# Patient Record
Sex: Female | Born: 1975 | Race: Black or African American | Hispanic: No | Marital: Married | Smoking: Never smoker
Health system: Southern US, Community
[De-identification: ages and names within clinical notes are randomized; demographics above are authoritative.]

## PROBLEM LIST (undated history)

## (undated) DIAGNOSIS — F329 Major depressive disorder, single episode, unspecified: Secondary | ICD-10-CM

## (undated) DIAGNOSIS — D219 Benign neoplasm of connective and other soft tissue, unspecified: Secondary | ICD-10-CM

## (undated) DIAGNOSIS — K5792 Diverticulitis of intestine, part unspecified, without perforation or abscess without bleeding: Secondary | ICD-10-CM

## (undated) DIAGNOSIS — Z9289 Personal history of other medical treatment: Secondary | ICD-10-CM

## (undated) DIAGNOSIS — F419 Anxiety disorder, unspecified: Secondary | ICD-10-CM

## (undated) DIAGNOSIS — E119 Type 2 diabetes mellitus without complications: Secondary | ICD-10-CM

## (undated) DIAGNOSIS — N979 Female infertility, unspecified: Secondary | ICD-10-CM

## (undated) DIAGNOSIS — F32A Depression, unspecified: Secondary | ICD-10-CM

## (undated) DIAGNOSIS — I10 Essential (primary) hypertension: Secondary | ICD-10-CM

## (undated) DIAGNOSIS — D649 Anemia, unspecified: Secondary | ICD-10-CM

## (undated) HISTORY — DX: Morbid (severe) obesity due to excess calories: E66.01

## (undated) HISTORY — DX: Diverticulitis of intestine, part unspecified, without perforation or abscess without bleeding: K57.92

## (undated) HISTORY — DX: Benign neoplasm of connective and other soft tissue, unspecified: D21.9

---

## 1999-06-04 HISTORY — PX: BREAST SURGERY: SHX581

## 2007-06-04 HISTORY — PX: MYOMECTOMY: SHX85

## 2013-08-01 DIAGNOSIS — K5792 Diverticulitis of intestine, part unspecified, without perforation or abscess without bleeding: Secondary | ICD-10-CM

## 2013-08-01 HISTORY — DX: Diverticulitis of intestine, part unspecified, without perforation or abscess without bleeding: K57.92

## 2013-08-08 ENCOUNTER — Emergency Department (HOSPITAL_COMMUNITY): Payer: 59

## 2013-08-08 ENCOUNTER — Encounter (HOSPITAL_COMMUNITY): Payer: Self-pay | Admitting: Emergency Medicine

## 2013-08-08 ENCOUNTER — Emergency Department (HOSPITAL_COMMUNITY)
Admission: EM | Admit: 2013-08-08 | Discharge: 2013-08-08 | Disposition: A | Discharge status: Home / Self Care | Payer: 59 | Attending: Emergency Medicine | Admitting: Emergency Medicine

## 2013-08-08 DIAGNOSIS — I10 Essential (primary) hypertension: Secondary | ICD-10-CM | POA: Insufficient documentation

## 2013-08-08 DIAGNOSIS — Z79899 Other long term (current) drug therapy: Secondary | ICD-10-CM | POA: Insufficient documentation

## 2013-08-08 DIAGNOSIS — E119 Type 2 diabetes mellitus without complications: Secondary | ICD-10-CM | POA: Insufficient documentation

## 2013-08-08 DIAGNOSIS — Z792 Long term (current) use of antibiotics: Secondary | ICD-10-CM | POA: Insufficient documentation

## 2013-08-08 DIAGNOSIS — K5792 Diverticulitis of intestine, part unspecified, without perforation or abscess without bleeding: Secondary | ICD-10-CM

## 2013-08-08 DIAGNOSIS — Z3202 Encounter for pregnancy test, result negative: Secondary | ICD-10-CM | POA: Insufficient documentation

## 2013-08-08 DIAGNOSIS — K5732 Diverticulitis of large intestine without perforation or abscess without bleeding: Secondary | ICD-10-CM | POA: Insufficient documentation

## 2013-08-08 HISTORY — DX: Type 2 diabetes mellitus without complications: E11.9

## 2013-08-08 HISTORY — DX: Essential (primary) hypertension: I10

## 2013-08-08 LAB — URINALYSIS, ROUTINE W REFLEX MICROSCOPIC
Bilirubin Urine: NEGATIVE
GLUCOSE, UA: 250 mg/dL — AB
Hgb urine dipstick: NEGATIVE
KETONES UR: NEGATIVE mg/dL
LEUKOCYTES UA: NEGATIVE
Nitrite: NEGATIVE
Protein, ur: NEGATIVE mg/dL
Specific Gravity, Urine: 1.021 (ref 1.005–1.030)
Urobilinogen, UA: 0.2 mg/dL (ref 0.0–1.0)
pH: 5.5 (ref 5.0–8.0)

## 2013-08-08 LAB — CBC WITH DIFFERENTIAL/PLATELET
BASOS ABS: 0 10*3/uL (ref 0.0–0.1)
Basophils Relative: 0 % (ref 0–1)
Eosinophils Absolute: 0.1 10*3/uL (ref 0.0–0.7)
Eosinophils Relative: 0 % (ref 0–5)
HEMATOCRIT: 35.2 % — AB (ref 36.0–46.0)
HEMOGLOBIN: 11 g/dL — AB (ref 12.0–15.0)
LYMPHS ABS: 2.8 10*3/uL (ref 0.7–4.0)
LYMPHS PCT: 22 % (ref 12–46)
MCH: 24.7 pg — ABNORMAL LOW (ref 26.0–34.0)
MCHC: 31.3 g/dL (ref 30.0–36.0)
MCV: 79.1 fL (ref 78.0–100.0)
MONO ABS: 0.7 10*3/uL (ref 0.1–1.0)
Monocytes Relative: 5 % (ref 3–12)
NEUTROS ABS: 9 10*3/uL — AB (ref 1.7–7.7)
Neutrophils Relative %: 72 % (ref 43–77)
Platelets: 375 10*3/uL (ref 150–400)
RBC: 4.45 MIL/uL (ref 3.87–5.11)
RDW: 16.2 % — AB (ref 11.5–15.5)
WBC: 12.5 10*3/uL — AB (ref 4.0–10.5)

## 2013-08-08 LAB — BASIC METABOLIC PANEL
BUN: 7 mg/dL (ref 6–23)
CO2: 28 meq/L (ref 19–32)
CREATININE: 0.71 mg/dL (ref 0.50–1.10)
Calcium: 9.5 mg/dL (ref 8.4–10.5)
Chloride: 97 mEq/L (ref 96–112)
GFR calc Af Amer: >90 mL/min (ref >90)
GFR calc non Af Amer: >90 mL/min (ref >90)
Glucose, Bld: 152 mg/dL — ABNORMAL HIGH (ref 70–99)
Potassium: 4.4 mEq/L (ref 3.7–5.3)
Sodium: 136 mEq/L — ABNORMAL LOW (ref 137–147)

## 2013-08-08 LAB — PREGNANCY, URINE: PREG TEST UR: NEGATIVE

## 2013-08-08 MED ORDER — CIPROFLOXACIN HCL 500 MG PO TABS: 500.0000 mg | ORAL_TABLET | Freq: Two times a day (BID) | ORAL | Status: DC

## 2013-08-08 MED ORDER — OXYCODONE-ACETAMINOPHEN 5-325 MG PO TABS
2.0000 | ORAL_TABLET | Freq: Once | ORAL | Status: AC
  Administered 2013-08-08: 2 via ORAL
  Filled 2013-08-08: qty 2

## 2013-08-08 MED ORDER — METRONIDAZOLE 500 MG PO TABS
500.0000 mg | ORAL_TABLET | Freq: Once | ORAL | Status: AC
  Administered 2013-08-08: 500 mg via ORAL
  Filled 2013-08-08: qty 1

## 2013-08-08 MED ORDER — IOHEXOL 300 MG/ML  SOLN
100.0000 mL | Freq: Once | INTRAMUSCULAR | Status: AC | PRN
  Administered 2013-08-08: 100 mL via INTRAVENOUS

## 2013-08-08 MED ORDER — MORPHINE SULFATE 4 MG/ML IJ SOLN
4.0000 mg | Freq: Once | INTRAMUSCULAR | Status: AC
  Administered 2013-08-08: 4 mg via INTRAVENOUS
  Filled 2013-08-08: qty 1

## 2013-08-08 MED ORDER — IOHEXOL 300 MG/ML  SOLN
50.0000 mL | Freq: Once | INTRAMUSCULAR | Status: AC | PRN
  Administered 2013-08-08: 50 mL via ORAL

## 2013-08-08 MED ORDER — CIPROFLOXACIN HCL 500 MG PO TABS
500.0000 mg | ORAL_TABLET | Freq: Once | ORAL | Status: AC
  Administered 2013-08-08: 500 mg via ORAL
  Filled 2013-08-08: qty 1

## 2013-08-08 MED ORDER — ONDANSETRON HCL 4 MG/2ML IJ SOLN
4.0000 mg | Freq: Once | INTRAMUSCULAR | Status: AC
  Administered 2013-08-08: 4 mg via INTRAVENOUS
  Filled 2013-08-08: qty 2

## 2013-08-08 MED ORDER — SODIUM CHLORIDE 0.9 % IV BOLUS (SEPSIS)
1000.0000 mL | Freq: Once | INTRAVENOUS | Status: AC
  Administered 2013-08-08: 1000 mL via INTRAVENOUS

## 2013-08-08 MED ORDER — ONDANSETRON HCL 4 MG PO TABS: 4.0000 mg | ORAL_TABLET | Freq: Four times a day (QID) | ORAL | Status: DC

## 2013-08-08 MED ORDER — HYDROCODONE-ACETAMINOPHEN 5-325 MG PO TABS: 1.0000 | ORAL_TABLET | ORAL | Status: DC | PRN

## 2013-08-08 MED ORDER — METRONIDAZOLE 500 MG PO TABS: 500.0000 mg | ORAL_TABLET | Freq: Three times a day (TID) | ORAL | Status: DC

## 2013-08-08 NOTE — Discharge Instructions (Signed)
Diverticulitis °A diverticulum is a small pouch or sac on the colon. Diverticulosis is the presence of these diverticula on the colon. Diverticulitis is the irritation (inflammation) or infection of diverticula. °CAUSES  °The colon and its diverticula contain bacteria. If food particles block the tiny opening to a diverticulum, the bacteria inside can grow and cause an increase in pressure. This leads to infection and inflammation and is called diverticulitis. °SYMPTOMS  °· Abdominal pain and tenderness. Usually, the pain is located on the left side of your abdomen. However, it could be located elsewhere. °· Fever. °· Bloating. °· Feeling sick to your stomach (nausea). °· Throwing up (vomiting). °· Abnormal stools. °DIAGNOSIS  °Your caregiver will take a history and perform a physical exam. Since many things can cause abdominal pain, other tests may be necessary. Tests may include: °· Blood tests. °· Urine tests. °· X-ray of the abdomen. °· CT scan of the abdomen. °Sometimes, surgery is needed to determine if diverticulitis or other conditions are causing your symptoms. °TREATMENT  °Most of the time, you can be treated without surgery. Treatment includes: °· Resting the bowels by only having liquids for a few days. As you improve, you will need to eat a low-fiber diet. °· Intravenous (IV) fluids if you are losing body fluids (dehydrated). °· Antibiotic medicines that treat infections may be given. °· Pain and nausea medicine, if needed. °· Surgery if the inflamed diverticulum has burst. °HOME CARE INSTRUCTIONS  °· Try a clear liquid diet (broth, tea, or water for as long as directed by your caregiver). You may then gradually begin a low-fiber diet as tolerated.  °A low-fiber diet is a diet with less than 10 grams of fiber. Choose the foods below to reduce fiber in the diet: °· Sperry breads, cereals, rice, and pasta. °· Cooked fruits and vegetables or soft fresh fruits and vegetables without the skin. °· Ground or  well-cooked tender beef, ham, veal, lamb, pork, or poultry. °· Eggs and seafood. °· After your diverticulitis symptoms have improved, your caregiver may put you on a high-fiber diet. A high-fiber diet includes 14 grams of fiber for every 1000 calories consumed. For a standard 2000 calorie diet, you would need 28 grams of fiber. Follow these diet guidelines to help you increase the fiber in your diet. It is important to slowly increase the amount fiber in your diet to avoid gas, constipation, and bloating. °· Choose whole-grain breads, cereals, pasta, and brown rice. °· Choose fresh fruits and vegetables with the skin on. Do not overcook vegetables because the more vegetables are cooked, the more fiber is lost. °· Choose more nuts, seeds, legumes, dried peas, beans, and lentils. °· Look for food products that have greater than 3 grams of fiber per serving on the Nutrition Facts label. °· Take all medicine as directed by your caregiver. °· If your caregiver has given you a follow-up appointment, it is very important that you go. Not going could result in lasting (chronic) or permanent injury, pain, and disability. If there is any problem keeping the appointment, call to reschedule. °SEEK MEDICAL CARE IF:  °· Your pain does not improve. °· You have a hard time advancing your diet beyond clear liquids. °· Your bowel movements do not return to normal. °SEEK IMMEDIATE MEDICAL CARE IF:  °· Your pain becomes worse. °· You have an oral temperature above 102° F (38.9° C), not controlled by medicine. °· You have repeated vomiting. °· You have bloody or black, tarry stools. °·   Symptoms that brought you to your caregiver become worse or are not getting better. °MAKE SURE YOU:  °· Understand these instructions. °· Will watch your condition. °· Will get help right away if you are not doing well or get worse. °Document Released: 02/27/2005 Document Revised: 08/12/2011 Document Reviewed: 06/25/2010 °[employer]® Patient Information  ©2014 [employer]. ° °

## 2013-08-08 NOTE — ED Provider Notes (Signed)
Medical screening examination/treatment/procedure(s) were performed by non-physician practitioner and as supervising physician I was immediately available for consultation/collaboration.   EKG Interpretation None        Blanchard Kelch, MD 08/08/13 2129

## 2013-08-08 NOTE — ED Provider Notes (Signed)
CSN: [id number]     Arrival date & time 08/08/13  2725 History   First MD Initiated Contact with Patient 08/08/13 [phone]     Chief Complaint  Patient presents with  . Abdominal Pain     (Consider location/radiation/quality/duration/timing/severity/associated sxs/prior Treatment) HPI  Patient to the ER with complaints of abdominal pains that started yesterday evening. THe pains have been worsening and this morning they woke her out of her sleep. She has recently changed her diet to vegetables, probiotics, fiber, and other healthy foods. She has been experiencing gas, bloating and looser stool. This morning she had a bowel movement but it was very hard and smaller than normal. She feels as though she is constipated and backed up. She has been nauseous but has not vomited. She denies having any fevers and the pain is across her bilateral lower abdomen. Denies dysuria, hematuria, irregular vaginal discharge or bleeding. Pain is currently 7/10.  Past Medical History  Diagnosis Date  . Diabetes mellitus without complication   . Hypertension    Past Surgical History  Procedure Laterality Date  . Breast surgery      reconstruction   History reviewed. No pertinent family history. History  Substance Use Topics  . Smoking status: Never Smoker   . Smokeless tobacco: Not on file  . Alcohol Use: No   OB History   Grav Para Term Preterm Abortions TAB SAB Ect Mult Living                 Review of Systems  The patient denies anorexia, fever, weight loss,, vision loss, decreased hearing, hoarseness, chest pain, syncope, dyspnea on exertion, peripheral edema, balance deficits, hemoptysis, melena, hematochezia, severe indigestion/heartburn, hematuria, incontinence, genital sores, muscle weakness, suspicious skin lesions, transient blindness, difficulty walking, depression, unusual weight change, abnormal bleeding, enlarged lymph nodes, angioedema, and breast masses.   Allergies  Codeine  Home  Medications   Current Outpatient Rx  Name  Route  Sig  Dispense  Refill  . losartan-hydrochlorothiazide (HYZAAR) 100-25 MG per tablet   Oral   Take 1 tablet by mouth daily.         . metFORMIN (GLUCOPHAGE) 1000 MG tablet   Oral   Take 1,000 mg by mouth 2 (two) times daily with a meal.         . OVER THE COUNTER MEDICATION   Oral   Take 1 tablet by mouth daily. Schiff Probiotic Gummy         . ciprofloxacin (CIPRO) 500 MG tablet   Oral   Take 1 tablet (500 mg total) by mouth 2 (two) times daily.   28 tablet   0   . HYDROcodone-acetaminophen (NORCO/VICODIN) 5-325 MG per tablet   Oral   Take 1-2 tablets by mouth every 4 (four) hours as needed.   25 tablet   0   . metroNIDAZOLE (FLAGYL) 500 MG tablet   Oral   Take 1 tablet (500 mg total) by mouth 3 (three) times daily.   42 tablet   0   . ondansetron (ZOFRAN) 4 MG tablet   Oral   Take 1 tablet (4 mg total) by mouth every 6 (six) hours.   12 tablet   0    BP 139/78  Pulse 70  Temp(Src) 98.2 F (36.8 C) (Oral)  Resp 18  Ht 5\' 6"  (1.676 m)  Wt 346 lb (156.945 kg)  BMI 55.87 kg/m2  SpO2 95%  LMP 08/02/2013 Physical Exam  Nursing note and vitals reviewed.  Constitutional: She appears well-developed and well-nourished. No distress.  HENT:  Head: Normocephalic and atraumatic.  Eyes: Pupils are equal, round, and reactive to light.  Neck: Normal range of motion. Neck supple.  Cardiovascular: Normal rate and regular rhythm.   Pulmonary/Chest: Effort normal.  Abdominal: Soft. Bowel sounds are normal. She exhibits no distension and no ascites. There is tenderness in the right lower quadrant, suprapubic area and left lower quadrant. There is no rebound and no guarding.  Exam limited by body habitus (AFTER PAIN MEDICATION PAIN LOCALIZED TO LLQ)  Neurological: She is alert.  Skin: Skin is warm and dry.    ED Course  Procedures (including critical care time) Labs Review Labs Reviewed  CBC WITH DIFFERENTIAL -  Abnormal; Notable for the following:    WBC 12.5 (*)    Hemoglobin 11.0 (*)    HCT 35.2 (*)    MCH 24.7 (*)    RDW 16.2 (*)    Neutro Abs 9.0 (*)    All other components within normal limits  BASIC METABOLIC PANEL - Abnormal; Notable for the following:    Sodium 136 (*)    Glucose, Bld 152 (*)    All other components within normal limits  URINALYSIS, ROUTINE W REFLEX MICROSCOPIC - Abnormal; Notable for the following:    APPearance CLOUDY (*)    Glucose, UA 250 (*)    All other components within normal limits  PREGNANCY, URINE   Imaging Review Ct Abdomen Pelvis W Contrast  08/08/2013   CLINICAL DATA:  Abdominal pain.  EXAM: CT ABDOMEN AND PELVIS WITH CONTRAST  TECHNIQUE: Multidetector CT imaging of the abdomen and pelvis was performed using the standard protocol following bolus administration of intravenous contrast.  CONTRAST:  175mL OMNIPAQUE IOHEXOL 300 MG/ML  SOLN  COMPARISON:  DG ABD 2 VIEWS dated 08/08/2013  FINDINGS: Few patchy densities at the lung bases are suggestive for atelectasis. Negative for free air.  Normal appearance of the liver, portal venous system and gallbladder. Normal appearance of the pancreas, spleen, adrenal glands, stomach and kidneys.  There is a slightly low-density lesion along the anterior uterus. Lesion roughly measures 7.1 x 5.6 x 6.8 cm. Findings are suggestive for a large uterine fibroid. Uterus extends into the lower abdomen. There are low-density structures along the left side of the uterus which appear to be related to the adnexa. Largest low-density structure measures up to 4.2 cm but this also has a tubular configuration and cannot exclude hydrosalpinx. Along the right side of the uterine fundus, there is a heterogeneous lesion that contains fat. The lesion also has a calcification. This lesion measures 3.7 x 2.9 cm and consistent with an ovarian dermoid.  The appendix is adjacent to the dermoid without inflammatory changes. There is pericolonic edema  involving the distal descending colon in the left lower quadrant. There is an indistinct diverticulum in this area and findings are suggestive for acute diverticulitis. There are multiple small diverticula in the left colon. No evidence for an abscess collection. No significant free fluid in the pelvis. Normal appearance of the urinary bladder. No acute bone abnormality.  IMPRESSION: Acute inflammation in the distal descending colon. Findings are most compatible with acute diverticulitis. No evidence for abscess or free fluid.  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  Low-density structures involving the left adnexa could represent ovarian cysts but hydrosalpinx cannot be excluded. The ovarian dermoid and left adnexa could be better characterized with ultrasound.  Probable large fibroid along the anterior aspect  of the uterus.   Electronically Signed   By: Markus Daft M.D.   On: 08/08/2013 10:42   Dg Abd 2 Views  08/08/2013   CLINICAL DATA:  Abdominal pain and cramping.  EXAM: ABDOMEN - 2 VIEW  COMPARISON:  None.  FINDINGS: No evidence for free air. Gas and stool within the colon. No significant small bowel gas. Calcifications in the pelvis are suggestive for phleboliths. Mild degenerative changes in the right hip joint. No large calcifications overlying the renal shadows.  IMPRESSION: Nonspecific bowel gas pattern.   Electronically Signed   By: Markus Daft M.D.   On: 08/08/2013 08:44     EKG Interpretation None      MDM   Final diagnoses:  Diverticulitis    Patients pain easily controlled with pain medication. She looks well, resting calmly in her exam room. She is afebrile, she is not tachycardic, hypoxic, nor does she have any vital sign deficits.   Patient does not need admission at this time. Will put on Flagyl and Cipro for 2 weeks. Give as well as pain and nausea medication.  38 y.o.Olena Vanburen's evaluation in the Emergency Department is complete. It has been determined that no  acute conditions requiring further emergency intervention are present at this time. The patient/guardian have been advised of the diagnosis and plan. We have discussed signs and symptoms that warrant return to the ED, such as changes or worsening in symptoms.  Vital signs are stable at discharge. Filed Vitals:   08/08/13 1040  BP: 139/78  Pulse: 70  Temp:   Resp: 18    Patient/guardian has voiced understanding and agreed to follow-up with the PCP or specialist.       Linus Mako, PA-C 08/08/13 1100

## 2013-08-08 NOTE — ED Notes (Signed)
Pt states that she has been having abdominal pain since yesterday. Pt tried probiotics and still feels like she is bloated after havign a bowel movment and passing gas. Pt states 2 bowel movements today regular but with mucus. Pt also tried ginger ale with no relief.

## 2013-10-11 ENCOUNTER — Emergency Department (HOSPITAL_COMMUNITY)
Admission: EM | Admit: 2013-10-11 | Discharge: 2013-10-11 | Disposition: A | Discharge status: Home / Self Care | Payer: 59 | Source: Home / Self Care | Attending: Family Medicine | Admitting: Family Medicine

## 2013-10-11 ENCOUNTER — Encounter (HOSPITAL_COMMUNITY): Payer: Self-pay | Admitting: Emergency Medicine

## 2013-10-11 DIAGNOSIS — K5900 Constipation, unspecified: Secondary | ICD-10-CM

## 2013-10-11 DIAGNOSIS — K5732 Diverticulitis of large intestine without perforation or abscess without bleeding: Secondary | ICD-10-CM

## 2013-10-11 DIAGNOSIS — K5792 Diverticulitis of intestine, part unspecified, without perforation or abscess without bleeding: Secondary | ICD-10-CM

## 2013-10-11 DIAGNOSIS — R109 Unspecified abdominal pain: Secondary | ICD-10-CM

## 2013-10-11 MED ORDER — METRONIDAZOLE 500 MG PO TABS: 500.0000 mg | ORAL_TABLET | Freq: Three times a day (TID) | ORAL | Status: DC

## 2013-10-11 MED ORDER — POLYETHYLENE GLYCOL 3350 17 GM/SCOOP PO POWD: 17.0000 g | Freq: Every day | ORAL | Status: DC

## 2013-10-11 MED ORDER — CIPROFLOXACIN HCL 500 MG PO TABS: 500.0000 mg | ORAL_TABLET | Freq: Two times a day (BID) | ORAL | Status: DC

## 2013-10-11 NOTE — ED Provider Notes (Signed)
Amy Banks is a 38 y.o. female who presents to Urgent Care today for abdominal pain. Patient developed lower abdominal pain starting yesterday evening. She notes coexisting constipation as well. The pain is mild and consistent with prior episodes of diverticulitis. She denies any fevers or chills nausea vomiting or diarrhea. She tried milk of magnesia to relieve constipation which did not help much. She feels well otherwise. She is currently menstruating.   Past Medical History  Diagnosis Date  . Diabetes mellitus without complication   . Hypertension    History  Substance Use Topics  . Smoking status: Never Smoker   . Smokeless tobacco: Not on file  . Alcohol Use: No   ROS as above Medications: No current facility-administered medications for this encounter.   Current Outpatient Prescriptions  Medication Sig Dispense Refill  . Letrozole (FEMARA PO) Take by mouth.      . losartan-hydrochlorothiazide (HYZAAR) 100-25 MG per tablet Take 1 tablet by mouth daily.      . metFORMIN (GLUCOPHAGE) 1000 MG tablet Take 1,000 mg by mouth 2 (two) times daily with a meal.      . ciprofloxacin (CIPRO) 500 MG tablet Take 1 tablet (500 mg total) by mouth 2 (two) times daily.  28 tablet  0  . HYDROcodone-acetaminophen (NORCO/VICODIN) 5-325 MG per tablet Take 1-2 tablets by mouth every 4 (four) hours as needed.  25 tablet  0  . metroNIDAZOLE (FLAGYL) 500 MG tablet Take 1 tablet (500 mg total) by mouth 3 (three) times daily.  42 tablet  0  . ondansetron (ZOFRAN) 4 MG tablet Take 1 tablet (4 mg total) by mouth every 6 (six) hours.  12 tablet  0  . OVER THE COUNTER MEDICATION Take 1 tablet by mouth daily. Schiff Probiotic Gummy      . polyethylene glycol powder (GLYCOLAX/MIRALAX) powder Take 17 g by mouth daily.  850 g  1    Exam:  BP 121/85  Pulse 66  Temp(Src) 98.4 F (36.9 C) (Oral)  Resp 16  SpO2 98%  LMP 10/08/2013 Gen: Well NAD morbidly obese HEENT: EOMI,  MMM Lungs: Normal work of  breathing. CTABL Heart: RRR no MRG Abd: NABS, Soft. Mildly tender with no rebound or guarding left lower quadrant, ND no CV angle tenderness to percussion Exts: Brisk capillary refill, warm and well perfused.   No results found for this or any previous visit (from the past 24 hour(s)). No results found.  Assessment and Plan: 38 y.o. female with abdominal pain. Concern for diverticulitis versus constipation. Refill antibiotics for diverticulitis. Additionally recommend MiraLAX. Refer to gastroenterology  Discussed warning signs or symptoms. Please see discharge instructions. Patient expresses understanding.    Gregor Hams, MD 10/11/13 [phone]

## 2013-10-11 NOTE — ED Notes (Signed)
Patient c/o diverticulitis, onset last night

## 2013-10-11 NOTE — Discharge Instructions (Signed)
Thank you for coming in today. Restart a clear diet.  Restart antibiotics.  Use condoms when having sex while taking these antibiotics.  Follow up with gastroenterology.   Diverticulitis A diverticulum is a small pouch or sac on the colon. Diverticulosis is the presence of these diverticula on the colon. Diverticulitis is the irritation (inflammation) or infection of diverticula. CAUSES  The colon and its diverticula contain bacteria. If food particles block the tiny opening to a diverticulum, the bacteria inside can grow and cause an increase in pressure. This leads to infection and inflammation and is called diverticulitis. SYMPTOMS   Abdominal pain and tenderness. Usually, the pain is located on the left side of your abdomen. However, it could be located elsewhere.  Fever.  Bloating.  Feeling sick to your stomach (nausea).  Throwing up (vomiting).  Abnormal stools. DIAGNOSIS  Your caregiver will take a history and perform a physical exam. Since many things can cause abdominal pain, other tests may be necessary. Tests may include:  Blood tests.  Urine tests.  X-ray of the abdomen.  CT scan of the abdomen. Sometimes, surgery is needed to determine if diverticulitis or other conditions are causing your symptoms. TREATMENT  Most of the time, you can be treated without surgery. Treatment includes:  Resting the bowels by only having liquids for a few days. As you improve, you will need to eat a low-fiber diet.  Intravenous (IV) fluids if you are losing body fluids (dehydrated).  Antibiotic medicines that treat infections may be given.  Pain and nausea medicine, if needed.  Surgery if the inflamed diverticulum has burst. HOME CARE INSTRUCTIONS   Try a clear liquid diet (broth, tea, or water for as long as directed by your caregiver). You may then gradually begin a low-fiber diet as tolerated.  A low-fiber diet is a diet with less than 10 grams of fiber. Choose the foods  below to reduce fiber in the diet:  Friedmann breads, cereals, rice, and pasta.  Cooked fruits and vegetables or soft fresh fruits and vegetables without the skin.  Ground or well-cooked tender beef, ham, veal, lamb, pork, or poultry.  Eggs and seafood.  After your diverticulitis symptoms have improved, your caregiver may put you on a high-fiber diet. A high-fiber diet includes 14 grams of fiber for every 1000 calories consumed. For a standard 2000 calorie diet, you would need 28 grams of fiber. Follow these diet guidelines to help you increase the fiber in your diet. It is important to slowly increase the amount fiber in your diet to avoid gas, constipation, and bloating.  Choose whole-grain breads, cereals, pasta, and brown rice.  Choose fresh fruits and vegetables with the skin on. Do not overcook vegetables because the more vegetables are cooked, the more fiber is lost.  Choose more nuts, seeds, legumes, dried peas, beans, and lentils.  Look for food products that have greater than 3 grams of fiber per serving on the Nutrition Facts label.  Take all medicine as directed by your caregiver.  If your caregiver has given you a follow-up appointment, it is very important that you go. Not going could result in lasting (chronic) or permanent injury, pain, and disability. If there is any problem keeping the appointment, call to reschedule. SEEK MEDICAL CARE IF:   Your pain does not improve.  You have a hard time advancing your diet beyond clear liquids.  Your bowel movements do not return to normal. SEEK IMMEDIATE MEDICAL CARE IF:   Your pain becomes  worse.  You have an oral temperature above 102 F (38.9 C), not controlled by medicine.  You have repeated vomiting.  You have bloody or black, tarry stools.  Symptoms that brought you to your caregiver become worse or are not getting better. MAKE SURE YOU:   Understand these instructions.  Will watch your condition.  Will get  help right away if you are not doing well or get worse. Document Released: 02/27/2005 Document Revised: 08/12/2011 Document Reviewed: 06/25/2010 [facility] Patient Information 2014 [place].  PRIMARY CARE Paramedic at [place], [place] Ph [phone]  Fax [phone]  Therapist, music at [facility] [address] [place], [place] Ph [phone]  Fax [phone]  Therapist, music at [place] / [place] [phone] [address] [place], [place] Ph [phone]  Fax [phone]  [facility] at [facility] [address], [place]  [place], [place] Ph [phone]  Fax [phone]  [place] [address] [place] [address] [place], [place] Ph [phone]  Fax [phone]  [facility] at [facility] [place], [place] Ph [phone]  Fax [phone]   [place] @ [place] [address] Phone: [phone]   [place] @ [facility] [place]. [place] [address] Phone: [place] @ [place] [address] [place] [address] Phone: [place] @ [place]. [place] [address] Phone: [place] @ [place]. [employer], [place] [address] Phone: [phone]   [place] @ [place] [address] [place] [address] Phone: [phone]

## 2013-10-13 ENCOUNTER — Ambulatory Visit (INDEPENDENT_AMBULATORY_CARE_PROVIDER_SITE_OTHER): Payer: 59 | Admitting: Internal Medicine

## 2013-10-13 ENCOUNTER — Encounter: Payer: Self-pay | Admitting: Internal Medicine

## 2013-10-13 VITALS — BP 118/74 | HR 68 | Ht 66.5 in | Wt 341.0 lb

## 2013-10-13 DIAGNOSIS — D649 Anemia, unspecified: Secondary | ICD-10-CM

## 2013-10-13 DIAGNOSIS — K5732 Diverticulitis of large intestine without perforation or abscess without bleeding: Secondary | ICD-10-CM

## 2013-10-13 MED ORDER — AMOXICILLIN-POT CLAVULANATE 875-125 MG PO TABS: 1.0000 | ORAL_TABLET | Freq: Two times a day (BID) | ORAL | Status: DC

## 2013-10-13 NOTE — Progress Notes (Signed)
Subjective:    Patient ID: Crissie Figures, female    DOB: 09-22-1975, 38 y.o.   MRN: [id number]  HPI The patient is a very nice middle-aged [place] American woman, with left lower and suprapubic abdominal pain. She was diagnosed with sigmoid diverticulitis by CT scan in March. She was treated with Cipro and metronidazole and improved. She began having more problems with abdominal pressure and pain in that area and some constipation and salt urgent medical care 2 days ago. Cipro and metronidazole were prescribed again but she decided not to get that orifice into the wrong pharmacy. She did try some MiraLax for constipation. That hasn't helped too much it. 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 Allergies  Allergen Reactions  . Codeine Swelling   Outpatient Prescriptions Prior to Visit  Medication Sig Dispense Refill  . HYDROcodone-acetaminophen (NORCO/VICODIN) 5-325 MG per tablet Take 1-2 tablets by mouth every 4 (four) hours as needed.  25 tablet  0  . Letrozole (FEMARA PO) Take by mouth.      . losartan-hydrochlorothiazide (HYZAAR) 100-25 MG per tablet Take 1 tablet by mouth daily.      . metFORMIN (GLUCOPHAGE) 1000 MG tablet Take 1,000 mg by mouth 2 (two) times daily with a meal.      . ondansetron (ZOFRAN) 4 MG tablet Take 1 tablet (4 mg total) by mouth every 6 (six) hours.  12 tablet  0  . OVER THE COUNTER MEDICATION Take 1 tablet by mouth daily. Schiff Probiotic Gummy      . polyethylene glycol powder (GLYCOLAX/MIRALAX) powder Take 17 g by mouth daily.  850 g  1  . ciprofloxacin (CIPRO) 500 MG tablet Take 1 tablet (500 mg total) by mouth 2 (two) times daily.  28 tablet  0  . metroNIDAZOLE (FLAGYL) 500 MG tablet Take 1 tablet (500 mg total) by mouth 3 (three) times  daily.  42 tablet  0   No facility-administered medications prior to visit.   Past Medical History  Diagnosis Date  . Diabetes mellitus without complication   . Hypertension   . Diverticulitis 08/2013   Past Surgical History  Procedure Laterality Date  . Breast surgery      reconstruction  . Myomectomy     History   Social History  . Marital Status: Married    Spouse Name: N/A    Number of Children: 0   Social History Main Topics  . Smoking status: Never Smoker   . Smokeless tobacco: Never Used  . Alcohol Use: No  . 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 Hypertension    . Diabetes    . Breast cancer    . Ovarian cancer      Review of Systems Positive for headaches all other view of systems negative or as per history of present illness    Objective:   Physical Exam General:  Well-developed, well-nourished and in no acute distress - obese Eyes:  anicteric. ENT:   Mouth and posterior pharynx free of lesions.  Neck:   supple w/o thyromegaly or  mass.  Lungs: Clear to auscultation bilaterally. Heart:  S1S2, no rubs, murmurs, gallops. Abdomen:  soft, slightly tender suprapubic area, no hepatosplenomegaly, hernia, or mass and BS+.  Lymph:  no cervical or supraclavicular adenopathy. Extremities:   no edema Skin   no rash. + tattoos Neuro:  A&O x 3.  Psych:  appropriate mood and  Affect.   Data Reviewed:  Urgent care emergency room notes from 2015, CT scan abdomen and pelvis, labs Lab Results  Component Value Date   WBC 12.5* 08/08/2013   HGB 11.0* 08/08/2013   HCT 35.2* 08/08/2013   MCV 79.1 08/08/2013   PLT 375 08/08/2013      Assessment & Plan:  Diverticulitis of colon without hemorrhage This was confirmed on CT scan in March, and it sounds like she has recurrence of the  same symptoms that are probably not coming from her gynecologic abnormalities. I'm going to treat her with Augmentin 875 mg twice a day for 2 weeks. She will return in 6 weeks. If there are persistent problems may need repeat imaging. Sigmoidoscopy might be needed but is not definitely indicated. The same for colonoscopy.  She knows to call back sooner than her appointment time if she is not improved. She will use MiraLax as needed and a low  fiber diet for now.  Anemia Mild anemia normocytic to slightly microcytic. Repeat labs when she returns.   CC: Laurel Dimmer, MD and Vladimir Crofts, MD

## 2013-10-13 NOTE — Assessment & Plan Note (Signed)
This was confirmed on CT scan in March, and it sounds like she has recurrence of the same symptoms that are probably not coming from her gynecologic abnormalities. I'm going to treat her with Augmentin 875 mg twice a day for 2 weeks. She will return in 6 weeks. If there are persistent problems may need repeat imaging. Sigmoidoscopy might be needed but is not definitely indicated. The same for colonoscopy.  He knows to call back sooner than her appointment time if she is not improved. She will use MiraLax as needed and a low  fiber diet for now.

## 2013-10-13 NOTE — Patient Instructions (Signed)
Today you have been given a handout to read on diverticulosis and diverticulitis.  We have sent the following medications to your pharmacy for you to pick up at your convenience: Augmentin  If you don't feel better after the round of antibiotics call us back.   Follow up with [place] in 6 weeks.    I appreciate the opportunity to care for you.

## 2013-10-13 NOTE — Assessment & Plan Note (Signed)
Mild anemia normocytic to slightly microcytic. Repeat labs when she returns.

## 2013-12-06 ENCOUNTER — Telehealth: Payer: Self-pay | Admitting: Internal Medicine

## 2013-12-06 DIAGNOSIS — K5732 Diverticulitis of large intestine without perforation or abscess without bleeding: Secondary | ICD-10-CM

## 2013-12-06 MED ORDER — AMOXICILLIN-POT CLAVULANATE 875-125 MG PO TABS: 1.0000 | ORAL_TABLET | Freq: Two times a day (BID) | ORAL | Status: DC

## 2013-12-06 NOTE — Telephone Encounter (Signed)
Calling to report that yesterday afternoon, she began to have the same symptoms she has had in the past with diverticulitis. Felt abdominal pressure and pain. She felt constipated so she took Miralax with some relief. Continues to have some pressure feeling and tenderness in abdomen. She does not have any appetite. She vomited x 1 yesterday. She felt hot yesterday. She will do clear liquids only today. Please, advise.

## 2013-12-06 NOTE — Telephone Encounter (Signed)
Rx sent. Patient notified of recommendations. 

## 2013-12-06 NOTE — Telephone Encounter (Signed)
OK Refill the Augmentin on med hx  She sees me Friday  Stay on liquids - soft/low fiber  Call back sooner if worse, not better

## 2013-12-08 ENCOUNTER — Telehealth: Payer: Self-pay | Admitting: Internal Medicine

## 2013-12-08 NOTE — Telephone Encounter (Signed)
Patient reports that she has continued abdominal pain despite resuming Augmentin on Monday for diarrhea.  She has a low grade fever of 99.6 with shaking chills.  She has been having diarrhea and stools after her liquid diet.  She is having diarrhea "once or twice a day", no further vomiting (see phone note from 12/06/13).  She is advised that may need more time for antibiotics to work.  Should she try Florastor or other pro-biotic?  She is asked to keep appt for Friday.

## 2013-12-10 ENCOUNTER — Other Ambulatory Visit (INDEPENDENT_AMBULATORY_CARE_PROVIDER_SITE_OTHER): Payer: 59

## 2013-12-10 ENCOUNTER — Ambulatory Visit (INDEPENDENT_AMBULATORY_CARE_PROVIDER_SITE_OTHER): Payer: 59 | Admitting: Internal Medicine

## 2013-12-10 ENCOUNTER — Encounter: Payer: Self-pay | Admitting: Internal Medicine

## 2013-12-10 VITALS — BP 124/62 | HR 68 | Ht 66.5 in | Wt 338.0 lb

## 2013-12-10 DIAGNOSIS — D649 Anemia, unspecified: Secondary | ICD-10-CM

## 2013-12-10 DIAGNOSIS — K5732 Diverticulitis of large intestine without perforation or abscess without bleeding: Secondary | ICD-10-CM

## 2013-12-10 LAB — CBC WITH DIFFERENTIAL/PLATELET
BASOS PCT: 0.4 % (ref 0.0–3.0)
Basophils Absolute: 0 10*3/uL (ref 0.0–0.1)
Eosinophils Absolute: 0.2 10*3/uL (ref 0.0–0.7)
Eosinophils Relative: 2.6 % (ref 0.0–5.0)
HCT: 33.3 % — ABNORMAL LOW (ref 36.0–46.0)
HEMOGLOBIN: 10.3 g/dL — AB (ref 12.0–15.0)
LYMPHS PCT: 42.5 % (ref 12.0–46.0)
Lymphs Abs: 3.7 10*3/uL (ref 0.7–4.0)
MCHC: 30.9 g/dL (ref 30.0–36.0)
MCV: 80.5 fl (ref 78.0–100.0)
MONOS PCT: 5 % (ref 3.0–12.0)
Monocytes Absolute: 0.4 10*3/uL (ref 0.1–1.0)
NEUTROS ABS: 4.4 10*3/uL (ref 1.4–7.7)
Neutrophils Relative %: 49.5 % (ref 43.0–77.0)
Platelets: 471 10*3/uL — ABNORMAL HIGH (ref 150.0–400.0)
RBC: 4.14 Mil/uL (ref 3.87–5.11)
RDW: 16.5 % — ABNORMAL HIGH (ref 11.5–15.5)
WBC: 8.8 10*3/uL (ref 4.0–10.5)

## 2013-12-10 LAB — FERRITIN: Ferritin: 7.6 ng/mL — ABNORMAL LOW (ref 10.0–291.0)

## 2013-12-10 MED ORDER — HYDROCODONE-ACETAMINOPHEN 5-325 MG PO TABS: 1.0000 | ORAL_TABLET | ORAL | Status: DC | PRN

## 2013-12-10 NOTE — Assessment & Plan Note (Signed)
CBC and ferritin, probably menstrual in origin

## 2013-12-10 NOTE — Patient Instructions (Addendum)
Your physician has requested that you go to the basement for the following lab work before leaving today: CBC, Ferritin  You have been scheduled for a CT scan of the abdomen and pelvis at [facility] are scheduled on 12/16/13 at [place] should arrive 15 minutes prior to your appointment time for registration. Please follow the written instructions below on the day of your exam:  1) Do not eat or drink anything after 4:00am (4 hours prior to your test) 2) You have been given 2 bottles of oral contrast to drink. The solution may taste  better if refrigerated, but do NOT add ice or any other liquid to this solution. Shake well before drinking.    Drink 1 bottle of contrast @ 6:00am (2 hours prior to your exam)  Drink 1 bottle of contrast @ 7:00am (1 hour prior to your exam)  You may take any medications as prescribed with a small amount of water except for the following: Metformin, Glucophage, Glucovance, Avandamet, Riomet, Fortamet, Actoplus Met, Janumet, Glumetza or Metaglip. The above medications must be held the day of the exam AND 48 hours after the exam.  The purpose of you drinking the oral contrast is to aid in the visualization of your intestinal tract. The contrast solution may cause some diarrhea. Before your exam is started, you will be given a small amount of fluid to drink. Depending on your individual set of symptoms, you may also receive an intravenous injection of x-ray contrast/dye. This test typically takes 30-45 minutes to complete.  ________________________________________________________________________  Today you have been given a written rx for vicodin to take to your pharmacy.   I appreciate the opportunity to care for you.

## 2013-12-10 NOTE — Progress Notes (Signed)
   Subjective:    Patient ID: Amy Banks, female    DOB: [birth year], 38 y.o.   MRN: [id number]  HPI The patient is here for followup. I had seen her in the late spring, after she had diverticulitis. Proven by CT. She was treated with antibiotics and seemed to resolve but lately she started having left lower quadrant pain and loose stools like she did with her previous episodes. She also has fibroids and a history of hydrosalpinx. She is better after a few days on Augmentin. Appetite is off on that medication she is using a probiotic to try to help of side effects. She also has a mild anemia. Menses her heavy period she is currently menstruating. She's continue to follow with her fertility specialist. Medications, allergies, past medical history, past surgical history, family history and social history are reviewed and updated in the EMR.   Review of Systems As above    Objective:   Physical Exam She is a very nice obese black woman in no acute distress Abdomen is obese soft and mildly tender to palpation in the left lower quadrant and suprapubic areas.    Assessment & Plan:   Diverticulitis of colon without hemorrhage Sxs c/w recurrence per her hx Improving on Augmentin Recheck CT abd and pelvis with contrast Refill hydrocodone/APAP  Anemia CBC and ferritin, probably menstrual in origin   Current outpatient prescriptions:amoxicillin-clavulanate (AUGMENTIN) 875-125 MG per tablet, Take 1 tablet by mouth 2 (two) times daily., Disp: 28 tablet, Rfl: 0;  HYDROcodone-acetaminophen (NORCO/VICODIN) 5-325 MG per tablet, Take 1-2 tablets by mouth every 4 (four) hours as needed., Disp: 25 tablet, Rfl: 0;  Letrozole (FEMARA PO), Take by mouth., Disp: , Rfl:  losartan-hydrochlorothiazide (HYZAAR) 100-25 MG per tablet, Take 1 tablet by mouth daily., Disp: , Rfl: ;  metFORMIN (GLUCOPHAGE) 1000 MG tablet, Take 1,000 mg by mouth 2 (two) times daily with a meal., Disp: , Rfl: ;  ondansetron (ZOFRAN) 4  MG tablet, Take 1 tablet (4 mg total) by mouth every 6 (six) hours., Disp: 12 tablet, Rfl: 0;  OVER THE COUNTER MEDICATION, Take 1 tablet by mouth daily. Schiff Probiotic Gummy, Disp: , Rfl:  polyethylene glycol powder (GLYCOLAX/MIRALAX) powder, Take 17 g by mouth daily., Disp: 850 g, Rfl: 1  CC: Laurel Dimmer, MD

## 2013-12-10 NOTE — Assessment & Plan Note (Signed)
Sxs c/w recurrence per her hx Improving on Augmentin Recheck CT abd and pelvis with contrast Refill hydrocodone/APAP

## 2013-12-12 NOTE — Progress Notes (Signed)
Quick Note:  Iron low - having CT and will notify when that is resulted ______

## 2013-12-16 ENCOUNTER — Ambulatory Visit (HOSPITAL_COMMUNITY): Payer: 59

## 2013-12-16 NOTE — Progress Notes (Signed)
Quick Note:  Thought she was having a CT? Should be scheduling - see my last note Labs show low iron and anemia Needs to be on ferrous sulfate 325 mg bid ______

## 2013-12-22 ENCOUNTER — Encounter (HOSPITAL_COMMUNITY): Payer: Self-pay

## 2013-12-22 ENCOUNTER — Ambulatory Visit (HOSPITAL_COMMUNITY)
Admission: RE | Admit: 2013-12-22 | Discharge: 2013-12-22 | Disposition: A | Discharge status: Home / Self Care | Payer: 59 | Source: Ambulatory Visit | Attending: Internal Medicine | Admitting: Internal Medicine

## 2013-12-22 ENCOUNTER — Other Ambulatory Visit: Payer: Self-pay

## 2013-12-22 ENCOUNTER — Telehealth: Payer: Self-pay

## 2013-12-22 DIAGNOSIS — K5732 Diverticulitis of large intestine without perforation or abscess without bleeding: Secondary | ICD-10-CM

## 2013-12-22 DIAGNOSIS — R109 Unspecified abdominal pain: Secondary | ICD-10-CM | POA: Insufficient documentation

## 2013-12-22 DIAGNOSIS — R509 Fever, unspecified: Secondary | ICD-10-CM | POA: Insufficient documentation

## 2013-12-22 DIAGNOSIS — R197 Diarrhea, unspecified: Secondary | ICD-10-CM | POA: Insufficient documentation

## 2013-12-22 LAB — HCG, QUANTITATIVE, PREGNANCY: hCG, Beta Chain, Quant, S: <1 m[IU]/mL (ref <5)

## 2013-12-22 LAB — CREATININE, SERUM
Creatinine, Ser: 0.73 mg/dL (ref 0.50–1.10)
GFR calc Af Amer: >90 mL/min (ref >90)

## 2013-12-22 MED ORDER — IOHEXOL 300 MG/ML  SOLN
100.0000 mL | Freq: Once | INTRAMUSCULAR | Status: AC | PRN
  Administered 2013-12-22: 100 mL via INTRAVENOUS

## 2013-12-22 NOTE — Telephone Encounter (Signed)
Tommy in [place] had patient with him ready for CT but needed an Istat/Creatine because patient is diabetic and it had not been ordered.  He took a verbal from me that it was ok to perform this lab.  I will communicated this to Dr. Carlean Purl and his [place]

## 2013-12-24 NOTE — Progress Notes (Signed)
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

## 2013-12-27 ENCOUNTER — Telehealth: Payer: Self-pay | Admitting: Internal Medicine

## 2013-12-27 NOTE — Telephone Encounter (Signed)
CT faxed.

## 2014-05-12 ENCOUNTER — Telehealth: Payer: Self-pay | Admitting: Internal Medicine

## 2014-05-12 NOTE — Telephone Encounter (Signed)
I have left a message for the patient to call back Dr. Carlean Purl she does have a history of diverticulitis, but last office visit she had symptoms of LLQ pain, but CT was negative for diverticulitis.  Do you want me to get a symptom update when she calls back to possibly treat over the phone or have her seen in the office tomorrow? You and Apps have openings

## 2014-05-12 NOTE — Telephone Encounter (Signed)
OV Will want to see if she f/u GYN

## 2014-05-12 NOTE — Telephone Encounter (Signed)
Patient is notified of the appt date and time for tomorrow at 9:45

## 2014-05-13 ENCOUNTER — Ambulatory Visit: Payer: 59 | Admitting: Internal Medicine

## 2014-05-17 ENCOUNTER — Emergency Department (HOSPITAL_COMMUNITY)
Admission: EM | Admit: 2014-05-17 | Discharge: 2014-05-17 | Disposition: A | Discharge status: Home / Self Care | Payer: 59 | Source: Home / Self Care | Attending: Family Medicine | Admitting: Family Medicine

## 2014-05-17 ENCOUNTER — Emergency Department (INDEPENDENT_AMBULATORY_CARE_PROVIDER_SITE_OTHER): Payer: 59

## 2014-05-17 ENCOUNTER — Encounter (HOSPITAL_COMMUNITY): Payer: Self-pay | Admitting: *Deleted

## 2014-05-17 DIAGNOSIS — S93602A Unspecified sprain of left foot, initial encounter: Secondary | ICD-10-CM

## 2014-05-17 NOTE — ED Notes (Signed)
Pt  Has  Pain  And   Swelling  Of  The  l  Foot   She  denys   Any  specefic       Injury     She    denys  Any  specefic  Injury           She  Reports    Pain  On  Weight  Bearing         She  Is  A  Diabetic   And has  htn

## 2014-05-17 NOTE — ED Provider Notes (Signed)
CSN: [id number]     Arrival date & time 05/17/14  1144 History   First MD Initiated Contact with Patient 05/17/14 1218     Chief Complaint  Patient presents with  . Foot Pain   (Consider location/radiation/quality/duration/timing/severity/associated sxs/prior Treatment) Patient is a 38 y.o. female presenting with lower extremity pain. The history is provided by the patient.  Foot Pain This is a new problem. The current episode started yesterday. The problem has not changed since onset.Pertinent negatives include no chest pain and no abdominal pain. The symptoms are aggravated by walking.    Past Medical History  Diagnosis Date  . Diabetes mellitus without complication   . Hypertension   . Diverticulitis 08/2013   Past Surgical History  Procedure Laterality Date  . Breast surgery      reconstruction  . Myomectomy     Family History  Problem Relation Age of Onset  . Hypertension    . Diabetes    . Breast cancer    . Ovarian cancer     History  Substance Use Topics  . Smoking status: Never Smoker   . Smokeless tobacco: Never Used  . Alcohol Use: No   OB History    No data available     Review of Systems  Constitutional: Negative.   Cardiovascular: Negative for chest pain.  Gastrointestinal: Negative for abdominal pain.  Musculoskeletal: Positive for joint swelling and gait problem.  Skin: Negative.     Allergies  Codeine  Home Medications   Prior to Admission medications   Medication Sig Start Date End Date Taking? Authorizing Provider  amoxicillin-clavulanate (AUGMENTIN) 875-125 MG per tablet Take 1 tablet by mouth 2 (two) times daily. 12/06/13   Gatha Mayer, MD  HYDROcodone-acetaminophen (NORCO/VICODIN) 5-325 MG per tablet Take 1-2 tablets by mouth every 4 (four) hours as needed. 12/10/13   Gatha Mayer, MD  Letrozole (FEMARA PO) Take by mouth.    Historical Provider, MD  losartan-hydrochlorothiazide (HYZAAR) 100-25 MG per tablet Take 1 tablet by mouth  daily.    Historical Provider, MD  metFORMIN (GLUCOPHAGE) 1000 MG tablet Take 1,000 mg by mouth 2 (two) times daily with a meal.    Historical Provider, MD  ondansetron (ZOFRAN) 4 MG tablet Take 1 tablet (4 mg total) by mouth every 6 (six) hours. 08/08/13   Tiffany Marilu Favre, PA-C  OVER THE COUNTER MEDICATION Take 1 tablet by mouth daily. Schiff Probiotic Gummy    Historical Provider, MD  polyethylene glycol powder (GLYCOLAX/MIRALAX) powder Take 17 g by mouth daily. 10/11/13   Gregor Hams, MD   BP 153/92 mmHg  Pulse 73  Temp(Src) 98.8 F (37.1 C)  Resp 16  SpO2 99%  LMP 05/11/2014 Physical Exam  Constitutional: She is oriented to person, place, and time. She appears well-developed and well-nourished.  Musculoskeletal: She exhibits tenderness.       Left foot: There is tenderness, bony tenderness and swelling. There is normal capillary refill and no deformity.  Neurological: She is alert and oriented to person, place, and time.  Skin: Skin is warm and dry.  Nursing note and vitals reviewed.   ED Course  Procedures (including critical care time) Labs Review Labs Reviewed - No data to display  Imaging Review Dg Foot Complete Left  05/17/2014   CLINICAL DATA:  Atraumatic pain and swelling of the left foot  EXAM: LEFT FOOT - COMPLETE 3+ VIEW  COMPARISON:  None.  FINDINGS: The bones of the left foot are adequately mineralized.  There is no acute fracture nor dislocation. There is no evidence of a stress reaction or fracture. There is no significant degenerative change. There is soft tissue swelling over the midfoot. No definite soft tissue gas is demonstrated nor is there a radiopaque foreign body.  IMPRESSION: No acute bony abnormality of the left foot is demonstrated. There is significant soft tissue swelling over the midfoot dorsally.   Electronically Signed   By: David  Martinique   On: 05/17/2014 13:12     MDM   1. Foot sprain, left, initial encounter        Billy Fischer,  MD 05/17/14 1339

## 2014-05-17 NOTE — Discharge Instructions (Signed)
Wear shoe and activity as tolerated, warm soak twice a day, advil as needed. See orthopedist if further problems.

## 2014-05-19 ENCOUNTER — Other Ambulatory Visit (HOSPITAL_COMMUNITY): Payer: Self-pay | Admitting: Orthopedic Surgery

## 2014-05-19 ENCOUNTER — Ambulatory Visit (HOSPITAL_COMMUNITY)
Admission: RE | Admit: 2014-05-19 | Discharge: 2014-05-19 | Disposition: A | Discharge status: Home / Self Care | Payer: 59 | Source: Ambulatory Visit | Attending: Orthopedic Surgery | Admitting: Orthopedic Surgery

## 2014-05-19 DIAGNOSIS — M79662 Pain in left lower leg: Secondary | ICD-10-CM | POA: Insufficient documentation

## 2014-05-19 NOTE — Progress Notes (Signed)
VASCULAR LAB PRELIMINARY     LLEV doppler completed.    Preliminary report: Negative for DVT.  Lindwood Coke, RVT 05/19/2014, 1:51 PM

## 2014-06-01 ENCOUNTER — Ambulatory Visit: Payer: Self-pay | Admitting: Podiatry

## 2014-08-21 ENCOUNTER — Telehealth: Payer: Self-pay | Admitting: Internal Medicine

## 2014-08-21 NOTE — Telephone Encounter (Signed)
Patient called complaining of lower abdominal pain and constipation. Has a history of documented diverticulitis (CT) and documented no diverticulitis (-CT). Had GYN evaluation as recommended (incidental fibroid per pt). Has Augmentin at home, which she started. Recommended to use Miralax to get bowels moving and continue antibiotic for now. If no better, told to call office tomorrow to arrange evaluation. Obviously, for severe pain (not having currently) go to ER. Forwarded note to Dr. Carlean Purl and his nurse

## 2014-08-22 NOTE — Telephone Encounter (Signed)
Left message for patient to call back  

## 2014-08-22 NOTE — Telephone Encounter (Signed)
I think she should see me or an App in April, sooner prn

## 2014-08-22 NOTE — Telephone Encounter (Signed)
Patient states that she is feeling better and she is scheduled for follow up with Dr. Carlean Purl on 09/13/14 11:15.  She will call back if she has new symptoms prior.

## 2014-09-13 ENCOUNTER — Ambulatory Visit: Payer: 59 | Admitting: Internal Medicine

## 2014-09-14 NOTE — Progress Notes (Unsigned)
Patient ID: Amy Banks, female   DOB: 1975-10-15, 39 y.o.   MRN: [id number] We noticed you missed your appointment. Doctor Silvano Rusk Date 09/13/14 Time11:15AM Your doctor hs recommended that you return to the practice so that effective healthcare can be provided to you.  Please contact us at [phone] to schedule another appointment.  Thank you.

## 2014-10-16 ENCOUNTER — Emergency Department (HOSPITAL_COMMUNITY): Payer: 59

## 2014-10-16 ENCOUNTER — Encounter (HOSPITAL_COMMUNITY): Payer: Self-pay | Admitting: Family Medicine

## 2014-10-16 ENCOUNTER — Emergency Department (HOSPITAL_COMMUNITY)
Admission: EM | Admit: 2014-10-16 | Discharge: 2014-10-17 | Disposition: A | Discharge status: Home / Self Care | Payer: 59 | Attending: Emergency Medicine | Admitting: Emergency Medicine

## 2014-10-16 DIAGNOSIS — R52 Pain, unspecified: Secondary | ICD-10-CM

## 2014-10-16 DIAGNOSIS — R102 Pelvic and perineal pain: Secondary | ICD-10-CM

## 2014-10-16 DIAGNOSIS — E119 Type 2 diabetes mellitus without complications: Secondary | ICD-10-CM | POA: Diagnosis not present

## 2014-10-16 DIAGNOSIS — R1030 Lower abdominal pain, unspecified: Secondary | ICD-10-CM | POA: Diagnosis present

## 2014-10-16 DIAGNOSIS — Z79899 Other long term (current) drug therapy: Secondary | ICD-10-CM | POA: Insufficient documentation

## 2014-10-16 DIAGNOSIS — Z792 Long term (current) use of antibiotics: Secondary | ICD-10-CM | POA: Diagnosis not present

## 2014-10-16 DIAGNOSIS — Z8719 Personal history of other diseases of the digestive system: Secondary | ICD-10-CM | POA: Diagnosis not present

## 2014-10-16 DIAGNOSIS — N939 Abnormal uterine and vaginal bleeding, unspecified: Secondary | ICD-10-CM | POA: Diagnosis not present

## 2014-10-16 DIAGNOSIS — I10 Essential (primary) hypertension: Secondary | ICD-10-CM | POA: Diagnosis not present

## 2014-10-16 LAB — URINALYSIS, ROUTINE W REFLEX MICROSCOPIC
BILIRUBIN URINE: NEGATIVE
Glucose, UA: 100 mg/dL — AB
Ketones, ur: NEGATIVE mg/dL
LEUKOCYTES UA: NEGATIVE
NITRITE: NEGATIVE
PH: 5 (ref 5.0–8.0)
Protein, ur: NEGATIVE mg/dL
Specific Gravity, Urine: 1.02 (ref 1.005–1.030)
UROBILINOGEN UA: 0.2 mg/dL (ref 0.0–1.0)

## 2014-10-16 LAB — CBC WITH DIFFERENTIAL/PLATELET
Basophils Absolute: 0 10*3/uL (ref 0.0–0.1)
Basophils Relative: 0 % (ref 0–1)
Eosinophils Absolute: 0.1 10*3/uL (ref 0.0–0.7)
Eosinophils Relative: 1 % (ref 0–5)
HCT: 32.6 % — ABNORMAL LOW (ref 36.0–46.0)
HEMOGLOBIN: 9.2 g/dL — AB (ref 12.0–15.0)
LYMPHS ABS: 3.2 10*3/uL (ref 0.7–4.0)
Lymphocytes Relative: 38 % (ref 12–46)
MCH: 19 pg — AB (ref 26.0–34.0)
MCHC: 28.2 g/dL — ABNORMAL LOW (ref 30.0–36.0)
MCV: 67.2 fL — ABNORMAL LOW (ref 78.0–100.0)
Monocytes Absolute: 0.4 10*3/uL (ref 0.1–1.0)
Monocytes Relative: 5 % (ref 3–12)
NEUTROS PCT: 56 % (ref 43–77)
Neutro Abs: 4.7 10*3/uL (ref 1.7–7.7)
Platelets: 529 10*3/uL — ABNORMAL HIGH (ref 150–400)
RBC: 4.85 MIL/uL (ref 3.87–5.11)
RDW: 18.8 % — ABNORMAL HIGH (ref 11.5–15.5)
WBC: 8.5 10*3/uL (ref 4.0–10.5)

## 2014-10-16 LAB — WET PREP, GENITAL
Clue Cells Wet Prep HPF POC: NONE SEEN
TRICH WET PREP: NONE SEEN
YEAST WET PREP: NONE SEEN

## 2014-10-16 LAB — COMPREHENSIVE METABOLIC PANEL
ALT: 11 U/L — ABNORMAL LOW (ref 14–54)
AST: 17 U/L (ref 15–41)
Albumin: 3.9 g/dL (ref 3.5–5.0)
Alkaline Phosphatase: 76 U/L (ref 38–126)
Anion gap: 9 (ref 5–15)
BILIRUBIN TOTAL: 0.2 mg/dL — AB (ref 0.3–1.2)
CHLORIDE: 101 mmol/L (ref 101–111)
CO2: 26 mmol/L (ref 22–32)
CREATININE: 0.74 mg/dL (ref 0.44–1.00)
Calcium: 9.3 mg/dL (ref 8.9–10.3)
Glucose, Bld: 118 mg/dL — ABNORMAL HIGH (ref 65–99)
Potassium: 3.7 mmol/L (ref 3.5–5.1)
Sodium: 136 mmol/L (ref 135–145)
TOTAL PROTEIN: 7.8 g/dL (ref 6.5–8.1)

## 2014-10-16 LAB — URINE MICROSCOPIC-ADD ON

## 2014-10-16 LAB — I-STAT BETA HCG BLOOD, ED (MC, WL, AP ONLY): I-stat hCG, quantitative: <5 m[IU]/mL (ref <5)

## 2014-10-16 MED ORDER — HYDROMORPHONE HCL 1 MG/ML IJ SOLN
1.0000 mg | Freq: Once | INTRAMUSCULAR | Status: AC
  Administered 2014-10-16: 1 mg via INTRAVENOUS
  Filled 2014-10-16: qty 1

## 2014-10-16 MED ORDER — HYDROMORPHONE HCL 1 MG/ML IJ SOLN
1.0000 mg | Freq: Once | INTRAMUSCULAR | Status: AC
Start: 2014-10-16 — End: 2014-10-16
  Administered 2014-10-16: 1 mg via INTRAVENOUS
  Filled 2014-10-16: qty 1

## 2014-10-16 MED ORDER — SODIUM CHLORIDE 0.9 % IV BOLUS (SEPSIS)
1000.0000 mL | Freq: Once | INTRAVENOUS | Status: AC
  Administered 2014-10-16: 1000 mL via INTRAVENOUS

## 2014-10-16 MED ORDER — ONDANSETRON HCL 4 MG/2ML IJ SOLN: 4.0000 mg | Freq: Once | INTRAMUSCULAR | Status: DC

## 2014-10-16 MED ORDER — MORPHINE SULFATE 4 MG/ML IJ SOLN: 4.0000 mg | Freq: Once | INTRAMUSCULAR | Status: DC

## 2014-10-16 MED ORDER — ONDANSETRON HCL 4 MG/2ML IJ SOLN
4.0000 mg | Freq: Once | INTRAMUSCULAR | Status: AC
  Administered 2014-10-16: 4 mg via INTRAVENOUS
  Filled 2014-10-16: qty 2

## 2014-10-16 MED ORDER — IOHEXOL 300 MG/ML  SOLN
25.0000 mL | Freq: Once | INTRAMUSCULAR | Status: AC | PRN
  Administered 2014-10-16: 25 mL via ORAL

## 2014-10-16 MED ORDER — IOHEXOL 300 MG/ML  SOLN
100.0000 mL | Freq: Once | INTRAMUSCULAR | Status: AC | PRN
  Administered 2014-10-16: 100 mL via INTRAVENOUS

## 2014-10-16 NOTE — ED Provider Notes (Signed)
CSN: [id number]     Arrival date & time 10/16/14  1520 History   First MD Initiated Contact with Patient 10/16/14 1539     Chief Complaint  Patient presents with  . Abdominal Pain     (Consider location/radiation/quality/duration/timing/severity/associated sxs/prior Treatment) HPI   Pt with hx diabetes, obesity, diverticulitis p/w sharp/crampy lower abdominal pain, vaginal bleeding, N/V, and chills that began this morning.  The vaginal bleeding began yesterday with mild cramping only.  Today she is passing both blood and clots from the vagina.  The pain is similar to prior bouts of diverticulitis.  Has taken tylenol without improvement.  LMP mid March.  She did have some brown vaginal discharge last month during the time of her period, was seen by her gyn Dr Carren Rang who did testing, US showed increased size of known fibroid. She was given medication to start her period but she did not take it.  Denies CP, SOB, urinary symptoms, diarrhea, constipation, blood in her stool.  Last BM was this morning and was normal.  Only abdominal surgery is myomectomy.    Past Medical History  Diagnosis Date  . Diabetes mellitus without complication   . Hypertension   . Diverticulitis 08/2013   Past Surgical History  Procedure Laterality Date  . Breast surgery      reconstruction  . Myomectomy     Family History  Problem Relation Age of Onset  . Hypertension    . Diabetes    . Breast cancer    . Ovarian cancer     History  Substance Use Topics  . Smoking status: Never Smoker   . Smokeless tobacco: Never Used  . Alcohol Use: No   OB History    No data available     Review of Systems  All other systems reviewed and are negative.     Allergies  Codeine  Home Medications   Prior to Admission medications   Medication Sig Start Date End Date Taking? Authorizing Provider  amoxicillin-clavulanate (AUGMENTIN) 875-125 MG per tablet Take 1 tablet by mouth 2 (two) times daily. 12/06/13   Gatha Mayer, MD  HYDROcodone-acetaminophen (NORCO/VICODIN) 5-325 MG per tablet Take 1-2 tablets by mouth every 4 (four) hours as needed. 12/10/13   Gatha Mayer, MD  Letrozole (FEMARA PO) Take by mouth.    Historical Provider, MD  losartan-hydrochlorothiazide (HYZAAR) 100-25 MG per tablet Take 1 tablet by mouth daily.    Historical Provider, MD  metFORMIN (GLUCOPHAGE) 1000 MG tablet Take 1,000 mg by mouth 2 (two) times daily with a meal.    Historical Provider, MD  ondansetron (ZOFRAN) 4 MG tablet Take 1 tablet (4 mg total) by mouth every 6 (six) hours. 08/08/13   Tiffany Carlota Raspberry, PA-C  OVER THE COUNTER MEDICATION Take 1 tablet by mouth daily. Schiff Probiotic Gummy    Historical Provider, MD  polyethylene glycol powder (GLYCOLAX/MIRALAX) powder Take 17 g by mouth daily. 10/11/13   Gregor Hams, MD   BP 180/113 mmHg  Pulse 85  Temp(Src) 98.7 F (37.1 C)  Resp 18  SpO2 95%  LMP 10/16/2014 Physical Exam  Constitutional: She appears well-developed and well-nourished. No distress.  HENT:  Head: Normocephalic and atraumatic.  Neck: Neck supple.  Cardiovascular: Normal rate and regular rhythm.   Pulmonary/Chest: Effort normal and breath sounds normal. No respiratory distress. She has no wheezes. She has no rales.  Abdominal: Soft. She exhibits no distension. There is tenderness in the right lower quadrant and suprapubic area.  There is no rebound and no guarding.  Genitourinary: There is bleeding in the vagina. No erythema or tenderness in the vagina. No foreign body around the vagina. No signs of injury around the vagina. No vaginal discharge found.  Dark blood in vaginal vault, moderate amount.  No clots.  No FB or abnormal tissue.  Bimanual exam limited secondary to body habitus.  Tenderness throughout suprapubic and Right adnexal areas.    Neurological: She is alert.  Skin: She is not diaphoretic.  Psychiatric: She has a normal mood and affect. Her behavior is normal.  Nursing note and vitals  reviewed.   ED Course  Procedures (including critical care time) Labs Review Labs Reviewed  WET PREP, GENITAL - Abnormal; Notable for the following:    WBC, Wet Prep HPF POC FEW (*)    All other components within normal limits  COMPREHENSIVE METABOLIC PANEL - Abnormal; Notable for the following:    Glucose, Bld 118 (*)    BUN <5 (*)    ALT 11 (*)    Total Bilirubin 0.2 (*)    All other components within normal limits  CBC WITH DIFFERENTIAL/PLATELET - Abnormal; Notable for the following:    Hemoglobin 9.2 (*)    HCT 32.6 (*)    MCV 67.2 (*)    MCH 19.0 (*)    MCHC 28.2 (*)    RDW 18.8 (*)    Platelets 529 (*)    All other components within normal limits  URINALYSIS, ROUTINE W REFLEX MICROSCOPIC - Abnormal; Notable for the following:    APPearance CLOUDY (*)    Glucose, UA 100 (*)    Hgb urine dipstick LARGE (*)    All other components within normal limits  URINE MICROSCOPIC-ADD ON - Abnormal; Notable for the following:    Squamous Epithelial / LPF FEW (*)    Bacteria, UA FEW (*)    All other components within normal limits  HIV ANTIBODY (ROUTINE TESTING)  I-STAT BETA HCG BLOOD, ED ([facility], [facility], [facility] ONLY)  GC/CHLAMYDIA PROBE AMP ([place])    Imaging Review US Transvaginal Non-ob  10/16/2014   CLINICAL DATA:  Right adnexal and suprapubic pain since this morning. Vaginal bleeding. Previous myomectomy.  EXAM: TRANSABDOMINAL AND TRANSVAGINAL ULTRASOUND OF PELVIS  DOPPLER ULTRASOUND OF OVARIES  TECHNIQUE: Both transabdominal and transvaginal ultrasound examinations of the pelvis were performed. Transabdominal technique was performed for global imaging of the pelvis including uterus, ovaries, adnexal regions, and pelvic cul-de-sac.  It was necessary to proceed with endovaginal exam following the transabdominal exam to visualize the endometrium and ovaries. Color and duplex Doppler ultrasound was utilized to evaluate blood flow to the ovaries.  COMPARISON:  CT 12/22/2013  FINDINGS:  Uterus  Measurements: 9.0 x 11.5 x 16.8 cm. Multiple fibroids are present. There is a fibroid over the posterior uterine body likely intramural measuring 4.7 cm. There is a fibroid over the uterine fundus likely intramural measuring 5.2 cm in greatest diameter.  Endometrium  Not easily defined due to multiple fibroids.  Right ovary  Not visualized. Note that the right ovary was noted to be high in the right pelvis towards the right lower quadrant containing a 4.2 cm dermoid cyst on previous CT.  Left ovary  Measurements: 2.4 x 3.8 x 4.7 cm. There is a complex cystic structure within the left ovary measuring 1.1 x 2.2 x 3.4 cm with thin septations suggesting a hemorrhagic cyst.  Pulsed Doppler evaluation of the left ovary demonstrates normal low-resistance arterial and venous  waveforms.  Other findings  There is a small anechoic tubular structure in the left adnexa which may represent hydrosalpinx as this measures 0.8 x 0.9 x 1.8 cm.  IMPRESSION: Enlarged fibroid uterus with the largest fibroid over the fundus measuring 5.2 cm in diameter likely intramural in location. Endometrium not well visualized.  Right ovary not visualized. Note that the previous CT demonstrated a right ovary high in position containing a 4.2 cm dermoid cyst. Given patient's suprapubic and right adnexal pain, consider CT for further evaluation.  3.4 cm complex left ovarian cyst suggesting a hemorrhagic cyst. Anechoic tubular structure in the left adnexa possibly hydrosalpinx. Recommend a follow-up pelvic ultrasound in 6 weeks.   Electronically Signed   By: Marin Olp M.D.   On: 10/16/2014 19:32   US Pelvis Complete  10/16/2014   CLINICAL DATA:  Right adnexal and suprapubic pain since this morning. Vaginal bleeding. Previous myomectomy.  EXAM: TRANSABDOMINAL AND TRANSVAGINAL ULTRASOUND OF PELVIS  DOPPLER ULTRASOUND OF OVARIES  TECHNIQUE: Both transabdominal and transvaginal ultrasound examinations of the pelvis were performed.  Transabdominal technique was performed for global imaging of the pelvis including uterus, ovaries, adnexal regions, and pelvic cul-de-sac.  It was necessary to proceed with endovaginal exam following the transabdominal exam to visualize the endometrium and ovaries. Color and duplex Doppler ultrasound was utilized to evaluate blood flow to the ovaries.  COMPARISON:  CT 12/22/2013  FINDINGS: Uterus  Measurements: 9.0 x 11.5 x 16.8 cm. Multiple fibroids are present. There is a fibroid over the posterior uterine body likely intramural measuring 4.7 cm. There is a fibroid over the uterine fundus likely intramural measuring 5.2 cm in greatest diameter.  Endometrium  Not easily defined due to multiple fibroids.  Right ovary  Not visualized. Note that the right ovary was noted to be high in the right pelvis towards the right lower quadrant containing a 4.2 cm dermoid cyst on previous CT.  Left ovary  Measurements: 2.4 x 3.8 x 4.7 cm. There is a complex cystic structure within the left ovary measuring 1.1 x 2.2 x 3.4 cm with thin septations suggesting a hemorrhagic cyst.  Pulsed Doppler evaluation of the left ovary demonstrates normal low-resistance arterial and venous waveforms.  Other findings  There is a small anechoic tubular structure in the left adnexa which may represent hydrosalpinx as this measures 0.8 x 0.9 x 1.8 cm.  IMPRESSION: Enlarged fibroid uterus with the largest fibroid over the fundus measuring 5.2 cm in diameter likely intramural in location. Endometrium not well visualized.  Right ovary not visualized. Note that the previous CT demonstrated a right ovary high in position containing a 4.2 cm dermoid cyst. Given patient's suprapubic and right adnexal pain, consider CT for further evaluation.  3.4 cm complex left ovarian cyst suggesting a hemorrhagic cyst. Anechoic tubular structure in the left adnexa possibly hydrosalpinx. Recommend a follow-up pelvic ultrasound in 6 weeks.   Electronically Signed   By:  Marin Olp M.D.   On: 10/16/2014 19:32   Ct Abdomen Pelvis W Contrast  10/16/2014   CLINICAL DATA:  Right lower quadrant and suprapubic abdominal pain.  EXAM: CT ABDOMEN AND PELVIS WITH CONTRAST  TECHNIQUE: Multidetector CT imaging of the abdomen and pelvis was performed using the standard protocol following bolus administration of intravenous contrast.  CONTRAST:  185mL OMNIPAQUE IOHEXOL 300 MG/ML  SOLN  COMPARISON:  Pelvic ultrasound earlier today. CT abdomen and pelvis 12/22/2013  FINDINGS: Dependent subsegmental atelectasis is present in the lung bases.  The liver, gallbladder,  spleen, adrenal glands, kidneys, and pancreas have an unremarkable enhanced appearance.  Oral contrast is present in the stomach and duodenum. Remainder of the small and large bowel are non-opacified. There is no evidence of bowel obstruction. Mild descending colon diverticulosis is noted without evidence of diverticulitis. Appendix is unremarkable.  Right ovarian mass containing macroscopic fat and calcification does not appear significantly changed in size, measuring 4.3 x 3.8 cm (previously 4.2 x 3.8 cm). Enlarged fibroid uterus is again seen and more fully evaluated on pelvic ultrasound earlier today. 3.4 cm left ovarian cyst small tubular structure in the left adnexa are also more fully characterized on recent ultrasound.  There is a small amount of free fluid in the pelvis. Bladder is unremarkable. No enlarged lymph nodes are identified. Mild atherosclerotic calcification is noted involving the iliac arteries. No acute osseous abnormality is seen.  IMPRESSION: 1. Small amount of pelvic free fluid which may be physiologic. 2. Unchanged right ovarian dermoid. 3. Fibroid uterus. 4. Diverticulosis.   Electronically Signed   By: Logan Bores   On: 10/16/2014 21:25   [place] Art/ven Flow Abd Pelv Doppler  10/16/2014   CLINICAL DATA:  Right adnexal and suprapubic pain since this morning. Vaginal bleeding. Previous myomectomy.  EXAM:  TRANSABDOMINAL AND TRANSVAGINAL ULTRASOUND OF PELVIS  DOPPLER ULTRASOUND OF OVARIES  TECHNIQUE: Both transabdominal and transvaginal ultrasound examinations of the pelvis were performed. Transabdominal technique was performed for global imaging of the pelvis including uterus, ovaries, adnexal regions, and pelvic cul-de-sac.  It was necessary to proceed with endovaginal exam following the transabdominal exam to visualize the endometrium and ovaries. Color and duplex Doppler ultrasound was utilized to evaluate blood flow to the ovaries.  COMPARISON:  CT 12/22/2013  FINDINGS: Uterus  Measurements: 9.0 x 11.5 x 16.8 cm. Multiple fibroids are present. There is a fibroid over the posterior uterine body likely intramural measuring 4.7 cm. There is a fibroid over the uterine fundus likely intramural measuring 5.2 cm in greatest diameter.  Endometrium  Not easily defined due to multiple fibroids.  Right ovary  Not visualized. Note that the right ovary was noted to be high in the right pelvis towards the right lower quadrant containing a 4.2 cm dermoid cyst on previous CT.  Left ovary  Measurements: 2.4 x 3.8 x 4.7 cm. There is a complex cystic structure within the left ovary measuring 1.1 x 2.2 x 3.4 cm with thin septations suggesting a hemorrhagic cyst.  Pulsed Doppler evaluation of the left ovary demonstrates normal low-resistance arterial and venous waveforms.  Other findings  There is a small anechoic tubular structure in the left adnexa which may represent hydrosalpinx as this measures 0.8 x 0.9 x 1.8 cm.  IMPRESSION: Enlarged fibroid uterus with the largest fibroid over the fundus measuring 5.2 cm in diameter likely intramural in location. Endometrium not well visualized.  Right ovary not visualized. Note that the previous CT demonstrated a right ovary high in position containing a 4.2 cm dermoid cyst. Given patient's suprapubic and right adnexal pain, consider CT for further evaluation.  3.4 cm complex left ovarian  cyst suggesting a hemorrhagic cyst. Anechoic tubular structure in the left adnexa possibly hydrosalpinx. Recommend a follow-up pelvic ultrasound in 6 weeks.   Electronically Signed   By: Marin Olp M.D.   On: 10/16/2014 19:32     EKG Interpretation None       9:56 PM Discussed pt and imaging results with Dr Alvino Chapel.   12:02 AM Pt has continued moderate tenderness in  suprapubic and RLQ areas without guarding or rebound.  Exam has somewhat improved.  Pt has been sleeping most of the ED visit.   Discussed with Dr Alvino Chapel.  I have considered torsion in this patient and though her right ovary not visualized on [place], appears normal on CT and pt has not been in distress since she arrived almost 9 hours ago.  Discussed all of this with patient and discussed follow up and return precautions.    MDM   Final diagnoses:  Pain  Pelvic pain in female  Vaginal bleeding    Afebrile, nontoxic patient with suprapubic and RLQ abdominal pain and vaginal bleeding that began last night and worsened today, now with N/V.  Labs remarkable for anemia.   UA negative for apparent infection.  Vaginal US unremarkable though unable to visualize right ovary.  CT abd/pelvis without significant acute findings.   D/C home with pain medication, gyn follow up.  Discussed return precautions.  Discussed result, findings, treatment, and follow up  with patient.  Pt given return precautions.  Pt verbalizes understanding and agrees with plan.         Clayton Bibles, PA-C 10/17/14 0005  Davonna Belling, MD 10/17/14 [phone]

## 2014-10-16 NOTE — ED Notes (Signed)
Patient transported to CT 

## 2014-10-16 NOTE — ED Notes (Signed)
Pt here for lower abd pain. Denies N,V,D. sts she is on her menstrual cycle. denies urinary problems.

## 2014-10-16 NOTE — ED Notes (Signed)
Informed pt of need for urine specimen for urinalysis.

## 2014-10-16 NOTE — ED Notes (Signed)
REPORT TO Jerene Pitch, RN.  PT CARE TRANSFERRED

## 2014-10-16 NOTE — ED Notes (Signed)
Pt returned from CT °

## 2014-10-17 ENCOUNTER — Inpatient Hospital Stay (HOSPITAL_COMMUNITY)
Admission: AD | Admit: 2014-10-17 | Discharge: 2014-10-17 | Disposition: A | Discharge status: Home / Self Care | Payer: 59 | Source: Ambulatory Visit | Attending: Obstetrics and Gynecology | Admitting: Obstetrics and Gynecology

## 2014-10-17 ENCOUNTER — Encounter (HOSPITAL_COMMUNITY): Payer: Self-pay | Admitting: *Deleted

## 2014-10-17 DIAGNOSIS — N939 Abnormal uterine and vaginal bleeding, unspecified: Secondary | ICD-10-CM | POA: Diagnosis not present

## 2014-10-17 DIAGNOSIS — R102 Pelvic and perineal pain: Secondary | ICD-10-CM | POA: Diagnosis present

## 2014-10-17 DIAGNOSIS — D259 Leiomyoma of uterus, unspecified: Secondary | ICD-10-CM | POA: Diagnosis not present

## 2014-10-17 DIAGNOSIS — D500 Iron deficiency anemia secondary to blood loss (chronic): Secondary | ICD-10-CM | POA: Diagnosis not present

## 2014-10-17 LAB — HIV ANTIBODY (ROUTINE TESTING W REFLEX): HIV SCREEN 4TH GENERATION: NONREACTIVE

## 2014-10-17 LAB — CBC
HEMATOCRIT: 26 % — AB (ref 36.0–46.0)
Hemoglobin: 7.3 g/dL — ABNORMAL LOW (ref 12.0–15.0)
MCH: 19.4 pg — AB (ref 26.0–34.0)
MCHC: 28.1 g/dL — ABNORMAL LOW (ref 30.0–36.0)
MCV: 69 fL — ABNORMAL LOW (ref 78.0–100.0)
Platelets: 412 10*3/uL — ABNORMAL HIGH (ref 150–400)
RBC: 3.77 MIL/uL — ABNORMAL LOW (ref 3.87–5.11)
RDW: 19.2 % — ABNORMAL HIGH (ref 11.5–15.5)
WBC: 9 10*3/uL (ref 4.0–10.5)

## 2014-10-17 LAB — GC/CHLAMYDIA PROBE AMP (~~LOC~~) NOT AT ARMC
Chlamydia: NEGATIVE
Neisseria Gonorrhea: NEGATIVE

## 2014-10-17 MED ORDER — FERROUS SULFATE 325 (65 FE) MG PO TABS: 325.0000 mg | ORAL_TABLET | Freq: Two times a day (BID) | ORAL | Status: DC

## 2014-10-17 MED ORDER — NORGESTIMATE-ETH ESTRADIOL 0.25-35 MG-MCG PO TABS: 2.0000 | ORAL_TABLET | Freq: Every day | ORAL | Status: DC

## 2014-10-17 MED ORDER — HYDROCODONE-ACETAMINOPHEN 5-325 MG PO TABS
1.0000 | ORAL_TABLET | Freq: Once | ORAL | Status: AC
  Administered 2014-10-17: 1 via ORAL
  Filled 2014-10-17: qty 1

## 2014-10-17 MED ORDER — HYDROCODONE-ACETAMINOPHEN 5-325 MG PO TABS: 1.0000 | ORAL_TABLET | ORAL | Status: DC | PRN

## 2014-10-17 MED ORDER — IBUPROFEN 600 MG PO TABS: 600.0000 mg | ORAL_TABLET | Freq: Four times a day (QID) | ORAL | Status: DC | PRN

## 2014-10-17 MED ORDER — KETOROLAC TROMETHAMINE 60 MG/2ML IM SOLN
60.0000 mg | Freq: Once | INTRAMUSCULAR | Status: AC
  Administered 2014-10-17: 60 mg via INTRAMUSCULAR
  Filled 2014-10-17: qty 2

## 2014-10-17 NOTE — MAU Provider Note (Signed)
History     CSN: [id number]  Arrival date and time: 10/17/14 8338   First Provider Initiated Contact with Patient 10/17/14 0710      Chief Complaint  Patient presents with  . Abdominal Pain   HPI  Amy Banks is here with report of continued lower pelvic pain.  Upon review of the records patient seen at [facility] yesterday for similar pain.  CT/ultrasound and blood work collected.  Pt diagnosed with uterine fibroids and ovarian cyst.  Pt given RX for pain medication however have not picked them up due to be discharged late.  Also here due to concern for increased bleeding and clots.  Reports had minimal bleeding earlier.  Appt scheduled in two weeks with Dr. Daneil Dolin.    Past Medical History  Diagnosis Date  . Diabetes mellitus without complication   . Hypertension   . Diverticulitis 08/2013    Past Surgical History  Procedure Laterality Date  . Breast surgery      reconstruction  . Myomectomy      Family History  Problem Relation Age of Onset  . Hypertension    . Diabetes    . Breast cancer    . Ovarian cancer      History  Substance Use Topics  . Smoking status: Never Smoker   . Smokeless tobacco: Never Used  . Alcohol Use: No    Allergies:  Allergies  Allergen Reactions  . Codeine Swelling    Prescriptions prior to admission  Medication Sig Dispense Refill Last Dose  . amoxicillin-clavulanate (AUGMENTIN) 875-125 MG per tablet Take 1 tablet by mouth 2 (two) times daily. 28 tablet 0 Taking  . HYDROcodone-acetaminophen (NORCO/VICODIN) 5-325 MG per tablet Take 1-2 tablets by mouth every 4 (four) hours as needed for moderate pain or severe pain. 15 tablet 0   . Letrozole (FEMARA PO) Take by mouth.   Taking  . losartan-hydrochlorothiazide (HYZAAR) 100-25 MG per tablet Take 1 tablet by mouth daily.   Taking  . metFORMIN (GLUCOPHAGE) 1000 MG tablet Take 1,000 mg by mouth 2 (two) times daily with a meal.   Taking  . ondansetron (ZOFRAN) 4 MG tablet Take 1  tablet (4 mg total) by mouth every 6 (six) hours. 12 tablet 0 Taking  . OVER THE COUNTER MEDICATION Take 1 tablet by mouth daily. Schiff Probiotic Gummy   Taking  . polyethylene glycol powder (GLYCOLAX/MIRALAX) powder Take 17 g by mouth daily. 850 g 1 Taking    Review of Systems  Gastrointestinal: Positive for nausea, vomiting and abdominal pain.  Neurological: Positive for headaches.  All other systems reviewed and are negative.  Physical Exam   Blood pressure 164/102, pulse 65, temperature 98.4 F (36.9 C), temperature source Oral, resp. rate 20, height 5\' 6"  (1.676 m), weight 150.594 kg (332 lb), last menstrual period 10/16/2014, SpO2 100 %.  Physical Exam  Constitutional: She is oriented to person, place, and time. She appears well-developed and well-nourished. No distress.  HENT:  Head: Normocephalic.  Neck: Normal range of motion. Neck supple.  Cardiovascular: Normal rate, regular rhythm and normal heart sounds.  Exam reveals no gallop and no friction rub.   No murmur heard. Respiratory: Effort normal and breath sounds normal. No respiratory distress.  GI: There is no CVA tenderness.  Genitourinary: Cervix exhibits no motion tenderness and no discharge. There is bleeding (moderate) in the vagina.  Musculoskeletal: Normal range of motion.  Neurological: She is alert and oriented to person, place, and time.  Skin: Skin is warm and dry.  Psychiatric: She has a normal mood and affect.    MAU Course  Procedures Results for orders placed or performed during the hospital encounter of 10/16/14 (from the past 24 hour(s))  Comprehensive metabolic panel     Status: Abnormal   Collection Time: 10/16/14  3:45 PM  Result Value Ref Range   Sodium 136 135 - 145 mmol/L   Potassium 3.7 3.5 - 5.1 mmol/L   Chloride 101 101 - 111 mmol/L   CO2 26 22 - 32 mmol/L   Glucose, Bld 118 (H) 65 - 99 mg/dL   BUN <5 (L) 6 - 20 mg/dL   Creatinine, Ser 0.74 0.44 - 1.00 mg/dL   Calcium 9.3 8.9 - 10.3  mg/dL   Total Protein 7.8 6.5 - 8.1 g/dL   Albumin 3.9 3.5 - 5.0 g/dL   AST 17 15 - 41 U/L   ALT 11 (L) 14 - 54 U/L   Alkaline Phosphatase 76 38 - 126 U/L   Total Bilirubin 0.2 (L) 0.3 - 1.2 mg/dL   GFR calc non Af Amer >60 >60 mL/min   GFR calc Af Amer >60 >60 mL/min   Anion gap 9 5 - 15  CBC with Differential     Status: Abnormal   Collection Time: 10/16/14  3:45 PM  Result Value Ref Range   WBC 8.5 4.0 - 10.5 K/uL   RBC 4.85 3.87 - 5.11 MIL/uL   Hemoglobin 9.2 (L) 12.0 - 15.0 g/dL   HCT 32.6 (L) 36.0 - 46.0 %   MCV 67.2 (L) 78.0 - 100.0 fL   MCH 19.0 (L) 26.0 - 34.0 pg   MCHC 28.2 (L) 30.0 - 36.0 g/dL   RDW 18.8 (H) 11.5 - 15.5 %   Platelets 529 (H) 150 - 400 K/uL   Neutrophils Relative % 56 43 - 77 %   Neutro Abs 4.7 1.7 - 7.7 K/uL   Lymphocytes Relative 38 12 - 46 %   Lymphs Abs 3.2 0.7 - 4.0 K/uL   Monocytes Relative 5 3 - 12 %   Monocytes Absolute 0.4 0.1 - 1.0 K/uL   Eosinophils Relative 1 0 - 5 %   Eosinophils Absolute 0.1 0.0 - 0.7 K/uL   Basophils Relative 0 0 - 1 %   Basophils Absolute 0.0 0.0 - 0.1 K/uL  I-Stat Beta hCG blood, ED ([facility], [facility], [facility] only)     Status: None   Collection Time: 10/16/14  4:15 PM  Result Value Ref Range   I-stat hCG, quantitative <5.0 <5 mIU/mL   Comment 3          Wet prep, genital     Status: Abnormal   Collection Time: 10/16/14  7:56 PM  Result Value Ref Range   Yeast Wet Prep HPF POC NONE SEEN NONE SEEN   Trich, Wet Prep NONE SEEN NONE SEEN   Clue Cells Wet Prep HPF POC NONE SEEN NONE SEEN   WBC, Wet Prep HPF POC FEW (A) NONE SEEN  Urinalysis, Routine w reflex microscopic     Status: Abnormal   Collection Time: 10/16/14 11:25 PM  Result Value Ref Range   Color, Urine YELLOW YELLOW   APPearance CLOUDY (A) CLEAR   Specific Gravity, Urine 1.020 1.005 - 1.030   pH 5.0 5.0 - 8.0   Glucose, UA 100 (A) NEGATIVE mg/dL   Hgb urine dipstick LARGE (A) NEGATIVE   Bilirubin Urine NEGATIVE NEGATIVE   Ketones, ur NEGATIVE NEGATIVE mg/dL  Protein, ur NEGATIVE NEGATIVE mg/dL   Urobilinogen, UA 0.2 0.0 - 1.0 mg/dL   Nitrite NEGATIVE NEGATIVE   Leukocytes, UA NEGATIVE NEGATIVE  Urine microscopic-add on     Status: Abnormal   Collection Time: 10/16/14 11:25 PM  Result Value Ref Range   Squamous Epithelial / LPF FEW (A) RARE   WBC, UA 0-2 <3 WBC/hpf   RBC / HPF TOO NUMEROUS TO COUNT <3 RBC/hpf   Bacteria, UA FEW (A) RARE   Ultrasound: IMPRESSION: Enlarged fibroid uterus with the largest fibroid over the fundus measuring 5.2 cm in diameter likely intramural in location. Endometrium not well visualized.  Right ovary not visualized. Note that the previous CT demonstrated a right ovary high in position containing a 4.2 cm dermoid cyst. Given patient's suprapubic and right adnexal pain, consider CT for further evaluation.  3.4 cm complex left ovarian cyst suggesting a hemorrhagic cyst. Anechoic tubular structure in the left adnexa possibly hydrosalpinx. Recommend a follow-up pelvic ultrasound in 6 weeks.    CT Scan: FINDINGS: Dependent subsegmental atelectasis is present in the lung bases.  The liver, gallbladder, spleen, adrenal glands, kidneys, and pancreas have an unremarkable enhanced appearance.  Oral contrast is present in the stomach and duodenum. Remainder of the small and large bowel are non-opacified. There is no evidence of bowel obstruction. Mild descending colon diverticulosis is noted without evidence of diverticulitis. Appendix is unremarkable.  Right ovarian mass containing macroscopic fat and calcification does not appear significantly changed in size, measuring 4.3 x 3.8 cm (previously 4.2 x 3.8 cm). Enlarged fibroid uterus is again seen and more fully evaluated on pelvic ultrasound earlier today. 3.4 cm left ovarian cyst small tubular structure in the left adnexa are also more fully characterized on recent ultrasound.  There is a small amount of free fluid in the pelvis. Bladder  is unremarkable. No enlarged lymph nodes are identified. Mild atherosclerotic calcification is noted involving the iliac arteries. No acute osseous abnormality is seen.  IMPRESSION: 1. Small amount of pelvic free fluid which may be physiologic. 2. Unchanged right ovarian dermoid. 3. Fibroid uterus. 4. Diverticulosis.  0800 Report given to Noni Saupe who assumes care of patient.    Venia Carbon Michiel Cowboy, CNM   Discussed patient with Dr. Matthew Saras at [phone].  Toradol 60 mg IM given; signigicant pain relief at the time of discharge   Patient up to the bathroom without difficulty.   Assessment and Plan   A:  1. Episode of heavy vaginal bleeding   2. Uterine leiomyoma, unspecified location   3. Iron deficiency anemia due to chronic blood loss    P:  Discharge home in stable condition  RX: Ibuprofen 600 mg tabs        Iron        Sprintec (2 tabs for 1 week, then 1 tab daily for 1 week) Follow up with Dr. Carren Rang this week in the office Bleeding precautions Return to MAU if symptoms worsen Patient has RX for Vicodin    Lezlie Lye, NP 10/17/2014 11:05 AM

## 2014-10-17 NOTE — Discharge Instructions (Signed)
Read the information below.  Use the prescribed medication as directed.  Please discuss all new medications with your pharmacist.  Do not take additional tylenol while taking the prescribed pain medication to avoid overdose.  You may return to the Emergency Department at any time for worsening condition or any new symptoms that concern you.    If you develop high fevers, worsening abdominal or pelvic pain, uncontrolled vomiting, or are unable to tolerate fluids by mouth, return to the ER for a recheck.     Abdominal Pain, Women Abdominal (stomach, pelvic, or belly) pain can be caused by many things. It is important to tell your doctor:  The location of the pain.  Does it come and go or is it present all the time?  Are there things that start the pain (eating certain foods, exercise)?  Are there other symptoms associated with the pain (fever, nausea, vomiting, diarrhea)? All of this is helpful to know when trying to find the cause of the pain. CAUSES   Stomach: virus or bacteria infection, or ulcer.  Intestine: appendicitis (inflamed appendix), regional ileitis (Crohn's disease), ulcerative colitis (inflamed colon), irritable bowel syndrome, diverticulitis (inflamed diverticulum of the colon), or cancer of the stomach or intestine.  Gallbladder disease or stones in the gallbladder.  Kidney disease, kidney stones, or infection.  Pancreas infection or cancer.  Fibromyalgia (pain disorder).  Diseases of the female organs:  Uterus: fibroid (non-cancerous) tumors or infection.  Fallopian tubes: infection or tubal pregnancy.  Ovary: cysts or tumors.  Pelvic adhesions (scar tissue).  Endometriosis (uterus lining tissue growing in the pelvis and on the pelvic organs).  Pelvic congestion syndrome (female organs filling up with blood just before the menstrual period).  Pain with the menstrual period.  Pain with ovulation (producing an egg).  Pain with an IUD (intrauterine device,  birth control) in the uterus.  Cancer of the female organs.  Functional pain (pain not caused by a disease, may improve without treatment).  Psychological pain.  Depression. DIAGNOSIS  Your doctor will decide the seriousness of your pain by doing an examination.  Blood tests.  X-rays.  Ultrasound.  CT scan (computed tomography, special type of X-ray).  MRI (magnetic resonance imaging).  Cultures, for infection.  Barium enema (dye inserted in the large intestine, to better view it with X-rays).  Colonoscopy (looking in intestine with a lighted tube).  Laparoscopy (minor surgery, looking in abdomen with a lighted tube).  Major abdominal exploratory surgery (looking in abdomen with a large incision). TREATMENT  The treatment will depend on the cause of the pain.   Many cases can be observed and treated at home.  Over-the-counter medicines recommended by your caregiver.  Prescription medicine.  Antibiotics, for infection.  Birth control pills, for painful periods or for ovulation pain.  Hormone treatment, for endometriosis.  Nerve blocking injections.  Physical therapy.  Antidepressants.  Counseling with a psychologist or psychiatrist.  Minor or major surgery. HOME CARE INSTRUCTIONS   Do not take laxatives, unless directed by your caregiver.  Take over-the-counter pain medicine only if ordered by your caregiver. Do not take aspirin because it can cause an upset stomach or bleeding.  Try a clear liquid diet (broth or water) as ordered by your caregiver. Slowly move to a bland diet, as tolerated, if the pain is related to the stomach or intestine.  Have a thermometer and take your temperature several times a day, and record it.  Bed rest and sleep, if it helps the pain.  Avoid sexual intercourse, if it causes pain.  Avoid stressful situations.  Keep your follow-up appointments and tests, as your caregiver orders.  If the pain does not go away with  medicine or surgery, you may try:  Acupuncture.  Relaxation exercises (yoga, meditation).  Group therapy.  Counseling. SEEK MEDICAL CARE IF:   You notice certain foods cause stomach pain.  Your home care treatment is not helping your pain.  You need stronger pain medicine.  You want your IUD removed.  You feel faint or lightheaded.  You develop nausea and vomiting.  You develop a rash.  You are having side effects or an allergy to your medicine. SEEK IMMEDIATE MEDICAL CARE IF:   Your pain does not go away or gets worse.  You have a fever.  Your pain is felt only in portions of the abdomen. The right side could possibly be appendicitis. The left lower portion of the abdomen could be colitis or diverticulitis.  You are passing blood in your stools (bright red or black tarry stools, with or without vomiting).  You have blood in your urine.  You develop chills, with or without a fever.  You pass out. MAKE SURE YOU:   Understand these instructions.  Will watch your condition.  Will get help right away if you are not doing well or get worse. Document Released: 03/17/2007 Document Revised: 10/04/2013 Document Reviewed: 04/06/2009 [facility] Patient Information 2015 [place], [place]. This information is not intended to replace advice given to you by your health care provider. Make sure you discuss any questions you have with your health care provider.  Abnormal Uterine Bleeding Abnormal uterine bleeding can affect women at various stages in life, including teenagers, women in their reproductive years, pregnant women, and women who have reached menopause. Several kinds of uterine bleeding are considered abnormal, including:  Bleeding or spotting between periods.   Bleeding after sexual intercourse.   Bleeding that is heavier or more than normal.   Periods that last longer than usual.  Bleeding after menopause.  Many cases of abnormal uterine bleeding are minor  and simple to treat, while others are more serious. Any type of abnormal bleeding should be evaluated by your health care provider. Treatment will depend on the cause of the bleeding. HOME CARE INSTRUCTIONS Monitor your condition for any changes. The following actions may help to alleviate any discomfort you are experiencing:  Avoid the use of tampons and douches as directed by your health care provider.  Change your pads frequently. You should get regular pelvic exams and Pap tests. Keep all follow-up appointments for diagnostic tests as directed by your health care provider.  SEEK MEDICAL CARE IF:   Your bleeding lasts more than 1 week.   You feel dizzy at times.  SEEK IMMEDIATE MEDICAL CARE IF:   You pass out.   You are changing pads every 15 to 30 minutes.   You have abdominal pain.  You have a fever.   You become sweaty or weak.   You are passing large blood clots from the vagina.   You start to feel nauseous and vomit. MAKE SURE YOU:   Understand these instructions.  Will watch your condition.  Will get help right away if you are not doing well or get worse. Document Released: 05/20/2005 Document Revised: 05/25/2013 Document Reviewed: 12/17/2012 [facility] Patient Information 2015 [place], [place]. This information is not intended to replace advice given to you by your health care provider. Make sure you discuss any questions you  have with your health care provider. ° °

## 2014-10-17 NOTE — Discharge Instructions (Signed)
Abnormal Uterine Bleeding Abnormal uterine bleeding can affect women at various stages in life, including teenagers, women in their reproductive years, pregnant women, and women who have reached menopause. Several kinds of uterine bleeding are considered abnormal, including:  Bleeding or spotting between periods.   Bleeding after sexual intercourse.   Bleeding that is heavier or more than normal.   Periods that last longer than usual.  Bleeding after menopause.  Many cases of abnormal uterine bleeding are minor and simple to treat, while others are more serious. Any type of abnormal bleeding should be evaluated by your health care provider. Treatment will depend on the cause of the bleeding. HOME CARE INSTRUCTIONS Monitor your condition for any changes. The following actions may help to alleviate any discomfort you are experiencing:  Avoid the use of tampons and douches as directed by your health care provider.  Change your pads frequently. You should get regular pelvic exams and Pap tests. Keep all follow-up appointments for diagnostic tests as directed by your health care provider.  SEEK MEDICAL CARE IF:   Your bleeding lasts more than 1 week.   You feel dizzy at times.  SEEK IMMEDIATE MEDICAL CARE IF:   You pass out.   You are changing pads every 15 to 30 minutes.   You have abdominal pain.  You have a fever.   You become sweaty or weak.   You are passing large blood clots from the vagina.   You start to feel nauseous and vomit. MAKE SURE YOU:   Understand these instructions.  Will watch your condition.  Will get help right away if you are not doing well or get worse. Document Released: 05/20/2005 Document Revised: 05/25/2013 Document Reviewed: 12/17/2012 [facility] Patient Information 2015 [place], [place]. This information is not intended to replace advice given to you by your health care provider. Make sure you discuss any questions you have with your  health care provider.  Anemia, Nonspecific Anemia is a condition in which the concentration of red blood cells or hemoglobin in the blood is below normal. Hemoglobin is a substance in red blood cells that carries oxygen to the tissues of the body. Anemia results in not enough oxygen reaching these tissues.  CAUSES  Common causes of anemia include:   Excessive bleeding. Bleeding may be internal or external. This includes excessive bleeding from periods (in women) or from the intestine.   Poor nutrition.   Chronic kidney, thyroid, and liver disease.  Bone marrow disorders that decrease red blood cell production.  Cancer and treatments for cancer.  HIV, AIDS, and their treatments.  Spleen problems that increase red blood cell destruction.  Blood disorders.  Excess destruction of red blood cells due to infection, medicines, and autoimmune disorders. SIGNS AND SYMPTOMS   Minor weakness.   Dizziness.   Headache.  Palpitations.   Shortness of breath, especially with exercise.   Paleness.  Cold sensitivity.  Indigestion.  Nausea.  Difficulty sleeping.  Difficulty concentrating. Symptoms may occur suddenly or they may develop slowly.  DIAGNOSIS  Additional blood tests are often needed. These help your health care provider determine the best treatment. Your health care provider will check your stool for blood and look for other causes of blood loss.  TREATMENT  Treatment varies depending on the cause of the anemia. Treatment can include:   Supplements of iron, vitamin W09, or folic acid.   Hormone medicines.   A blood transfusion. This may be needed if blood loss is severe.   Hospitalization.  This may be needed if there is significant continual blood loss.   Dietary changes.  Spleen removal. HOME CARE INSTRUCTIONS Keep all follow-up appointments. It often takes many weeks to correct anemia, and having your health care provider check on your condition  and your response to treatment is very important. SEEK IMMEDIATE MEDICAL CARE IF:   You develop extreme weakness, shortness of breath, or chest pain.   You become dizzy or have trouble concentrating.  You develop heavy vaginal bleeding.   You develop a rash.   You have bloody or black, tarry stools.   You faint.   You vomit up blood.   You vomit repeatedly.   You have abdominal pain.  You have a fever or persistent symptoms for more than 2-3 days.   You have a fever and your symptoms suddenly get worse.   You are dehydrated.  MAKE SURE YOU:  Understand these instructions.  Will watch your condition.  Will get help right away if you are not doing well or get worse. Document Released: 06/27/2004 Document Revised: 01/20/2013 Document Reviewed: 11/13/2012 [facility] Patient Information 2015 [place], [place]. This information is not intended to replace advice given to you by your health care provider. Make sure you discuss any questions you have with your health care provider.

## 2014-10-17 NOTE — MAU Note (Signed)
Pt states she was seen at [facility] ED yesterday due to lower rt side pain that was severe. States they did a lot of test and told her if the pain worsened to come here, states they told her to come here due to she has fibroids.

## 2014-11-01 ENCOUNTER — Other Ambulatory Visit: Payer: Self-pay | Admitting: Gynecology

## 2014-11-01 DIAGNOSIS — E229 Hyperfunction of pituitary gland, unspecified: Principal | ICD-10-CM

## 2014-11-01 DIAGNOSIS — R7989 Other specified abnormal findings of blood chemistry: Secondary | ICD-10-CM

## 2014-11-15 ENCOUNTER — Ambulatory Visit
Admission: RE | Admit: 2014-11-15 | Discharge: 2014-11-15 | Disposition: A | Discharge status: Home / Self Care | Payer: 59 | Source: Ambulatory Visit | Attending: Gynecology | Admitting: Gynecology

## 2014-11-15 DIAGNOSIS — E229 Hyperfunction of pituitary gland, unspecified: Principal | ICD-10-CM

## 2014-11-15 DIAGNOSIS — R7989 Other specified abnormal findings of blood chemistry: Secondary | ICD-10-CM

## 2014-11-15 MED ORDER — GADOBENATE DIMEGLUMINE 529 MG/ML IV SOLN
10.0000 mL | Freq: Once | INTRAVENOUS | Status: AC | PRN
  Administered 2014-11-15: 10 mL via INTRAVENOUS

## 2014-11-30 ENCOUNTER — Other Ambulatory Visit: Payer: Self-pay | Admitting: Gynecology

## 2014-11-30 DIAGNOSIS — D352 Benign neoplasm of pituitary gland: Secondary | ICD-10-CM

## 2014-12-14 ENCOUNTER — Other Ambulatory Visit (HOSPITAL_COMMUNITY): Payer: Self-pay | Admitting: *Deleted

## 2014-12-15 ENCOUNTER — Encounter (HOSPITAL_COMMUNITY)
Admission: RE | Admit: 2014-12-15 | Discharge: 2014-12-15 | Disposition: A | Discharge status: Home / Self Care | Payer: 59 | Source: Ambulatory Visit | Attending: Obstetrics and Gynecology | Admitting: Obstetrics and Gynecology

## 2014-12-15 DIAGNOSIS — D649 Anemia, unspecified: Secondary | ICD-10-CM | POA: Diagnosis not present

## 2014-12-15 MED ORDER — SODIUM CHLORIDE 0.9 % IV SOLN
510.0000 mg | INTRAVENOUS | Status: DC
  Administered 2014-12-15: 510 mg via INTRAVENOUS
  Filled 2014-12-15: qty 17

## 2014-12-19 ENCOUNTER — Ambulatory Visit (HOSPITAL_COMMUNITY)
Admission: RE | Admit: 2014-12-19 | Discharge: 2014-12-19 | Disposition: A | Discharge status: Home / Self Care | Payer: 59 | Source: Ambulatory Visit | Attending: Obstetrics and Gynecology | Admitting: Obstetrics and Gynecology

## 2014-12-19 DIAGNOSIS — D649 Anemia, unspecified: Secondary | ICD-10-CM | POA: Insufficient documentation

## 2014-12-19 MED ORDER — SODIUM CHLORIDE 0.9 % IV SOLN
510.0000 mg | Freq: Once | INTRAVENOUS | Status: AC
  Administered 2014-12-19: 510 mg via INTRAVENOUS
  Filled 2014-12-19: qty 17

## 2014-12-19 NOTE — H&P (Addendum)
Amy Banks is an 39 y.o. female with symptomatic fibrids and infertility presents for surgical mngt.  Pt with severe menorrhagia - last Hgb 8.1     Menstrual History:  No LMP recorded.    Past Medical History  Diagnosis Date  . Diabetes mellitus without complication   . Hypertension   . Diverticulitis 08/2013  hyperprolactinemia - small microadenoma  Past Surgical History  Procedure Laterality Date  . Breast surgery      reconstruction  . Myomectomy      Family History  Problem Relation Age of Onset  . Hypertension    . Diabetes    . Breast cancer    . Ovarian cancer      Social History:  reports that she has never smoked. She has never used smokeless tobacco. She reports that she does not drink alcohol or use illicit drugs.  Allergies:  Allergies  Allergen Reactions  . Codeine Swelling, Hives and Shortness Of Breath    No prescriptions prior to admission    ROS  AF, VSS  Physical Exam  Gen - NAD ABd - soft, obese, NT CV - RRR Lungs - clear Ext - NT PV - enlarged uterus, no adnexal tenderness  PV [place]:  9.4x8.5cm fibroid, left hydrosalpinx.  No right adnexal masses or free fluid  Assessment/Plan: Menorrhagia, fibroid, infertility Plan for myomectomy w/ left salpingectomy R/b/a discussed, questions answered, informed consent Pt understands risk of blood transfusion, hysterectomy, injury to other surrounding abdominal/pelvic organs  Alli Jasmer 12/19/2014, 11:58 AM

## 2014-12-26 ENCOUNTER — Encounter (HOSPITAL_COMMUNITY)
Admission: RE | Admit: 2014-12-26 | Discharge: 2014-12-26 | Disposition: A | Discharge status: Home / Self Care | Payer: 59 | Source: Ambulatory Visit | Attending: Obstetrics and Gynecology | Admitting: Obstetrics and Gynecology

## 2014-12-26 ENCOUNTER — Encounter (HOSPITAL_COMMUNITY): Payer: Self-pay

## 2014-12-26 DIAGNOSIS — Z01812 Encounter for preprocedural laboratory examination: Secondary | ICD-10-CM | POA: Insufficient documentation

## 2014-12-26 DIAGNOSIS — D219 Benign neoplasm of connective and other soft tissue, unspecified: Secondary | ICD-10-CM | POA: Diagnosis not present

## 2014-12-26 HISTORY — DX: Anemia, unspecified: D64.9

## 2014-12-26 LAB — CBC
HCT: 33.3 % — ABNORMAL LOW (ref 36.0–46.0)
HEMOGLOBIN: 9.6 g/dL — AB (ref 12.0–15.0)
MCH: 21.3 pg — AB (ref 26.0–34.0)
MCHC: 28.8 g/dL — ABNORMAL LOW (ref 30.0–36.0)
MCV: 74 fL — ABNORMAL LOW (ref 78.0–100.0)
PLATELETS: 445 10*3/uL — AB (ref 150–400)
RBC: 4.5 MIL/uL (ref 3.87–5.11)
RDW: 26.3 % — ABNORMAL HIGH (ref 11.5–15.5)
WBC: 7.7 10*3/uL (ref 4.0–10.5)

## 2014-12-26 LAB — COMPREHENSIVE METABOLIC PANEL
ALK PHOS: 76 U/L (ref 38–126)
ALT: 13 U/L — ABNORMAL LOW (ref 14–54)
ANION GAP: 5 (ref 5–15)
AST: 16 U/L (ref 15–41)
Albumin: 4.1 g/dL (ref 3.5–5.0)
BUN: 6 mg/dL (ref 6–20)
CALCIUM: 8.9 mg/dL (ref 8.9–10.3)
CO2: 28 mmol/L (ref 22–32)
CREATININE: 0.82 mg/dL (ref 0.44–1.00)
Chloride: 105 mmol/L (ref 101–111)
GFR calc Af Amer: >60 mL/min (ref >60)
GFR calc non Af Amer: >60 mL/min (ref >60)
GLUCOSE: 132 mg/dL — AB (ref 65–99)
Potassium: 3.5 mmol/L (ref 3.5–5.1)
Sodium: 138 mmol/L (ref 135–145)
Total Bilirubin: 0.4 mg/dL (ref 0.3–1.2)
Total Protein: 7.9 g/dL (ref 6.5–8.1)

## 2014-12-26 LAB — ABO/RH: ABO/RH(D): A POS

## 2014-12-26 NOTE — Pre-Procedure Instructions (Signed)
Patient had a high blood pressure today at [facility] visit. Dr. Tresa Moore called and he came and spoke with pt. She will go today to [facility] in [place]  to follow up on her blood pressure and seek medication. Dr. Tresa Moore did not request an EKG at this time.

## 2014-12-26 NOTE — Patient Instructions (Signed)
Your procedure is scheduled on:  January 02, 2015  Enter through the Main Entrance of [facility] at:  6:00 am   Pick up the phone at the desk and dial [phone].  Call this number if you have problems the morning of surgery: [phone].  Remember: Do NOT eat food: after midnight on Sunday  Do NOT drink clear liquids after:  Midnight on Sunday    Take these medicines the morning of surgery with a SIP OF WATER:  None  Hold Metformin for 24 hours prior to surgery   Do NOT wear jewelry (body piercing), metal hair clips/bobby pins, make-up, or nail polish. Do NOT wear lotions, powders, or perfumes.  You may wear deoderant. Do NOT shave for 48 hours prior to surgery. Do NOT bring valuables to the hospital. Contacts, dentures, or bridgework may not be worn into surgery. Leave suitcase in car.  After surgery it may be brought to your room.  For patients admitted to the hospital, checkout time is 11:00 AM the day of discharge.

## 2015-01-01 MED ORDER — DEXTROSE 5 % IV SOLN
2.0000 g | INTRAVENOUS | Status: AC
  Administered 2015-01-02: 2 g via INTRAVENOUS
  Filled 2015-01-01: qty 2

## 2015-01-01 NOTE — Anesthesia Preprocedure Evaluation (Addendum)
Anesthesia Evaluation  Patient identified by MRN, date of birth, ID band Patient awake    Reviewed: Allergy & Precautions, NPO status , Patient's Chart, lab work & pertinent test results  History of Anesthesia Complications Negative for: history of anesthetic complications  Airway Mallampati: III  TM Distance: >3 FB Neck ROM: Full    Dental no notable dental hx. (+) Dental Advisory Given   Pulmonary neg pulmonary ROS,  breath sounds clear to auscultation  Pulmonary exam normal       Cardiovascular hypertension, Pt. on medications Normal cardiovascular examRhythm:Regular Rate:Normal     Neuro/Psych negative neurological ROS  negative psych ROS   GI/Hepatic negative GI ROS, Neg liver ROS,   Endo/Other  diabetes, Type 2Morbid obesity  Renal/GU negative Renal ROS  negative genitourinary   Musculoskeletal negative musculoskeletal ROS (+)   Abdominal (+) + obese,   Peds negative pediatric ROS (+)  Hematology  (+) anemia ,   Anesthesia Other Findings   Reproductive/Obstetrics negative OB ROS                            Anesthesia Physical Anesthesia Plan  ASA: III  Anesthesia Plan: General   Post-op Pain Management:    Induction: Intravenous  Airway Management Planned: Oral ETT  Additional Equipment:   Intra-op Plan:   Post-operative Plan: Extubation in OR  Informed Consent: I have reviewed the patients History and Physical, chart, labs and discussed the procedure including the risks, benefits and alternatives for the proposed anesthesia with the patient or authorized representative who has indicated his/her understanding and acceptance.   Dental advisory given  Plan Discussed with: CRNA  Anesthesia Plan Comments:        Anesthesia Quick Evaluation

## 2015-01-02 ENCOUNTER — Encounter (HOSPITAL_COMMUNITY): Payer: Self-pay | Admitting: Anesthesiology

## 2015-01-02 ENCOUNTER — Encounter (HOSPITAL_COMMUNITY)
Admission: RE | Disposition: A | Discharge status: Home / Self Care | Payer: Self-pay | Source: Ambulatory Visit | Attending: Obstetrics and Gynecology

## 2015-01-02 ENCOUNTER — Inpatient Hospital Stay (HOSPITAL_COMMUNITY): Payer: 59 | Admitting: Anesthesiology

## 2015-01-02 ENCOUNTER — Inpatient Hospital Stay (HOSPITAL_COMMUNITY)
Admission: RE | Admit: 2015-01-02 | Discharge: 2015-01-04 | DRG: 742 | Disposition: A | Discharge status: Home / Self Care | Payer: 59 | Source: Ambulatory Visit | Attending: Obstetrics and Gynecology | Admitting: Obstetrics and Gynecology

## 2015-01-02 DIAGNOSIS — N920 Excessive and frequent menstruation with regular cycle: Secondary | ICD-10-CM | POA: Diagnosis present

## 2015-01-02 DIAGNOSIS — I10 Essential (primary) hypertension: Secondary | ICD-10-CM | POA: Diagnosis present

## 2015-01-02 DIAGNOSIS — E119 Type 2 diabetes mellitus without complications: Secondary | ICD-10-CM | POA: Diagnosis present

## 2015-01-02 DIAGNOSIS — N7011 Chronic salpingitis: Secondary | ICD-10-CM | POA: Diagnosis present

## 2015-01-02 DIAGNOSIS — N800 Endometriosis of uterus: Secondary | ICD-10-CM | POA: Diagnosis present

## 2015-01-02 DIAGNOSIS — D270 Benign neoplasm of right ovary: Secondary | ICD-10-CM | POA: Diagnosis present

## 2015-01-02 DIAGNOSIS — N979 Female infertility, unspecified: Secondary | ICD-10-CM | POA: Diagnosis present

## 2015-01-02 DIAGNOSIS — Z6841 Body Mass Index (BMI) 40.0 and over, adult: Secondary | ICD-10-CM | POA: Diagnosis not present

## 2015-01-02 DIAGNOSIS — Z833 Family history of diabetes mellitus: Secondary | ICD-10-CM | POA: Diagnosis not present

## 2015-01-02 DIAGNOSIS — Z8249 Family history of ischemic heart disease and other diseases of the circulatory system: Secondary | ICD-10-CM

## 2015-01-02 DIAGNOSIS — N852 Hypertrophy of uterus: Secondary | ICD-10-CM | POA: Diagnosis present

## 2015-01-02 HISTORY — PX: LAPAROTOMY: SHX154

## 2015-01-02 HISTORY — PX: LYSIS OF ADHESION: SHX5961

## 2015-01-02 LAB — PREPARE RBC (CROSSMATCH)

## 2015-01-02 LAB — GLUCOSE, CAPILLARY
Glucose-Capillary: 157 mg/dL — ABNORMAL HIGH (ref 65–99)
Glucose-Capillary: 152 mg/dL — ABNORMAL HIGH (ref 65–99)

## 2015-01-02 LAB — PREGNANCY, URINE: Preg Test, Ur: NEGATIVE

## 2015-01-02 SURGERY — LAPAROTOMY, EXPLORATORY
Anesthesia: General | Site: Abdomen | Laterality: Right

## 2015-01-02 MED ORDER — SUCCINYLCHOLINE CHLORIDE 20 MG/ML IJ SOLN
INTRAMUSCULAR | Status: DC | PRN
  Administered 2015-01-02: 140 mg via INTRAVENOUS
  Administered 2015-01-02: 10 mg via INTRAVENOUS

## 2015-01-02 MED ORDER — NEOSTIGMINE METHYLSULFATE 10 MG/10ML IV SOLN
INTRAVENOUS | Status: AC
  Filled 2015-01-02: qty 1

## 2015-01-02 MED ORDER — SCOPOLAMINE 1 MG/3DAYS TD PT72
MEDICATED_PATCH | TRANSDERMAL | Status: AC
  Filled 2015-01-02: qty 1

## 2015-01-02 MED ORDER — DEXAMETHASONE SODIUM PHOSPHATE 10 MG/ML IJ SOLN
INTRAMUSCULAR | Status: AC
  Filled 2015-01-02: qty 1

## 2015-01-02 MED ORDER — ONDANSETRON HCL 4 MG/2ML IJ SOLN
INTRAMUSCULAR | Status: AC
Start: 2015-01-02 — End: 2015-01-02
  Filled 2015-01-02: qty 2

## 2015-01-02 MED ORDER — PHENYLEPHRINE 8 MG IN D5W 100 ML (0.08MG/ML) PREMIX OPTIME
INJECTION | INTRAVENOUS | Status: DC | PRN
  Administered 2015-01-02: 30 ug/min via INTRAVENOUS

## 2015-01-02 MED ORDER — SODIUM CHLORIDE 0.9 % IJ SOLN
9.0000 mL | INTRAMUSCULAR | Status: DC | PRN
Start: 2015-01-02 — End: 2015-01-03

## 2015-01-02 MED ORDER — PNEUMOCOCCAL VAC POLYVALENT 25 MCG/0.5ML IJ INJ
0.5000 mL | INJECTION | INTRAMUSCULAR | Status: AC
  Administered 2015-01-04: 0.5 mL via INTRAMUSCULAR
  Filled 2015-01-02 (×2): qty 0.5

## 2015-01-02 MED ORDER — LIDOCAINE HCL (CARDIAC) 20 MG/ML IV SOLN
INTRAVENOUS | Status: DC | PRN
  Administered 2015-01-02: 100 mg via INTRAVENOUS

## 2015-01-02 MED ORDER — HYDROMORPHONE HCL 1 MG/ML IJ SOLN
INTRAMUSCULAR | Status: DC | PRN
  Administered 2015-01-02: 1 mg via INTRAVENOUS

## 2015-01-02 MED ORDER — HYDROCHLOROTHIAZIDE 25 MG PO TABS
25.0000 mg | ORAL_TABLET | Freq: Every day | ORAL | Status: DC
  Administered 2015-01-03 – 2015-01-04 (×2): 25 mg via ORAL
  Filled 2015-01-02 (×2): qty 1

## 2015-01-02 MED ORDER — ONDANSETRON HCL 4 MG/2ML IJ SOLN: 4.0000 mg | Freq: Four times a day (QID) | INTRAMUSCULAR | Status: DC | PRN

## 2015-01-02 MED ORDER — HYDROMORPHONE HCL 1 MG/ML IJ SOLN
0.2500 mg | INTRAMUSCULAR | Status: DC | PRN
  Administered 2015-01-02: 0.5 mg via INTRAVENOUS

## 2015-01-02 MED ORDER — VASOPRESSIN 20 UNIT/ML IV SOLN
INTRAVENOUS | Status: AC
  Filled 2015-01-02: qty 1

## 2015-01-02 MED ORDER — MENTHOL 3 MG MT LOZG: 1.0000 | LOZENGE | OROMUCOSAL | Status: DC | PRN

## 2015-01-02 MED ORDER — FENTANYL CITRATE (PF) 100 MCG/2ML IJ SOLN: INTRAMUSCULAR | Status: DC | PRN

## 2015-01-02 MED ORDER — HYDROMORPHONE 0.3 MG/ML IV SOLN
INTRAVENOUS | Status: DC
  Administered 2015-01-02: 1.79 mg via INTRAVENOUS
  Administered 2015-01-02: 1.99 mg via INTRAVENOUS
  Administered 2015-01-02: 12:00:00 via INTRAVENOUS
  Administered 2015-01-02: 0.4 mg via INTRAVENOUS
  Administered 2015-01-03: 0.957 mg via INTRAVENOUS
  Administered 2015-01-03: 1.79 mg via INTRAVENOUS
  Filled 2015-01-02: qty 25

## 2015-01-02 MED ORDER — LACTATED RINGERS IV SOLN
INTRAVENOUS | Status: DC | PRN
  Administered 2015-01-02: 08:00:00 via INTRAVENOUS

## 2015-01-02 MED ORDER — METFORMIN HCL 500 MG PO TABS: 1000.0000 mg | ORAL_TABLET | Freq: Every day | ORAL | Status: DC

## 2015-01-02 MED ORDER — FERROUS SULFATE 325 (65 FE) MG PO TABS
325.0000 mg | ORAL_TABLET | Freq: Two times a day (BID) | ORAL | Status: DC
  Administered 2015-01-03 – 2015-01-04 (×3): 325 mg via ORAL
  Filled 2015-01-02 (×4): qty 1

## 2015-01-02 MED ORDER — ONDANSETRON HCL 4 MG/2ML IJ SOLN: 4.0000 mg | Freq: Once | INTRAMUSCULAR | Status: DC | PRN

## 2015-01-02 MED ORDER — PHENYLEPHRINE 8 MG IN D5W 100 ML (0.08MG/ML) PREMIX OPTIME
INJECTION | INTRAVENOUS | Status: AC
  Filled 2015-01-02: qty 100

## 2015-01-02 MED ORDER — DEXAMETHASONE SODIUM PHOSPHATE 4 MG/ML IJ SOLN
INTRAMUSCULAR | Status: DC | PRN
  Administered 2015-01-02: 4 mg via INTRAVENOUS

## 2015-01-02 MED ORDER — PHENYLEPHRINE 40 MCG/ML (10ML) SYRINGE FOR IV PUSH (FOR BLOOD PRESSURE SUPPORT)
PREFILLED_SYRINGE | INTRAVENOUS | Status: AC
  Filled 2015-01-02: qty 20

## 2015-01-02 MED ORDER — 0.9 % SODIUM CHLORIDE (POUR BTL) OPTIME
TOPICAL | Status: DC | PRN
  Administered 2015-01-02: 1000 mL

## 2015-01-02 MED ORDER — NALOXONE HCL 0.4 MG/ML IJ SOLN: 0.4000 mg | INTRAMUSCULAR | Status: DC | PRN

## 2015-01-02 MED ORDER — FENTANYL CITRATE (PF) 100 MCG/2ML IJ SOLN
25.0000 ug | INTRAMUSCULAR | Status: DC | PRN
  Administered 2015-01-02 (×2): 50 ug via INTRAVENOUS

## 2015-01-02 MED ORDER — ROCURONIUM BROMIDE 100 MG/10ML IV SOLN
INTRAVENOUS | Status: DC | PRN
  Administered 2015-01-02: 50 mg via INTRAVENOUS

## 2015-01-02 MED ORDER — NEOSTIGMINE METHYLSULFATE 10 MG/10ML IV SOLN
INTRAVENOUS | Status: DC | PRN
  Administered 2015-01-02: 3 mg via INTRAVENOUS

## 2015-01-02 MED ORDER — HYDROMORPHONE HCL 1 MG/ML IJ SOLN
INTRAMUSCULAR | Status: AC
  Filled 2015-01-02: qty 1

## 2015-01-02 MED ORDER — METFORMIN HCL 500 MG PO TABS
1000.0000 mg | ORAL_TABLET | Freq: Every day | ORAL | Status: DC
  Administered 2015-01-03 – 2015-01-04 (×2): 1000 mg via ORAL
  Filled 2015-01-02 (×3): qty 2

## 2015-01-02 MED ORDER — MIDAZOLAM HCL 2 MG/2ML IJ SOLN
INTRAMUSCULAR | Status: AC
  Filled 2015-01-02: qty 2

## 2015-01-02 MED ORDER — FENTANYL CITRATE (PF) 250 MCG/5ML IJ SOLN
INTRAMUSCULAR | Status: AC
  Filled 2015-01-02: qty 5

## 2015-01-02 MED ORDER — PROPOFOL 10 MG/ML IV BOLUS
INTRAVENOUS | Status: DC | PRN
  Administered 2015-01-02: 200 mg via INTRAVENOUS

## 2015-01-02 MED ORDER — BENAZEPRIL HCL 10 MG PO TABS
10.0000 mg | ORAL_TABLET | Freq: Every day | ORAL | Status: DC
  Filled 2015-01-02: qty 1

## 2015-01-02 MED ORDER — KETOROLAC TROMETHAMINE 30 MG/ML IJ SOLN
INTRAMUSCULAR | Status: DC | PRN
  Administered 2015-01-02: 30 mg via INTRAVENOUS

## 2015-01-02 MED ORDER — LOSARTAN POTASSIUM-HCTZ 100-25 MG PO TABS: 1.0000 | ORAL_TABLET | Freq: Every day | ORAL | Status: DC

## 2015-01-02 MED ORDER — KETOROLAC TROMETHAMINE 30 MG/ML IJ SOLN
30.0000 mg | Freq: Three times a day (TID) | INTRAMUSCULAR | Status: AC
  Administered 2015-01-02 – 2015-01-03 (×3): 30 mg via INTRAVENOUS
  Filled 2015-01-02 (×3): qty 1

## 2015-01-02 MED ORDER — LIDOCAINE HCL (CARDIAC) 20 MG/ML IV SOLN
INTRAVENOUS | Status: AC
  Filled 2015-01-02: qty 5

## 2015-01-02 MED ORDER — SCOPOLAMINE 1 MG/3DAYS TD PT72
MEDICATED_PATCH | TRANSDERMAL | Status: DC
Start: 2015-01-02 — End: 2015-01-04
  Administered 2015-01-02: 1.5 mg via TRANSDERMAL
  Filled 2015-01-02: qty 1

## 2015-01-02 MED ORDER — ONDANSETRON HCL 4 MG PO TABS: 4.0000 mg | ORAL_TABLET | Freq: Four times a day (QID) | ORAL | Status: DC | PRN

## 2015-01-02 MED ORDER — KETOROLAC TROMETHAMINE 30 MG/ML IJ SOLN
INTRAMUSCULAR | Status: AC
  Filled 2015-01-02: qty 1

## 2015-01-02 MED ORDER — ROCURONIUM BROMIDE 100 MG/10ML IV SOLN
INTRAVENOUS | Status: AC
  Filled 2015-01-02: qty 1

## 2015-01-02 MED ORDER — MIDAZOLAM HCL 2 MG/2ML IJ SOLN
INTRAMUSCULAR | Status: DC | PRN
  Administered 2015-01-02: 2 mg via INTRAVENOUS

## 2015-01-02 MED ORDER — FENTANYL CITRATE (PF) 250 MCG/5ML IJ SOLN
INTRAMUSCULAR | Status: DC | PRN
  Administered 2015-01-02: 100 ug via INTRAVENOUS
  Administered 2015-01-02: 50 ug via INTRAVENOUS
  Administered 2015-01-02: 100 ug via INTRAVENOUS

## 2015-01-02 MED ORDER — PROPOFOL 10 MG/ML IV BOLUS
INTRAVENOUS | Status: AC
  Filled 2015-01-02: qty 20

## 2015-01-02 MED ORDER — LACTATED RINGERS IV SOLN
INTRAVENOUS | Status: DC
  Administered 2015-01-02 (×2): via INTRAVENOUS

## 2015-01-02 MED ORDER — FENTANYL CITRATE (PF) 100 MCG/2ML IJ SOLN
INTRAMUSCULAR | Status: AC
  Filled 2015-01-02: qty 2

## 2015-01-02 MED ORDER — AMLODIPINE BESYLATE 5 MG PO TABS
5.0000 mg | ORAL_TABLET | Freq: Every day | ORAL | Status: DC
  Administered 2015-01-03 – 2015-01-04 (×2): 5 mg via ORAL
  Filled 2015-01-02 (×3): qty 1

## 2015-01-02 MED ORDER — DIPHENHYDRAMINE HCL 50 MG/ML IJ SOLN: 12.5000 mg | Freq: Four times a day (QID) | INTRAMUSCULAR | Status: DC | PRN

## 2015-01-02 MED ORDER — SODIUM CHLORIDE 0.9 % IJ SOLN
INTRAMUSCULAR | Status: AC
  Filled 2015-01-02: qty 100

## 2015-01-02 MED ORDER — PHENYLEPHRINE HCL 10 MG/ML IJ SOLN
INTRAMUSCULAR | Status: DC | PRN
  Administered 2015-01-02 (×4): 80 ug via INTRAVENOUS
  Administered 2015-01-02: 40 ug via INTRAVENOUS

## 2015-01-02 MED ORDER — LACTATED RINGERS IV SOLN
INTRAVENOUS | Status: DC
  Administered 2015-01-02 (×3): via INTRAVENOUS

## 2015-01-02 MED ORDER — ONDANSETRON HCL 4 MG/2ML IJ SOLN
INTRAMUSCULAR | Status: DC | PRN
  Administered 2015-01-02: 4 mg via INTRAVENOUS

## 2015-01-02 MED ORDER — ACETAMINOPHEN 10 MG/ML IV SOLN
1000.0000 mg | Freq: Once | INTRAVENOUS | Status: AC
Start: 2015-01-02 — End: 2015-01-02
  Administered 2015-01-02: 1000 mg via INTRAVENOUS
  Filled 2015-01-02: qty 100

## 2015-01-02 MED ORDER — DIPHENHYDRAMINE HCL 12.5 MG/5ML PO ELIX
12.5000 mg | ORAL_SOLUTION | Freq: Four times a day (QID) | ORAL | Status: DC | PRN
  Administered 2015-01-03: 12.5 mg via ORAL
  Filled 2015-01-02: qty 5

## 2015-01-02 MED ORDER — AMLODIPINE BESY-BENAZEPRIL HCL 5-10 MG PO CAPS: 1.0000 | ORAL_CAPSULE | Freq: Every day | ORAL | Status: DC

## 2015-01-02 MED ORDER — GLYCOPYRROLATE 0.2 MG/ML IJ SOLN
INTRAMUSCULAR | Status: DC | PRN
  Administered 2015-01-02: .6 mg via INTRAVENOUS
  Administered 2015-01-02: 0.1 mg via INTRAVENOUS

## 2015-01-02 MED ORDER — LOSARTAN POTASSIUM 50 MG PO TABS
100.0000 mg | ORAL_TABLET | Freq: Every day | ORAL | Status: DC
  Administered 2015-01-03 – 2015-01-04 (×2): 100 mg via ORAL
  Filled 2015-01-02 (×3): qty 2

## 2015-01-02 MED ORDER — SCOPOLAMINE 1 MG/3DAYS TD PT72
1.0000 | MEDICATED_PATCH | Freq: Once | TRANSDERMAL | Status: DC
  Administered 2015-01-02: 1.5 mg via TRANSDERMAL

## 2015-01-02 SURGICAL SUPPLY — 31 items
BARRIER ADHS 3X4 INTERCEED (GAUZE/BANDAGES/DRESSINGS) IMPLANT
CANISTER SUCT 3000ML (MISCELLANEOUS) ×6 IMPLANT
CHLORAPREP W/TINT 26ML (MISCELLANEOUS) ×12 IMPLANT
CLOTH BEACON ORANGE TIMEOUT ST (SAFETY) ×6 IMPLANT
CONT PATH 16OZ SNAP LID 3702 (MISCELLANEOUS) ×6 IMPLANT
DRAPE CESAREAN BIRTH W POUCH (DRAPES) ×6 IMPLANT
DRSG OPSITE POSTOP 4X10 (GAUZE/BANDAGES/DRESSINGS) ×6 IMPLANT
GLOVE BIO SURGEON STRL SZ 6.5 (GLOVE) ×10 IMPLANT
GLOVE BIO SURGEONS STRL SZ 6.5 (GLOVE) ×2
GLOVE BIOGEL PI IND STRL 7.0 (GLOVE) ×24 IMPLANT
GLOVE BIOGEL PI INDICATOR 7.0 (GLOVE) ×12
GOWN STRL REUS W/TWL LRG LVL3 (GOWN DISPOSABLE) ×18 IMPLANT
HEMOSTAT SURGICEL 4X8 (HEMOSTASIS) IMPLANT
LIQUID BAND (GAUZE/BANDAGES/DRESSINGS) ×6 IMPLANT
NS IRRIG 1000ML POUR BTL (IV SOLUTION) ×6 IMPLANT
PACK ABDOMINAL GYN (CUSTOM PROCEDURE TRAY) ×6 IMPLANT
PAD OB MATERNITY 4.3X12.25 (PERSONAL CARE ITEMS) ×6 IMPLANT
SPONGE LAP 18X18 X RAY DECT (DISPOSABLE) ×6 IMPLANT
STAPLER VISISTAT 35W (STAPLE) IMPLANT
SUT MNCRL 0 MO-4 VIOLET 18 CR (SUTURE) ×4 IMPLANT
SUT MNCRL AB 0 CT1 27 (SUTURE) ×6 IMPLANT
SUT MON AB 2-0 CT1 36 (SUTURE) ×12 IMPLANT
SUT MONOCRYL 0 MO 4 18  CR/8 (SUTURE) ×2
SUT PDS AB 0 CTX 60 (SUTURE) ×12 IMPLANT
SUT PLAIN 2 0 XLH (SUTURE) ×6 IMPLANT
SUT VIC AB 0 CT1 27 (SUTURE)
SUT VIC AB 0 CT1 27XBRD ANBCTR (SUTURE) IMPLANT
SUT VIC AB 4-0 KS 27 (SUTURE) ×6 IMPLANT
TOWEL OR 17X24 6PK STRL BLUE (TOWEL DISPOSABLE) ×12 IMPLANT
TRAY FOLEY CATH SILVER 14FR (SET/KITS/TRAYS/PACK) ×6 IMPLANT
WATER STERILE IRR 1000ML POUR (IV SOLUTION) ×6 IMPLANT

## 2015-01-02 NOTE — Transfer of Care (Signed)
Immediate Anesthesia Transfer of Care Note  Patient: Amy Banks  Procedure(s) Performed: Procedure(s): EXPLORATORY LAPAROTOMY WITH RIGHT OVARIAN CYSTECTOMY (Right) LYSIS OF ADHESION (N/A)  Patient Location: PACU  Anesthesia Type:General  Level of Consciousness: awake, alert  and oriented  Airway & Oxygen Therapy: Patient Spontanous Breathing and Patient connected to nasal cannula oxygen  Post-op Assessment: Report given to RN and Post -op Vital signs reviewed and stable  Post vital signs: Reviewed and stable  Last Vitals:  Filed Vitals:   01/02/15 0558  BP: 155/94  Pulse: 99  Temp: 36.8 C  Resp: 20    Complications: No apparent anesthesia complications

## 2015-01-02 NOTE — Anesthesia Procedure Notes (Signed)
Procedure Name: Intubation Date/Time: 01/02/2015 7:36 AM Performed by: Flossie Dibble Pre-anesthesia Checklist: Patient identified, Timeout performed, Emergency Drugs available, Suction available and Patient being monitored Patient Re-evaluated:Patient Re-evaluated prior to inductionOxygen Delivery Method: Circle system utilized Preoxygenation: Pre-oxygenation with 100% oxygen Intubation Type: IV induction Ventilation: Oral airway inserted - appropriate to patient size and Mask ventilation without difficulty Laryngoscope Size: Glidescope and 3 Grade View: Grade I Tube type: Oral Tube size: 7.0 mm Number of attempts: 1 Airway Equipment and Method: Stylet and Patient positioned with wedge pillow Placement Confirmation: ETT inserted through vocal cords under direct vision,  positive ETCO2 and breath sounds checked- equal and bilateral Secured at: 20 cm Tube secured with: Tape Dental Injury: Teeth and Oropharynx as per pre-operative assessment  Difficulty Due To: Difficulty was anticipated

## 2015-01-02 NOTE — Anesthesia Postprocedure Evaluation (Signed)
  Anesthesia Post-op Note  Patient: Amy Banks  Procedure(s) Performed: Procedure(s): EXPLORATORY LAPAROTOMY WITH RIGHT OVARIAN CYSTECTOMY (Right) LYSIS OF ADHESION (N/A)  Patient Location: [facility]  Anesthesia Type:General  Level of Consciousness: awake  Airway and Oxygen Therapy: Patient Spontanous Breathing  Post-op Pain: mild  Post-op Assessment: Patient's Cardiovascular Status Stable and Respiratory Function Stable              Post-op Vital Signs: stable  Last Vitals:  Filed Vitals:   01/02/15 1443  BP: 127/74  Pulse: 60  Temp: 36.9 C  Resp: 18    Complications: No apparent anesthesia complications

## 2015-01-02 NOTE — Anesthesia Postprocedure Evaluation (Signed)
  Anesthesia Post-op Note  Patient: Amy Banks  Procedure(s) Performed: Procedure(s) (LRB): EXPLORATORY LAPAROTOMY WITH RIGHT OVARIAN CYSTECTOMY (Right) LYSIS OF ADHESION (N/A)  Patient Location: PACU  Anesthesia Type: General  Level of Consciousness: awake and alert   Airway and Oxygen Therapy: Patient Spontanous Breathing  Post-op Pain: mild  Post-op Assessment: Post-op Vital signs reviewed, Patient's Cardiovascular Status Stable, Respiratory Function Stable, Patent Airway and No signs of Nausea or vomiting  Last Vitals:  Filed Vitals:   01/02/15 0945  BP: 151/82  Pulse: 68  Temp:   Resp:     Post-op Vital Signs: stable   Complications: No apparent anesthesia complications

## 2015-01-02 NOTE — Addendum Note (Signed)
Addendum  created 01/02/15 1517 by Ignacia Bayley, CRNA   Modules edited: Notes Section   Notes Section:  File: [id number]

## 2015-01-02 NOTE — Progress Notes (Signed)
Day of Surgery Procedure(s) (LRB): EXPLORATORY LAPAROTOMY WITH RIGHT OVARIAN CYSTECTOMY (Right) LYSIS OF ADHESION (N/A)  Subjective: Patient reports tolerating PO.  Pain controlled with IV PCA  Objective: I have reviewed patient's vital signs, intake and output, medications and labs.  General: alert and cooperative GI: normal findings: soft, non-tender Extremities: extremities normal, atraumatic, no cyanosis or edema Vaginal Bleeding: none  Assessment: s/p Procedure(s): EXPLORATORY LAPAROTOMY WITH RIGHT OVARIAN CYSTECTOMY (Right) LYSIS OF ADHESION (N/A): progressing well  Plan: routine post op care  Discussed surgical findings at length with pt and family Discussed using mirena IUD or [place] for control of anemia.   LOS: 0 days    Jeran Hiltz 01/02/2015, 2:02 PM

## 2015-01-03 ENCOUNTER — Encounter (HOSPITAL_COMMUNITY): Payer: Self-pay | Admitting: Obstetrics and Gynecology

## 2015-01-03 LAB — COMPREHENSIVE METABOLIC PANEL
ALT: 12 U/L — ABNORMAL LOW (ref 14–54)
AST: 28 U/L (ref 15–41)
Albumin: 3.5 g/dL (ref 3.5–5.0)
Alkaline Phosphatase: 60 U/L (ref 38–126)
Anion gap: 5 (ref 5–15)
BUN: 9 mg/dL (ref 6–20)
CHLORIDE: 98 mmol/L — AB (ref 101–111)
CO2: 31 mmol/L (ref 22–32)
CREATININE: 0.94 mg/dL (ref 0.44–1.00)
Calcium: 9.1 mg/dL (ref 8.9–10.3)
GFR calc Af Amer: >60 mL/min (ref >60)
GFR calc non Af Amer: >60 mL/min (ref >60)
GLUCOSE: 141 mg/dL — AB (ref 65–99)
POTASSIUM: 4 mmol/L (ref 3.5–5.1)
SODIUM: 134 mmol/L — AB (ref 135–145)
TOTAL PROTEIN: 7.1 g/dL (ref 6.5–8.1)
Total Bilirubin: 0.3 mg/dL (ref 0.3–1.2)

## 2015-01-03 LAB — CBC
HEMATOCRIT: 32.2 % — AB (ref 36.0–46.0)
HEMOGLOBIN: 9.4 g/dL — AB (ref 12.0–15.0)
MCH: 22.3 pg — ABNORMAL LOW (ref 26.0–34.0)
MCHC: 29.2 g/dL — AB (ref 30.0–36.0)
MCV: 76.5 fL — ABNORMAL LOW (ref 78.0–100.0)
Platelets: 298 10*3/uL (ref 150–400)
RBC: 4.21 MIL/uL (ref 3.87–5.11)
RDW: 26.5 % — ABNORMAL HIGH (ref 11.5–15.5)
WBC: 12.7 10*3/uL — ABNORMAL HIGH (ref 4.0–10.5)

## 2015-01-03 MED ORDER — OXYCODONE-ACETAMINOPHEN 5-325 MG PO TABS
1.0000 | ORAL_TABLET | ORAL | Status: DC | PRN
  Administered 2015-01-03 (×2): 2 via ORAL
  Administered 2015-01-04 (×2): 1 via ORAL
  Filled 2015-01-03 (×2): qty 1
  Filled 2015-01-03: qty 2
  Filled 2015-01-03: qty 1
  Filled 2015-01-03: qty 2

## 2015-01-03 MED ORDER — MEDROXYPROGESTERONE ACETATE 10 MG PO TABS
10.0000 mg | ORAL_TABLET | Freq: Every day | ORAL | Status: DC
  Administered 2015-01-03 – 2015-01-04 (×2): 10 mg via ORAL
  Filled 2015-01-03 (×2): qty 1

## 2015-01-03 MED ORDER — IBUPROFEN 600 MG PO TABS
600.0000 mg | ORAL_TABLET | Freq: Four times a day (QID) | ORAL | Status: DC | PRN
  Administered 2015-01-03 – 2015-01-04 (×3): 600 mg via ORAL
  Filled 2015-01-03 (×4): qty 1

## 2015-01-03 NOTE — Progress Notes (Signed)
1 Day Post-Op Procedure(s) (LRB): EXPLORATORY LAPAROTOMY WITH RIGHT OVARIAN CYSTECTOMY (Right) LYSIS OF ADHESION (N/A)  Subjective: Patient reports tolerating PO, + flatus and no problems voiding.    Objective: I have reviewed patient's vital signs, intake and output, medications and labs.  General: alert and cooperative GI: normal findings: soft, non-tender Extremities: extremities normal, atraumatic, no cyanosis or edema Vaginal Bleeding: none  Assessment: s/p Procedure(s): EXPLORATORY LAPAROTOMY WITH RIGHT OVARIAN CYSTECTOMY (Right) LYSIS OF ADHESION (N/A): progressing well  Plan: Advance diet Encourage ambulation Advance to PO medication Discontinue IV fluids  LOS: 1 day    Amy Banks 01/03/2015, 8:56 AM

## 2015-01-03 NOTE — Op Note (Signed)
[name], Amy Banks              ACCOUNT NO.:  [id number]  MEDICAL RECORD NO.:  [id number]  LOCATION:  9303                          FACILITY:  [facility]  PHYSICIAN:  Marylynn Pearson, MD    DATE OF BIRTH:  10-13-1975  DATE OF PROCEDURE:  01/02/2015 DATE OF DISCHARGE:                              OPERATIVE REPORT   PREOPERATIVE DIAGNOSES: 1. Menorrhagia. 2. Infertility. 3. Fibroids. 4. Left-sided hydrosalpinx.  POSTOPERATIVE DIAGNOSES: 1. Menorrhagia. 2. Infertility. 3. Right ovarian dermoid.  PROCEDURE: 1. Exploratory laparotomy. 2. Right ovarian cystectomy. 3. Lysis of adhesions.  SURGEON:  Marylynn Pearson, MD  ASSISTANT:  Ralene Bathe. Matthew Saras, M.D.  BLOOD LOSS:  Minimal.  URINE OUTPUT:  Clear.  SPECIMENS:  Right ovarian dermoid to Pathology.  COMPLICATIONS:  None.  CONDITION:  Stable to recovery room.  DESCRIPTION OF PROCEDURE:  The patient was taken to the operating room, where she was given a second IV and general anesthesia.  Once anesthesia was adequate, she was prepped and draped in sterile fashion and Foley catheter was inserted sterilely.  Pfannenstiel skin incision was made with a scalpel and extended to the level of the fascia.  The fascia was extended laterally using curved Mayo scissors.  Kocher clamps were used to grab the superior and inferior portion of the fascia.  The underlying rectus muscles were dissected off using the scalpel and curved Mayo scissors.  Peritoneum was identified and entered sharply.  This was extended superiorly and inferiorly.  Findings that were noted at the time of entering the peritoneal cavity, she had thin adhesions of the colon to the fundus and posterior uterus. She had a right ovarian cyst, which was adherent to the right side of her uterus.  She had dense adhesions along the left side of the uterus. The proximal portion of the left fallopian tube could be visualized and did not appear dilated and swollen.  The  fimbriated end and left ovary could not be visualized.  The uterus was palpated on the anterior and posterior surface.  Large 9-cm fibroid that was documented on ultrasound could not be palpated.  The uterus palpated soft consistent with likely adenomyosis.  Some thin filmy adhesions were dissected between the uterine fundus and colon on the anterior side of the uterus.  The decision was made to remove right ovarian cyst as it palpated firm and had characteristic findings of a dermoid.  A superficial incision was made with the scalpel over the right ovary. Ovarian serosa was grasped with Allis clamps and the cyst was shelled out using sharp and blunt dissection.  Ovarian capsule was reapproximated, minimal bleeding noted, excellent hemostasis was noted. After a long conversation in the operating room, decision was made not to make an incision in the uterus as a large 9-cm fibroid could not be palpated, concern for hemorrhage.  At this time, the pelvis was irrigated.  Peritoneum was closed with Monocryl, fascia was closed with PDS, and the skin was closed with Vicryl.  Sponge, lap, needle, and instrument counts were correct x2.  She was extubated and taken to the recovery room in stable condition.  Sponge, lap, needle, instrument counts were correct.     Marylynn Pearson,  MD     GA/MEDQ  D:  01/02/2015  T:  01/03/2015  Job:  [id number]

## 2015-01-04 MED ORDER — MEDROXYPROGESTERONE ACETATE 10 MG PO TABS: 10.0000 mg | ORAL_TABLET | Freq: Every day | ORAL | Status: DC

## 2015-01-04 MED ORDER — IBUPROFEN 600 MG PO TABS: 600.0000 mg | ORAL_TABLET | Freq: Four times a day (QID) | ORAL | Status: DC | PRN

## 2015-01-04 MED ORDER — OXYCODONE-ACETAMINOPHEN 5-325 MG PO TABS: 1.0000 | ORAL_TABLET | ORAL | Status: DC | PRN

## 2015-01-04 NOTE — Progress Notes (Signed)
Patient discharged home with significant other... Discharge instructions reviewed with patient and she verbalized understanding... Condition stable... No equipment... Ambulated to car with Astrid Divine, NT.

## 2015-01-04 NOTE — Discharge Instructions (Signed)
Laparotomy °Care After °Refer to this sheet in the next few weeks. These instructions provide you with information on caring for yourself after your procedure. Your caregiver may also give you more specific instructions. Your treatment has been planned according to current medical practices, but problems sometimes occur. Call your caregiver if you have any problems or questions after your procedure. °HOME CARE INSTRUCTIONS °ACTIVITY °· Rest as much as possible the first two weeks at home. °· Avoid strenuous activity such as heavy lifting (more than 10 pounds), pushing, or pulling. Limit stair climbing to once or twice a day for the first week, then slowly increase this activity. °· Take frequent rest periods throughout the day. °· Talk with your caregiver about when you may resume your usual physical activity. °· You need to be out of bed and walking as much as possible. This decreases the chance of: °¨ Blood clots. °¨ Pneumonia. °NUTRITION °· You can resume your normal diet once you regain bowel function. °· Drink plenty of fluids (6-8 glasses a day or as instructed by your caregiver). °· Eat a well-balanced diet. °· Daily portions of food from the meat (protein), milk, vegetable, and bread groups are necessary for your health. °ELIMINATION °It is very important not to strain during bowel movements. If constipation should occur, you may: °· Take a mild laxative. °· Add fruit and bran to your diet. °· Drink more fluids. °HYGIENE °· Take showers, not baths, until 4-6 weeks after surgery. °· If your incision is closed, you may take a shower or tub bath. °FEVER °If you feel feverish or have shaking chills, take your temperature. If it is 102° F (38.9° C), call your caregiver. The fever may mean there is an infection. °PAIN CONTROL °· Mild discomfort may occur. °· Only take over-the-counter or prescription medicines for pain, discomfort, or fever as directed by your caregiver. Take any prescribed medicines exactly as  directed. °INCISION CARE °· Keep your incision site clean with soap and water. °· Do not use a dressing unless your cut (incision) from surgery is draining or irritated. °· If you have small adhesive strips in place and they do not fall off within 10 days, carefully peel them off. °· Check your incision and surrounding area daily for any redness, swelling, discoloration, heavy drainage, or separation of the skin. °SEXUAL INTERCOURSE °Do not have sexual intercourse until after your follow-up appointment, unless your caregiver tells you otherwise. °SEEK MEDICAL CARE IF:  °· You are unable to tolerate food or drinks. °· You are unable to pass gas or have a bowel movement. °· Your pain becomes more severe or is not relieved with medicines. °· You have redness, swelling, discoloration, heavy drainage, or separation of the skin at the incision site. °Document Released: 01/02/2004 Document Revised: 05/06/2012 Document Reviewed: 05/19/2007 °ExitCare® Patient Information ©2015 [employer]. This information is not intended to replace advice given to you by your health care provider. Make sure you discuss any questions you have with your health care provider. ° °

## 2015-01-04 NOTE — Discharge Summary (Signed)
Physician Discharge Summary  Patient ID: KERIN KREN MRN: [id number] DOB/AGE: September 02, 1975 39 y.o.  Admit date: 01/02/2015 Discharge date: 01/04/2015  Admission Diagnoses:  menorrhagia  Discharge Diagnoses:  Active Problems:   Menorrhagia   Discharged Condition: stable  Hospital Course: pt was admitted for routine postop care.  She remained afebrile with stable VS and labs.  Pain was initially controlled with IV meds and advanced to PO pain control as her diet advanced.  Foley was removed on POD1 and she was able to ambulate and void without difficulty.    Consults: None  Significant Diagnostic Studies: labs: cbc  Treatments: surgery: ex laparotomy with right ovarian cystectomy  Discharge Exam: Blood pressure 148/88, pulse 57, temperature 98.3 F (36.8 C), temperature source Oral, resp. rate 18, height 5\' 6"  (1.676 m), weight 326 lb (147.873 kg), SpO2 99 %. General appearance: alert and cooperative GI: normal findings: soft, non-tender Extremities: extremities normal, atraumatic, no cyanosis or edema Incision/Wound: c/d/i  Disposition: 01-Home or Self Care     Medication List    TAKE these medications        amLODipine 5 MG tablet  Commonly known as:  NORVASC  Take 5 mg by mouth daily.     ferrous sulfate 325 (65 FE) MG tablet  Take 1 tablet (325 mg total) by mouth 2 (two) times daily with a meal.     ibuprofen 600 MG tablet  Commonly known as:  ADVIL,MOTRIN  Take 1 tablet (600 mg total) by mouth every 6 (six) hours as needed for mild pain.     losartan-hydrochlorothiazide 100-25 MG per tablet  Commonly known as:  HYZAAR  Take 1 tablet by mouth daily.     medroxyPROGESTERone 10 MG tablet  Commonly known as:  PROVERA  Take 1 tablet (10 mg total) by mouth daily.     metFORMIN 1000 MG tablet  Commonly known as:  GLUCOPHAGE  Take 1,000 mg by mouth daily after supper.     oxyCODONE-acetaminophen 5-325 MG per tablet  Commonly known as:  PERCOCET/ROXICET  Take  1-2 tablets by mouth every 4 (four) hours as needed for moderate pain.           Follow-up Information    Schedule an appointment as soon as possible for a visit in 2 weeks to follow up.      Signed: Katlin Banks 01/04/2015, 8:42 AM

## 2015-01-06 LAB — TYPE AND SCREEN
ABO/RH(D): A POS
Antibody Screen: NEGATIVE
Unit division: 0
Unit division: 0
Unit division: 0

## 2015-01-15 ENCOUNTER — Encounter (HOSPITAL_COMMUNITY): Payer: Self-pay

## 2015-01-15 ENCOUNTER — Inpatient Hospital Stay (HOSPITAL_COMMUNITY)
Admission: AD | Admit: 2015-01-15 | Discharge: 2015-01-15 | Disposition: A | Discharge status: Home / Self Care | Payer: 59 | Source: Ambulatory Visit | Attending: Obstetrics and Gynecology | Admitting: Obstetrics and Gynecology

## 2015-01-15 DIAGNOSIS — N939 Abnormal uterine and vaginal bleeding, unspecified: Secondary | ICD-10-CM | POA: Insufficient documentation

## 2015-01-15 DIAGNOSIS — N920 Excessive and frequent menstruation with regular cycle: Secondary | ICD-10-CM

## 2015-01-15 DIAGNOSIS — Z885 Allergy status to narcotic agent status: Secondary | ICD-10-CM | POA: Diagnosis not present

## 2015-01-15 DIAGNOSIS — D649 Anemia, unspecified: Secondary | ICD-10-CM | POA: Diagnosis not present

## 2015-01-15 DIAGNOSIS — N946 Dysmenorrhea, unspecified: Secondary | ICD-10-CM | POA: Diagnosis not present

## 2015-01-15 DIAGNOSIS — I10 Essential (primary) hypertension: Secondary | ICD-10-CM | POA: Insufficient documentation

## 2015-01-15 DIAGNOSIS — R109 Unspecified abdominal pain: Secondary | ICD-10-CM | POA: Insufficient documentation

## 2015-01-15 LAB — CBC WITH DIFFERENTIAL/PLATELET
BASOS PCT: 0 % (ref 0–1)
Basophils Absolute: 0 10*3/uL (ref 0.0–0.1)
Eosinophils Absolute: 0.1 10*3/uL (ref 0.0–0.7)
Eosinophils Relative: 1 % (ref 0–5)
HCT: 35 % — ABNORMAL LOW (ref 36.0–46.0)
Hemoglobin: 10.5 g/dL — ABNORMAL LOW (ref 12.0–15.0)
LYMPHS ABS: 2.2 10*3/uL (ref 0.7–4.0)
LYMPHS PCT: 22 % (ref 12–46)
MCH: 23.1 pg — ABNORMAL LOW (ref 26.0–34.0)
MCHC: 30 g/dL (ref 30.0–36.0)
MCV: 76.9 fL — AB (ref 78.0–100.0)
MONO ABS: 0.6 10*3/uL (ref 0.1–1.0)
MONOS PCT: 6 % (ref 3–12)
NEUTROS ABS: 7.2 10*3/uL (ref 1.7–7.7)
NEUTROS PCT: 71 % (ref 43–77)
Platelets: 417 10*3/uL — ABNORMAL HIGH (ref 150–400)
RBC: 4.55 MIL/uL (ref 3.87–5.11)
RDW: 25.6 % — ABNORMAL HIGH (ref 11.5–15.5)
WBC: 10.1 10*3/uL (ref 4.0–10.5)

## 2015-01-15 MED ORDER — OXYCODONE-ACETAMINOPHEN 5-325 MG PO TABS: 1.0000 | ORAL_TABLET | ORAL | Status: DC | PRN

## 2015-01-15 MED ORDER — HYDROMORPHONE HCL 1 MG/ML IJ SOLN
INTRAMUSCULAR | Status: DC
Start: 2015-01-15 — End: 2015-01-15
  Filled 2015-01-15: qty 1

## 2015-01-15 MED ORDER — HYDROMORPHONE HCL 1 MG/ML IJ SOLN
1.0000 mg | Freq: Once | INTRAMUSCULAR | Status: AC
  Administered 2015-01-15: 1 mg via INTRAMUSCULAR

## 2015-01-15 NOTE — MAU Provider Note (Signed)
History     CSN: [id number]  Arrival date and time: 01/15/15 6948   First Provider Initiated Contact with Patient 01/15/15 [phone]      Chief Complaint  Patient presents with  . Post-op Problem   HPI  [name] is a 39 y.o. G0 who presents to MAU today with complaint of abdominal pain and vaginal bleeding. The patient had exploratory laparotomy with right ovarian cystectomy and lysis of adhesions on 01/02/15. She also has an adenomyoma which causes heavy painful periods. She has Percocet and Ibuprofen for pain. Last took 1 Percocet around 0400 today and Ibuprofen was yesterday. She rates pain at 10/10 now in the lower abdomen. She states that her pain was well-controlled and much improved since the surgery until yesterday when her period started. She states that she has used 4 pads since 0400 today. She has also passed some moderate sized clots. She denies feeling faint or dizzy, but has had some weakness. She is also concerned because she woke up sweating last night but did not take her temperature. She has a post operative appointment on Wednesday and plans to have Mirena IUD inserted for bleeding control at that time.   OB History    Gravida Para Term Preterm AB TAB SAB Ectopic Multiple Living   0 0 0 0 0 0 0 0 0 0       Past Medical History  Diagnosis Date  . Diabetes mellitus without complication   . Hypertension   . Diverticulitis 08/2013  . Anemia     Past Surgical History  Procedure Laterality Date  . Breast surgery      reconstruction  . Myomectomy    . Laparotomy Right 01/02/2015    Procedure: EXPLORATORY LAPAROTOMY WITH RIGHT OVARIAN CYSTECTOMY;  Surgeon: Marylynn Pearson, MD;  Location: [facility];  Service: Gynecology;  Laterality: Right;  . Lysis of adhesion N/A 01/02/2015    Procedure: LYSIS OF ADHESION;  Surgeon: Marylynn Pearson, MD;  Location: [facility];  Service: Gynecology;  Laterality: N/A;    Family History  Problem Relation Age of Onset  . Hypertension    .  Diabetes    . Breast cancer    . Ovarian cancer      Social History  Substance Use Topics  . Smoking status: Never Smoker   . Smokeless tobacco: Never Used  . Alcohol Use: No    Allergies:  Allergies  Allergen Reactions  . Codeine Hives and Swelling    Prescriptions prior to admission  Medication Sig Dispense Refill Last Dose  . amLODipine (NORVASC) 5 MG tablet Take 5 mg by mouth daily.   01/14/2015 at Unknown time  . ferrous sulfate 325 (65 FE) MG tablet Take 1 tablet (325 mg total) by mouth 2 (two) times daily with a meal. 30 tablet 0 01/14/2015 at Unknown time  . ibuprofen (ADVIL,MOTRIN) 600 MG tablet Take 1 tablet (600 mg total) by mouth every 6 (six) hours as needed for mild pain. 30 tablet 0 01/14/2015 at Unknown time  . losartan-hydrochlorothiazide (HYZAAR) 100-25 MG per tablet Take 1 tablet by mouth daily.   01/15/2015 at Unknown time  . medroxyPROGESTERone (PROVERA) 10 MG tablet Take 1 tablet (10 mg total) by mouth daily. 30 tablet 3 01/14/2015 at Unknown time  . metFORMIN (GLUCOPHAGE) 1000 MG tablet Take 1,000 mg by mouth daily after supper.    01/14/2015 at Unknown time  . [DISCONTINUED] oxyCODONE-acetaminophen (PERCOCET/ROXICET) 5-325 MG per tablet Take 1-2 tablets by mouth every 4 (  four) hours as needed for moderate pain. 30 tablet 0 01/15/2015 at Unknown time    Review of Systems  Constitutional: Negative for fever and malaise/fatigue.  Gastrointestinal: Positive for abdominal pain. Negative for nausea, vomiting, diarrhea and constipation.  Genitourinary:       + vaginal bleeding  Neurological: Positive for weakness. Negative for dizziness and loss of consciousness.   Physical Exam   Blood pressure 160/95, pulse 72, temperature 98.7 F (37.1 C), temperature source Oral, resp. rate 18, last menstrual period 01/14/2015.  Physical Exam  Nursing note and vitals reviewed. Constitutional: She is oriented to person, place, and time. She appears well-developed and  well-nourished. No distress.  HENT:  Head: Normocephalic and atraumatic.  Cardiovascular: Normal rate.   Respiratory: Effort normal.  GI: Soft. She exhibits no distension and no mass. There is tenderness (moderate tenderness to palpation of the lower abdomen bilaterally). There is no rebound and no guarding.  Surgical incision is well healed without erythema, edema or drainage  Genitourinary:  Pad is half filled after 1 hour, small clot  Neurological: She is alert and oriented to person, place, and time.  Skin: Skin is warm and dry. No erythema.  Psychiatric: She has a normal mood and affect.   Results for orders placed or performed during the hospital encounter of 01/15/15 (from the past 24 hour(s))  CBC with Differential/Platelet     Status: Abnormal   Collection Time: 01/15/15  8:24 AM  Result Value Ref Range   WBC 10.1 4.0 - 10.5 K/uL   RBC 4.55 3.87 - 5.11 MIL/uL   Hemoglobin 10.5 (L) 12.0 - 15.0 g/dL   HCT 35.0 (L) 36.0 - 46.0 %   MCV 76.9 (L) 78.0 - 100.0 fL   MCH 23.1 (L) 26.0 - 34.0 pg   MCHC 30.0 30.0 - 36.0 g/dL   RDW 25.6 (H) 11.5 - 15.5 %   Platelets 417 (H) 150 - 400 K/uL   Neutrophils Relative % 71 43 - 77 %   Neutro Abs 7.2 1.7 - 7.7 K/uL   Lymphocytes Relative 22 12 - 46 %   Lymphs Abs 2.2 0.7 - 4.0 K/uL   Monocytes Relative 6 3 - 12 %   Monocytes Absolute 0.6 0.1 - 1.0 K/uL   Eosinophils Relative 1 0 - 5 %   Eosinophils Absolute 0.1 0.0 - 0.7 K/uL   Basophils Relative 0 0 - 1 %   Basophils Absolute 0.0 0.0 - 0.1 K/uL    MAU Course  Procedures None  MDM CBC today 1 mg IM Dilaudid given Discussed patient with Dr. Julien Girt. She agrees with plan for discharge at this time as patient is in stable condition. Rx for Percocet may be given. Patient should follow-up as scheduled this week in the office Patient is hemodynamically stable. Blood pressure is elevated, patient states that she did not take her Norvasc this morning, but did take her Losartan. She recently  had Norvasc added when BP was elevated prior to her surgery. She has been advised to take her medications as prescribed and monitor for symptoms of HTN or CVA. She denies headache, blurred vision or chest pain today.  Assessment and Plan  A: Abnormal uterine bleeding Dysmenorrhea Anemia CHTN  P: Discharge home Rx for Percocet given to patient Patient advised to take anti-hypertensive medications as prescribed and follow-up with PCP HTN and CVA warning signs discussed Bleeding precautions discussed Patient advised to follow-up with Dr. Julien Girt as scheduled this week Patient may return to  MAU as needed or if her condition were to change or worsen   Luvenia Redden, PA-C  01/15/2015, 9:48 AM

## 2015-01-15 NOTE — MAU Note (Signed)
Pt states here for eval of fever on/off, as well as having pain that percocet won't help. Passing clots size of two silver dollars together. Hx anemia. Last took one percocet around 0330 with no relief.

## 2015-01-15 NOTE — Discharge Instructions (Signed)
Abnormal Uterine Bleeding Abnormal uterine bleeding means bleeding from the vagina that is not your normal menstrual period. This can be:  Bleeding or spotting between periods.  Bleeding after sex (sexual intercourse).  Bleeding that is heavier or more than normal.  Periods that last longer than usual.  Bleeding after menopause. There are many problems that may cause this. Treatment will depend on the cause of the bleeding. Any kind of bleeding that is not normal should be reviewed by your doctor.  HOME CARE Watch your condition for any changes. These actions may lessen any discomfort you are having:  Do not use tampons or douches as told by your doctor.  Change your pads often. You should get regular pelvic exams and Pap tests. Keep all appointments for tests as told by your doctor. GET HELP IF:  You are bleeding for more than 1 week.  You feel dizzy at times. GET HELP RIGHT AWAY IF:   You pass out.  You have to change pads every 15 to 30 minutes.  You have belly pain.  You have a fever.  You become sweaty or weak.  You are passing large blood clots from the vagina.  You feel sick to your stomach (nauseous) and throw up (vomit). MAKE SURE YOU:  Understand these instructions.  Will watch your condition.  Will get help right away if you are not doing well or get worse. Document Released: 03/17/2009 Document Revised: 05/25/2013 Document Reviewed: 12/17/2012 [facility] Patient Information 2015 [place], [place]. This information is not intended to replace advice given to you by your health care provider. Make sure you discuss any questions you have with your health care provider.  Dysmenorrhea Dysmenorrhea is pain during a menstrual period. You will have pain in the lower belly (abdomen). The pain is caused by the tightening (contracting) of the muscles of the uterus. The pain can be minor or severe. Headache, feeling sick to your stomach (nausea), throwing up (vomiting),  or low back pain may occur with this condition. HOME CARE  Only take medicine as told by your doctor.  Place a heating pad or hot water bottle on your lower back or belly. Do not sleep with a heating pad.  Exercise may help lessen the pain.  Massage the lower back or belly.  Stop smoking.  Avoid alcohol and caffeine. GET HELP IF:   Your pain does not get better with medicine.  You have pain during sex.  Your pain gets worse while taking pain medicine.  Your period bleeding is heavier than normal.  You keep feeling sick to your stomach or keep throwing up. GET HELP RIGHT AWAY IF: You pass out (faint). Document Released: 08/16/2008 Document Revised: 05/25/2013 Document Reviewed: 11/05/2012 [facility] Patient Information 2015 [place], [place]. This information is not intended to replace advice given to you by your health care provider. Make sure you discuss any questions you have with your health care provider.

## 2015-01-18 ENCOUNTER — Other Ambulatory Visit: Payer: Self-pay | Admitting: Obstetrics and Gynecology

## 2015-03-10 ENCOUNTER — Emergency Department (INDEPENDENT_AMBULATORY_CARE_PROVIDER_SITE_OTHER)
Admission: EM | Admit: 2015-03-10 | Discharge: 2015-03-10 | Disposition: A | Discharge status: Home / Self Care | Payer: 59 | Source: Home / Self Care | Attending: Physician Assistant | Admitting: Physician Assistant

## 2015-03-10 ENCOUNTER — Encounter (HOSPITAL_COMMUNITY): Payer: Self-pay | Admitting: Emergency Medicine

## 2015-03-10 DIAGNOSIS — L02412 Cutaneous abscess of left axilla: Secondary | ICD-10-CM | POA: Diagnosis not present

## 2015-03-10 MED ORDER — DOXYCYCLINE HYCLATE 100 MG PO CAPS: 100.0000 mg | ORAL_CAPSULE | Freq: Two times a day (BID) | ORAL | Status: DC

## 2015-03-10 NOTE — ED Notes (Signed)
Reports abscess to left axilla onset 2 days Reports she tried to "pop" it yest; drained a little a small amount Denies fevers, chills A&O x4... No acute distress.

## 2015-03-10 NOTE — Discharge Instructions (Signed)
Warm compress on abscess 3-4x daily for 15-20 minutes. Keep area clean and dry.  Take antibiotic until completely finished.   Abscess An abscess is an infected area that contains a collection of pus and debris.It can occur in almost any part of the body. An abscess is also known as a furuncle or boil. CAUSES  An abscess occurs when tissue gets infected. This can occur from blockage of oil or sweat glands, infection of hair follicles, or a minor injury to the skin. As the body tries to fight the infection, pus collects in the area and creates pressure under the skin. This pressure causes pain. People with weakened immune systems have difficulty fighting infections and get certain abscesses more often.  SYMPTOMS Usually an abscess develops on the skin and becomes a painful mass that is red, warm, and tender. If the abscess forms under the skin, you may feel a moveable soft area under the skin. Some abscesses break open (rupture) on their own, but most will continue to get worse without care. The infection can spread deeper into the body and eventually into the bloodstream, causing you to feel ill.  DIAGNOSIS  Your caregiver will take your medical history and perform a physical exam. A sample of fluid may also be taken from the abscess to determine what is causing your infection. TREATMENT  Your caregiver may prescribe antibiotic medicines to fight the infection. However, taking antibiotics alone usually does not cure an abscess. Your caregiver may need to make a small cut (incision) in the abscess to drain the pus. In some cases, gauze is packed into the abscess to reduce pain and to continue draining the area. HOME CARE INSTRUCTIONS   Only take over-the-counter or prescription medicines for pain, discomfort, or fever as directed by your caregiver.  If you were prescribed antibiotics, take them as directed. Finish them even if you start to feel better.  If gauze is used, follow your caregiver's  directions for changing the gauze.  To avoid spreading the infection:  Keep your draining abscess covered with a bandage.  Wash your hands well.  Do not share personal care items, towels, or whirlpools with others.  Avoid skin contact with others.  Keep your skin and clothes clean around the abscess.  Keep all follow-up appointments as directed by your caregiver. SEEK MEDICAL CARE IF:   You have increased pain, swelling, redness, fluid drainage, or bleeding.  You have muscle aches, chills, or a general ill feeling.  You have a fever. MAKE SURE YOU:   Understand these instructions.  Will watch your condition.  Will get help right away if you are not doing well or get worse.   This information is not intended to replace advice given to you by your health care provider. Make sure you discuss any questions you have with your health care provider.   Document Released: 02/27/2005 Document Revised: 11/19/2011 Document Reviewed: 08/02/2011 Elsevier Interactive Patient Education [employer].

## 2015-03-10 NOTE — ED Provider Notes (Signed)
CSN: [id number]     Arrival date & time 03/10/15  1530 History  nitiated Contact with Patient 03/10/15 1652     Chief Complaint  Patient presents with  . Abscess   HPI Patient presents for abscess of left axilla that was first noticed 2 nights ago. Became more painful so her and her husband expressed "a lot of pus" from abscess last night and began to feel better, however, wanted to make sure they got everything out. Did not stick any items into axilla; pus expressed from opening. Denies redness or axilla or fever/chills. Shaves regularly. Has never had an abscess before.   Past Medical History  Diagnosis Date  . Diabetes mellitus without complication ([place])   . Hypertension   . Diverticulitis 08/2013  . Anemia    Past Surgical History  Procedure Laterality Date  . Breast surgery      reconstruction  . Myomectomy    . Laparotomy Right 01/02/2015    Procedure: EXPLORATORY LAPAROTOMY WITH RIGHT OVARIAN CYSTECTOMY;  Surgeon: Marylynn Pearson, MD;  Location: [facility];  Service: Gynecology;  Laterality: Right;  . Lysis of adhesion N/A 01/02/2015    Procedure: LYSIS OF ADHESION;  Surgeon: Marylynn Pearson, MD;  Location: [facility];  Service: Gynecology;  Laterality: N/A;   Family History  Problem Relation Age of Onset  . Hypertension    . Diabetes    . Breast cancer    . Ovarian cancer     Social History  Substance Use Topics  . Smoking status: Never Smoker   . Smokeless tobacco: Never Used  . Alcohol Use: No   OB History    Gravida Para Term Preterm AB TAB SAB Ectopic Multiple Living   0 0 0 0 0 0 0 0 0 0      Review of Systems As noted above. Allergies  Codeine  Home Medications   Prior to Admission medications   Medication Sig Start Date End Date Taking? Authorizing Provider  losartan-hydrochlorothiazide (HYZAAR) 100-25 MG per tablet Take 1 tablet by mouth daily.   Yes Historical Provider, MD  metFORMIN (GLUCOPHAGE) 1000 MG tablet Take 1,000 mg by mouth daily after supper.    Yes  Historical Provider, MD  amLODipine (NORVASC) 5 MG tablet Take 5 mg by mouth daily.    Historical Provider, MD  doxycycline (VIBRAMYCIN) 100 MG capsule Take 1 capsule (100 mg total) by mouth 2 (two) times daily. 03/10/15   Rhyen Mazariego R Santana Gosdin, PA-C  ferrous sulfate 325 (65 FE) MG tablet Take 1 tablet (325 mg total) by mouth 2 (two) times daily with a meal. 10/17/14   Artist Pais Rasch, NP  ibuprofen (ADVIL,MOTRIN) 600 MG tablet Take 1 tablet (600 mg total) by mouth every 6 (six) hours as needed for mild pain. 01/04/15   Marylynn Pearson, MD  medroxyPROGESTERone (PROVERA) 10 MG tablet Take 1 tablet (10 mg total) by mouth daily. 01/04/15   Marylynn Pearson, MD  oxyCODONE-acetaminophen (PERCOCET/ROXICET) 5-325 MG per tablet Take 1-2 tablets by mouth every 4 (four) hours as needed for moderate pain. 01/15/15   Luvenia Redden, PA-C   Meds Ordered and Administered this Visit  Medications - No data to display  BP 124/87 mmHg  Pulse 81  Temp(Src) 98.2 F (36.8 C) (Oral)  Resp 16  SpO2 97% No data found.   Physical Exam  Constitutional: She is oriented to person, place, and time. She appears well-developed and well-nourished. No distress.  Blood pressure 124/87, pulse 81, temperature 98.2 F (36.8 C),  temperature source Oral, resp. rate 16, SpO2 97 %.   HENT:  Head: Normocephalic and atraumatic.  Right Ear: External ear normal.  Left Ear: External ear normal.  Eyes: Conjunctivae are normal. Right eye exhibits no discharge. Left eye exhibits no discharge. No scleral icterus.  Pulmonary/Chest: Effort normal.  Neurological: She is alert and oriented to person, place, and time.  Skin: Skin is warm and dry. No rash noted. She is not diaphoretic. No erythema.  Axillary abscess: marginal induration without erythema. Small centrally located opening. Only able to express droplet of purulence.   Psychiatric: She has a normal mood and affect. Her behavior is normal. Judgment and thought content normal.    ED  Course  Procedures (including critical care time)        MDM   1. Abscess of axilla, left    Attempted to express purulence. Should use warm compress 3-4x daily and keep area dry and clean.   Meds ordered this encounter  Medications  . doxycycline (VIBRAMYCIN) 100 MG capsule    Sig: Take 1 capsule (100 mg total) by mouth 2 (two) times daily.    Dispense:  20 capsule    Refill:  0    Order Specific Question:  Supervising Provider    Answer:  Sherlene Shams [[id number]]      [place], PA-C 03/10/15 1712

## 2015-03-13 ENCOUNTER — Other Ambulatory Visit: Payer: Self-pay | Admitting: Internal Medicine

## 2015-03-13 ENCOUNTER — Ambulatory Visit (INDEPENDENT_AMBULATORY_CARE_PROVIDER_SITE_OTHER): Payer: 59 | Admitting: Family Medicine

## 2015-03-13 ENCOUNTER — Encounter: Payer: Self-pay | Admitting: Family Medicine

## 2015-03-13 DIAGNOSIS — E119 Type 2 diabetes mellitus without complications: Secondary | ICD-10-CM

## 2015-03-13 DIAGNOSIS — I10 Essential (primary) hypertension: Secondary | ICD-10-CM

## 2015-03-13 DIAGNOSIS — Z0000 Encounter for general adult medical examination without abnormal findings: Secondary | ICD-10-CM

## 2015-03-13 DIAGNOSIS — E669 Obesity, unspecified: Secondary | ICD-10-CM | POA: Diagnosis not present

## 2015-03-13 DIAGNOSIS — E1169 Type 2 diabetes mellitus with other specified complication: Secondary | ICD-10-CM

## 2015-03-13 DIAGNOSIS — Z7189 Other specified counseling: Secondary | ICD-10-CM

## 2015-03-13 DIAGNOSIS — Z7689 Persons encountering health services in other specified circumstances: Secondary | ICD-10-CM

## 2015-03-13 MED ORDER — METFORMIN HCL 1000 MG PO TABS: 1000.0000 mg | ORAL_TABLET | Freq: Every day | ORAL | Status: DC

## 2015-03-13 NOTE — Progress Notes (Signed)
   Subjective:    Patient ID: Amy Banks, female    DOB: July 15, 1975, 39 y.o.   MRN: [id number]  HPI She is new to the practice and here to establish primary care. She was going to [place] for heavy bleeding, clots and iron deficiency. She had surgery and dermoid cyst removed and has fibroid that cannot be removed. Now has an IUD to help control bleeding. She is seeing an Reproductive Endocrinologist Dr. Posey Pronto at [facility] because she would like to get pregnant. Prolactin level was high, taking unknown medication for this. She will possibly do IVF but was told she has to lose weight, approximately 39 lbs before attempting IVF. States she has history of PCOS (2007). Has been taking Metformin since then and states it still gives her loose bowels so she takes it at night. She states she was diagnosed with DM 2-3 years ago.  Denies complaints today. Denies fever, chills, fatigue, DOE, GI or GU issues.   She is exercising a few days per week.   She is married. Does not smoke, drink alcohol, or use drugs.  Reviewed medications, allergies, past medical, surgical, family and social history.    Review of Systems Pertinent positives and negatives in HPI.     Objective:   Physical Exam  Alert and oriented and in no distress. Not otherwise examined.      Assessment & Plan:  Morbid obesity, unspecified obesity type ([place]) - Plan: Amb ref to Medical Nutrition Therapy-MNT  Encounter to establish care  Diabetes mellitus type 2 in obese ([place]) - Plan: Amb ref to Medical Nutrition Therapy-MNT  Essential hypertension - Plan: Amb ref to Medical Nutrition Therapy-MNT  Discussed in depth options for weight loss including MyFitnessPal app and exercise. Discussed that this is a lifestyle change and the healthy way to lose weight. Will refer her to nutritionist as well. Discussed possibility of weight loss medication or surgery and she is not interested in these options at present.  States she has always been heavy and does not want surgery. She will return for fasting labs and physical exam. Discussed current blood pressure medication is not safe for pregnancy and the need to change prior to her getting her IUD removed. Also recommended that she consult with her OB regarding changing blood pressure medication. Also discussed that there are multiple ways to have a family including adoption.   Discussed carb and calorie counting for diabetes as well. She has been stable on Metformin once daily, no change made today. Will follow up pending labs.  Spent at least 45 minutes and at least half of that time was counseling patient.

## 2015-03-13 NOTE — Patient Instructions (Signed)

## 2015-04-04 ENCOUNTER — Encounter: Payer: 59 | Admitting: Family Medicine

## 2015-04-07 ENCOUNTER — Encounter: Payer: Self-pay | Admitting: Family Medicine

## 2015-04-07 ENCOUNTER — Other Ambulatory Visit: Payer: Self-pay | Admitting: Obstetrics and Gynecology

## 2015-04-07 DIAGNOSIS — D352 Benign neoplasm of pituitary gland: Secondary | ICD-10-CM

## 2015-05-11 ENCOUNTER — Encounter: Payer: Self-pay | Admitting: Family Medicine

## 2015-05-11 DIAGNOSIS — Z8659 Personal history of other mental and behavioral disorders: Secondary | ICD-10-CM | POA: Insufficient documentation

## 2015-05-11 DIAGNOSIS — I10 Essential (primary) hypertension: Secondary | ICD-10-CM | POA: Insufficient documentation

## 2015-05-18 ENCOUNTER — Ambulatory Visit
Admission: RE | Admit: 2015-05-18 | Discharge: 2015-05-18 | Disposition: A | Discharge status: Home / Self Care | Payer: 59 | Source: Ambulatory Visit | Attending: Obstetrics and Gynecology | Admitting: Obstetrics and Gynecology

## 2015-05-18 DIAGNOSIS — D352 Benign neoplasm of pituitary gland: Secondary | ICD-10-CM

## 2015-05-18 MED ORDER — GADOBENATE DIMEGLUMINE 529 MG/ML IV SOLN
10.0000 mL | Freq: Once | INTRAVENOUS | Status: AC | PRN
  Administered 2015-05-18: 10 mL via INTRAVENOUS

## 2015-08-09 MED FILL — LOSARTAN-HCTZ 100-25 MG TAB: 100-25 | 60 days supply | Qty: 60 | Fill #1

## 2015-08-15 MED FILL — ACETAMINOPHEN/COD #3 TABLET: 300-30 | 2 days supply | Qty: 10 | Fill #0

## 2015-09-28 ENCOUNTER — Encounter: Payer: Self-pay | Admitting: Family Medicine

## 2015-09-28 ENCOUNTER — Ambulatory Visit (INDEPENDENT_AMBULATORY_CARE_PROVIDER_SITE_OTHER): Payer: 59 | Admitting: Family Medicine

## 2015-09-28 VITALS — BP 190/96 | HR 76 | Resp 18 | Ht 66.0 in | Wt 323.8 lb

## 2015-09-28 DIAGNOSIS — Z86018 Personal history of other benign neoplasm: Secondary | ICD-10-CM | POA: Insufficient documentation

## 2015-09-28 DIAGNOSIS — R519 Headache, unspecified: Secondary | ICD-10-CM

## 2015-09-28 DIAGNOSIS — R51 Headache: Secondary | ICD-10-CM

## 2015-09-28 DIAGNOSIS — I10 Essential (primary) hypertension: Secondary | ICD-10-CM

## 2015-09-28 MED ORDER — LABETALOL HCL 100 MG PO TABS: 100.0000 mg | ORAL_TABLET | Freq: Two times a day (BID) | ORAL | Status: DC

## 2015-09-28 MED FILL — LABETALOL HCL 100 MG TABLET: 100 | 30 days supply | Qty: 60 | Fill #0

## 2015-09-28 NOTE — Patient Instructions (Addendum)
I am starting you on a new blood pressure medication today that is safe in pregnancy.  Make sure you're eating a low-salt diet and drinking plenty of water. Watch your caffeine intake. If your headaches become more persistent, let me know. Follow up next week for blood pressure check and diabetes check.   DASH Eating Plan DASH stands for "Dietary Approaches to Stop Hypertension." The DASH eating plan is a healthy eating plan that has been shown to reduce high blood pressure (hypertension). Additional health benefits may include reducing the risk of type 2 diabetes mellitus, heart disease, and stroke. The DASH eating plan may also help with weight loss. WHAT DO I NEED TO KNOW ABOUT THE DASH EATING PLAN? For the DASH eating plan, you will follow these general guidelines:  Choose foods with a percent daily value for sodium of less than 5% (as listed on the food label).  Use salt-free seasonings or herbs instead of table salt or sea salt.  Check with your health care provider or pharmacist before using salt substitutes.  Eat lower-sodium products, often labeled as "lower sodium" or "no salt added."  Eat fresh foods.  Eat more vegetables, fruits, and low-fat dairy products.  Choose whole grains. Look for the word "whole" as the first word in the ingredient list.  Choose fish and skinless chicken or [place] more often than red meat. Limit fish, poultry, and meat to 6 oz (170 g) each day.  Limit sweets, desserts, sugars, and sugary drinks.  Choose heart-healthy fats.  Limit cheese to 1 oz (28 g) per day.  Eat more home-cooked food and less restaurant, buffet, and fast food.  Limit fried foods.  Cook foods using methods other than frying.  Limit canned vegetables. If you do use them, rinse them well to decrease the sodium.  When eating at a restaurant, ask that your food be prepared with less salt, or no salt if possible. WHAT FOODS CAN I EAT? Seek help from a dietitian for  individual calorie needs. Grains Whole grain or whole wheat bread. Brown rice. Whole grain or whole wheat pasta. Quinoa, bulgur, and whole grain cereals. Low-sodium cereals. Corn or whole wheat flour tortillas. Whole grain cornbread. Whole grain crackers. Low-sodium crackers. Vegetables Fresh or frozen vegetables (raw, steamed, roasted, or grilled). Low-sodium or reduced-sodium tomato and vegetable juices. Low-sodium or reduced-sodium tomato sauce and paste. Low-sodium or reduced-sodium canned vegetables.  Fruits All fresh, canned (in natural juice), or frozen fruits. Meat and Other Protein Products Ground beef (85% or leaner), grass-fed beef, or beef trimmed of fat. Skinless chicken or [place]. Ground chicken or [place]. Pork trimmed of fat. All fish and seafood. Eggs. Dried beans, peas, or lentils. Unsalted nuts and seeds. Unsalted canned beans. Dairy Low-fat dairy products, such as skim or 1% milk, 2% or reduced-fat cheeses, low-fat ricotta or cottage cheese, or plain low-fat yogurt. Low-sodium or reduced-sodium cheeses. Fats and Oils Tub margarines without trans fats. Light or reduced-fat mayonnaise and salad dressings (reduced sodium). Avocado. Safflower, olive, or canola oils. Natural peanut or almond butter. Other Unsalted popcorn and pretzels. The items listed above may not be a complete list of recommended foods or beverages. Contact your dietitian for more options. WHAT FOODS ARE NOT RECOMMENDED? Grains Shorty bread. Fales pasta. Mireles rice. Refined cornbread. Bagels and croissants. Crackers that contain trans fat. Vegetables Creamed or fried vegetables. Vegetables in a cheese sauce. Regular canned vegetables. Regular canned tomato sauce and paste. Regular tomato and vegetable juices. Fruits Dried fruits. Canned fruit  in light or heavy syrup. Fruit juice. Meat and Other Protein Products Fatty cuts of meat. Ribs, chicken wings, bacon, sausage, bologna, salami, chitterlings, fatback, hot  dogs, bratwurst, and packaged luncheon meats. Salted nuts and seeds. Canned beans with salt. Dairy Whole or 2% milk, cream, half-and-half, and cream cheese. Whole-fat or sweetened yogurt. Full-fat cheeses or blue cheese. Nondairy creamers and whipped toppings. Processed cheese, cheese spreads, or cheese curds. Condiments Onion and garlic salt, seasoned salt, table salt, and sea salt. Canned and packaged gravies. Worcestershire sauce. Tartar sauce. Barbecue sauce. Teriyaki sauce. Soy sauce, including reduced sodium. Steak sauce. Fish sauce. Oyster sauce. Cocktail sauce. Horseradish. Ketchup and mustard. Meat flavorings and tenderizers. Bouillon cubes. Hot sauce. Tabasco sauce. Marinades. Taco seasonings. Relishes. Fats and Oils Butter, stick margarine, lard, shortening, ghee, and bacon fat. Coconut, palm kernel, or palm oils. Regular salad dressings. Other Pickles and olives. Salted popcorn and pretzels. The items listed above may not be a complete list of foods and beverages to avoid. Contact your dietitian for more information. WHERE CAN I FIND MORE INFORMATION? National Heart, Lung, and Blood Institute: travelstabloid.com   This information is not intended to replace advice given to you by your health care provider. Make sure you discuss any questions you have with your health care provider.   Document Released: 05/09/2011 Document Revised: 06/10/2014 Document Reviewed: 03/24/2013 Elsevier Interactive Patient Education [employer].

## 2015-09-28 NOTE — Progress Notes (Signed)
Subjective:    Patient ID: Amy Banks, female    DOB: 07/03/1975, 40 y.o.   MRN: [id number]  HPI Chief Complaint  Patient presents with  . Headache    started Monday and has been off and on. Took some advil last night and this morning-helps some but when wears off headache comes right back. Dull headache. No light sensitivity and no nausea. Monday and Tues she was really tired. Thinks it may be hormone related.    She is here with complaints of intermittent headache since for past 4 days. States pain is dull and behind right eye. Lasts 3-5 minutes only.   Advil improves headache. States some days she does not have a headache at all and some days it happens 2-3 times. States it occurred 2 times today. Denies headache at present. States the headache is not preceded by any warning symptoms. No associated photophobia, phonophobia or nausea.  She does admit to drinking extra caffeine and not much water over past week or so.  She also states she stopped taking her blood pressure medication approximately 4-6 weeks ago because she is trying to get pregnant and her IUD fell out about the same time. She is aware that her medication is not safe to take for pregnancy.  She states her period is 4 days late and she had a negative pregnancy test yesterday.   She recalls having some nasal congestion and bilateral ear pressure that was present a couple of days prior to headache onset. Denies fever, chills, dizziness, visual disturbances, ear pain, sore throat, cough, chest pain, palpitations, DOE, abdominal pain, LE edema, GI or GU symptoms.    [facility] Also [facility]osey Pronto at [facility] due to fertility issues IUD fell out 3 weeks ago    Review of Systems Pertinent positives and negatives in the history of present illness.     Objective:   Physical Exam  Constitutional: She is oriented to person, place, and time. She appears well-developed and well-nourished. No distress.  HENT:    Right Ear: Tympanic membrane and ear canal normal.  Left Ear: Tympanic membrane and ear canal normal.  Mouth/Throat: Uvula is midline, oropharynx is clear and moist and mucous membranes are normal.  Neck: Normal range of motion and full passive range of motion without pain. Neck supple. No JVD present.  Cardiovascular: Normal rate, regular rhythm, S1 normal, S2 normal, normal heart sounds and intact distal pulses.  Exam reveals no gallop and no friction rub.   No murmur heard. Pulmonary/Chest: Effort normal and breath sounds normal.  Neurological: She is alert and oriented to person, place, and time. She has normal reflexes.  Skin: Skin is warm and dry. No pallor.  Psychiatric: She has a normal mood and affect. Her speech is normal and behavior is normal. Thought content normal.   BP 190/96 mmHg  Pulse 76  Resp 18  Ht 5\' 6"  (1.676 m)  Wt 323 lb 12.8 oz (146.875 kg)  BMI 52.29 kg/m2  LMP 08/23/2015      Assessment & Plan:  Headache, unspecified headache type  Essential hypertension - Plan: labetalol (NORMODYNE) 100 MG tablet  She declines pregnancy test today. Would like to switch HTN medication to one that is safe for pregnancy. Stop Hyzaar and start Labetalol.  Discussed importance of compliance with medication. Discussed eating a low salt diet and DASH diet provided. Recommend that she start checking her blood pressure at home and pulse and report back her readings to  assess medication efficacy.  Discussed that headaches being intermittent and brief in duration speaks to this not being anything serious and most likely not related to blood pressure. Discussed possible relationship between headaches and  increase in caffeine and dehydration. Exam unremarkable.  Recommend she keep a headache diary.  Will follow up next week for headaches, HTN and Diabetes.

## 2015-11-05 DIAGNOSIS — H5213 Myopia, bilateral: Secondary | ICD-10-CM | POA: Diagnosis not present

## 2016-03-03 DIAGNOSIS — Z9289 Personal history of other medical treatment: Secondary | ICD-10-CM

## 2016-03-03 HISTORY — DX: Personal history of other medical treatment: Z92.89

## 2016-03-26 ENCOUNTER — Encounter: Payer: Self-pay | Admitting: Family Medicine

## 2016-03-26 ENCOUNTER — Ambulatory Visit (INDEPENDENT_AMBULATORY_CARE_PROVIDER_SITE_OTHER): Payer: 59 | Admitting: Family Medicine

## 2016-03-26 VITALS — BP 130/80 | HR 80 | Temp 98.0°F | Wt 316.0 lb

## 2016-03-26 DIAGNOSIS — B9789 Other viral agents as the cause of diseases classified elsewhere: Secondary | ICD-10-CM | POA: Diagnosis not present

## 2016-03-26 DIAGNOSIS — J069 Acute upper respiratory infection, unspecified: Secondary | ICD-10-CM | POA: Diagnosis not present

## 2016-03-26 NOTE — Progress Notes (Signed)
   Subjective:    Patient ID: Amy Banks, female    DOB: Sep 01, 1975, 40 y.o.   MRN: [id number]  HPI She complains of a three-day history of started with sore throat followed by rhinorrhea, slight cough but no earache, fever, chills. Sore throat is gone away and she still has a dry cough. She did note some slight drainage from the right eye. She has been using NyQuil.   Review of Systems     Objective:   Physical Exam Alert and in no distress. Tympanic membranes and canals are normal. Pharyngeal area is normal. Neck is supple without adenopathy or thyromegaly. Cardiac exam shows a regular sinus rhythm without murmurs or gallops. Lungs are clear to auscultation. Conjunctiva appears normal.       Assessment & Plan:  Viral URI with cough Recommend supportive care. Explained that she should be better in 7-10 days and if any difficulty, call.

## 2016-03-28 DIAGNOSIS — D259 Leiomyoma of uterus, unspecified: Secondary | ICD-10-CM | POA: Diagnosis not present

## 2016-03-28 DIAGNOSIS — Z7984 Long term (current) use of oral hypoglycemic drugs: Secondary | ICD-10-CM | POA: Diagnosis not present

## 2016-03-28 DIAGNOSIS — D500 Iron deficiency anemia secondary to blood loss (chronic): Secondary | ICD-10-CM | POA: Diagnosis not present

## 2016-03-28 DIAGNOSIS — R4189 Other symptoms and signs involving cognitive functions and awareness: Secondary | ICD-10-CM | POA: Diagnosis not present

## 2016-03-28 DIAGNOSIS — Z9114 Patient's other noncompliance with medication regimen: Secondary | ICD-10-CM | POA: Diagnosis not present

## 2016-03-28 DIAGNOSIS — N939 Abnormal uterine and vaginal bleeding, unspecified: Secondary | ICD-10-CM | POA: Diagnosis not present

## 2016-03-28 DIAGNOSIS — I10 Essential (primary) hypertension: Secondary | ICD-10-CM | POA: Diagnosis not present

## 2016-03-29 DIAGNOSIS — Z7984 Long term (current) use of oral hypoglycemic drugs: Secondary | ICD-10-CM | POA: Diagnosis not present

## 2016-03-29 DIAGNOSIS — D500 Iron deficiency anemia secondary to blood loss (chronic): Secondary | ICD-10-CM | POA: Diagnosis not present

## 2016-03-29 DIAGNOSIS — N939 Abnormal uterine and vaginal bleeding, unspecified: Secondary | ICD-10-CM | POA: Diagnosis not present

## 2016-03-29 DIAGNOSIS — D259 Leiomyoma of uterus, unspecified: Secondary | ICD-10-CM | POA: Diagnosis not present

## 2016-03-29 DIAGNOSIS — Z9114 Patient's other noncompliance with medication regimen: Secondary | ICD-10-CM | POA: Diagnosis not present

## 2016-03-29 DIAGNOSIS — I10 Essential (primary) hypertension: Secondary | ICD-10-CM | POA: Diagnosis not present

## 2016-03-29 MED FILL — LOSARTAN-HCTZ 100-12.5 MG T: 100-12.5 | 30 days supply | Qty: 30 | Fill #0

## 2016-03-29 MED FILL — NORETHINDRONE 5 MG TABLET: 5 | 90 days supply | Qty: 90 | Fill #0

## 2016-03-31 DIAGNOSIS — D500 Iron deficiency anemia secondary to blood loss (chronic): Secondary | ICD-10-CM | POA: Diagnosis not present

## 2016-04-19 DIAGNOSIS — D500 Iron deficiency anemia secondary to blood loss (chronic): Secondary | ICD-10-CM | POA: Diagnosis not present

## 2016-04-22 MED FILL — LOSARTAN-HCTZ 100-12.5 MG T: 100-12.5 | 30 days supply | Qty: 30 | Fill #1

## 2016-06-05 MED FILL — LOSARTAN-HCTZ 100-12.5 MG T: 100-12.5 | 30 days supply | Qty: 30 | Fill #0

## 2016-06-25 ENCOUNTER — Encounter: Payer: Self-pay | Admitting: Family Medicine

## 2016-06-25 DIAGNOSIS — E1165 Type 2 diabetes mellitus with hyperglycemia: Secondary | ICD-10-CM

## 2016-06-25 DIAGNOSIS — IMO0001 Reserved for inherently not codable concepts without codable children: Secondary | ICD-10-CM | POA: Insufficient documentation

## 2016-06-26 ENCOUNTER — Ambulatory Visit (INDEPENDENT_AMBULATORY_CARE_PROVIDER_SITE_OTHER): Payer: 59 | Admitting: Family Medicine

## 2016-06-26 ENCOUNTER — Encounter: Payer: Self-pay | Admitting: Family Medicine

## 2016-06-26 VITALS — BP 120/82 | HR 68 | Wt 327.2 lb

## 2016-06-26 DIAGNOSIS — I10 Essential (primary) hypertension: Secondary | ICD-10-CM

## 2016-06-26 DIAGNOSIS — M7989 Other specified soft tissue disorders: Secondary | ICD-10-CM

## 2016-06-26 NOTE — Progress Notes (Signed)
   Subjective:    Patient ID: Amy Banks, female    DOB: 1976/01/04, 41 y.o.   MRN: [id number]  HPI Chief Complaint  Patient presents with  . blood pressure    blood pressure- on med    She is here for a follow up on BP. States she has been doing well. States she had an issue with her menstrual cycles and is now on medication to keep her from having them for at least 6 months. This is being managed by her OB/GYN at [place] she had to have a blood transfusion in October 2017.  Is taking iron daily and a prenatal vitamin.   She is now on Hyzaar for BP. She was taking a beta blocker because she desired pregnancy. Her medication was changed by her OB/GYN due to uncontrolled BP.   She also complains of a bump under her right breast for the past 3 days. Denies redness, tenderness, drainage. Denies history of abscess.   Denies fever, chills, fatigue, dizziness, chest pain, palpitations, shortness of breath, abdominal pain, GI or GU symptoms.   Reviewed allergies, medications, past medical, and social history.    Review of Systems Pertinent positives and negatives in the history of present illness.     Objective:   Physical Exam  Constitutional: She appears well-developed and well-nourished. No distress.  Cardiovascular: Normal rate, regular rhythm and normal heart sounds.   Pulmonary/Chest: Effort normal and breath sounds normal.  Skin: Skin is warm and dry.  Smooth round moveable superficial cyst beneath right breast, no erythema, edema, induration or drainage.    BP 120/82   Pulse 68   Wt (!) 327 lb 3.2 oz (148.4 kg)   BMI 52.81 kg/m      Assessment & Plan:  Essential hypertension  Cyst of soft tissue  Discussed that her BP is now in goal range. Continue on current medication. DASH diet instructions provided.  Discussed that she appears to have a benign cyst under her right breast and recommend watchful waiting.  Plan to have her return in 2-3 months for a  CPE and fasting labs.

## 2016-06-26 NOTE — Patient Instructions (Signed)
Continue on your blood pressure medication and take a look at the [facility] eating plan for high blood pressure.   DASH Eating Plan DASH stands for "Dietary Approaches to Stop Hypertension." The DASH eating plan is a healthy eating plan that has been shown to reduce high blood pressure (hypertension). Additional health benefits may include reducing the risk of type 2 diabetes mellitus, heart disease, and stroke. The DASH eating plan may also help with weight loss. What do I need to know about the DASH eating plan? For the DASH eating plan, you will follow these general guidelines:  Choose foods with less than 150 milligrams of sodium per serving (as listed on the food label).  Use salt-free seasonings or herbs instead of table salt or sea salt.  Check with your health care provider or pharmacist before using salt substitutes.  Eat lower-sodium products. These are often labeled as "low-sodium" or "no salt added."  Eat fresh foods. Avoid eating a lot of canned foods.  Eat more vegetables, fruits, and low-fat dairy products.  Choose whole grains. Look for the word "whole" as the first word in the ingredient list.  Choose fish and skinless chicken or [place] more often than red meat. Limit fish, poultry, and meat to 6 oz (170 g) each day.  Limit sweets, desserts, sugars, and sugary drinks.  Choose heart-healthy fats.  Eat more home-cooked food and less restaurant, buffet, and fast food.  Limit fried foods.  Do not fry foods. Cook foods using methods such as baking, boiling, grilling, and broiling instead.  When eating at a restaurant, ask that your food be prepared with less salt, or no salt if possible. What foods can I eat? Seek help from a dietitian for individual calorie needs. Grains  Whole grain or whole wheat bread. Brown rice. Whole grain or whole wheat pasta. Quinoa, bulgur, and whole grain cereals. Low-sodium cereals. Corn or whole wheat flour tortillas. Whole grain cornbread.  Whole grain crackers. Low-sodium crackers. Vegetables  Fresh or frozen vegetables (raw, steamed, roasted, or grilled). Low-sodium or reduced-sodium tomato and vegetable juices. Low-sodium or reduced-sodium tomato sauce and paste. Low-sodium or reduced-sodium canned vegetables. Fruits  All fresh, canned (in natural juice), or frozen fruits. Meat and Other Protein Products  Ground beef (85% or leaner), grass-fed beef, or beef trimmed of fat. Skinless chicken or [place]. Ground chicken or [place]. Pork trimmed of fat. All fish and seafood. Eggs. Dried beans, peas, or lentils. Unsalted nuts and seeds. Unsalted canned beans. Dairy  Low-fat dairy products, such as skim or 1% milk, 2% or reduced-fat cheeses, low-fat ricotta or cottage cheese, or plain low-fat yogurt. Low-sodium or reduced-sodium cheeses. Fats and Oils  Tub margarines without trans fats. Light or reduced-fat mayonnaise and salad dressings (reduced sodium). Avocado. Safflower, olive, or canola oils. Natural peanut or almond butter. Other  Unsalted popcorn and pretzels. The items listed above may not be a complete list of recommended foods or beverages. Contact your dietitian for more options.  What foods are not recommended? Grains  Ostlund bread. Herdt pasta. Gainey rice. Refined cornbread. Bagels and croissants. Crackers that contain trans fat. Vegetables  Creamed or fried vegetables. Vegetables in a cheese sauce. Regular canned vegetables. Regular canned tomato sauce and paste. Regular tomato and vegetable juices. Fruits  Canned fruit in light or heavy syrup. Fruit juice. Meat and Other Protein Products  Fatty cuts of meat. Ribs, chicken wings, bacon, sausage, bologna, salami, chitterlings, fatback, hot dogs, bratwurst, and packaged luncheon meats. Salted nuts and seeds.  Canned beans with salt. Dairy  Whole or 2% milk, cream, half-and-half, and cream cheese. Whole-fat or sweetened yogurt. Full-fat cheeses or blue cheese. Nondairy  creamers and whipped toppings. Processed cheese, cheese spreads, or cheese curds. Condiments  Onion and garlic salt, seasoned salt, table salt, and sea salt. Canned and packaged gravies. Worcestershire sauce. Tartar sauce. Barbecue sauce. Teriyaki sauce. Soy sauce, including reduced sodium. Steak sauce. Fish sauce. Oyster sauce. Cocktail sauce. Horseradish. Ketchup and mustard. Meat flavorings and tenderizers. Bouillon cubes. Hot sauce. Tabasco sauce. Marinades. Taco seasonings. Relishes. Fats and Oils  Butter, stick margarine, lard, shortening, ghee, and bacon fat. Coconut, palm kernel, or palm oils. Regular salad dressings. Other  Pickles and olives. Salted popcorn and pretzels. The items listed above may not be a complete list of foods and beverages to avoid. Contact your dietitian for more information.  Where can I find more information? National Heart, Lung, and Blood Institute: travelstabloid.com This information is not intended to replace advice given to you by your health care provider. Make sure you discuss any questions you have with your health care provider. Document Released: 05/09/2011 Document Revised: 10/26/2015 Document Reviewed: 03/24/2013 Elsevier Interactive Patient Education  2017 [employer].

## 2016-07-01 MED FILL — NORETHINDRONE 5 MG TABLET: 5 | 90 days supply | Qty: 90 | Fill #1

## 2016-07-04 ENCOUNTER — Telehealth: Payer: Self-pay | Admitting: Family Medicine

## 2016-07-04 MED ORDER — LOSARTAN POTASSIUM-HCTZ 100-12.5 MG PO TABS: 1.0000 | ORAL_TABLET | Freq: Every day | ORAL | 5 refills | Status: DC

## 2016-07-04 MED FILL — LOSARTAN-HCTZ 100-12.5 MG T: 100-12.5 | 30 days supply | Qty: 30 | Fill #0

## 2016-07-04 NOTE — Telephone Encounter (Signed)
Please check on this.

## 2016-07-04 NOTE — Telephone Encounter (Signed)
Pt went the [facility] BP medicine still not there, please refill

## 2016-07-04 NOTE — Telephone Encounter (Signed)
done

## 2016-08-16 MED FILL — LOSARTAN-HCTZ 100-12.5 MG T: 100-12.5 | 30 days supply | Qty: 30 | Fill #1

## 2016-08-18 DIAGNOSIS — H5213 Myopia, bilateral: Secondary | ICD-10-CM | POA: Diagnosis not present

## 2016-09-06 ENCOUNTER — Emergency Department (HOSPITAL_COMMUNITY): Payer: 59

## 2016-09-06 ENCOUNTER — Emergency Department (HOSPITAL_COMMUNITY)
Admission: EM | Admit: 2016-09-06 | Discharge: 2016-09-06 | Disposition: A | Discharge status: Home / Self Care | Payer: 59 | Attending: Emergency Medicine | Admitting: Emergency Medicine

## 2016-09-06 ENCOUNTER — Encounter (HOSPITAL_COMMUNITY): Payer: Self-pay | Admitting: Emergency Medicine

## 2016-09-06 DIAGNOSIS — R079 Chest pain, unspecified: Secondary | ICD-10-CM | POA: Diagnosis not present

## 2016-09-06 DIAGNOSIS — E119 Type 2 diabetes mellitus without complications: Secondary | ICD-10-CM | POA: Diagnosis not present

## 2016-09-06 DIAGNOSIS — R0789 Other chest pain: Secondary | ICD-10-CM | POA: Diagnosis not present

## 2016-09-06 DIAGNOSIS — I10 Essential (primary) hypertension: Secondary | ICD-10-CM | POA: Insufficient documentation

## 2016-09-06 LAB — BASIC METABOLIC PANEL
ANION GAP: 13 (ref 5–15)
BUN: 5 mg/dL — ABNORMAL LOW (ref 6–20)
CALCIUM: 9.2 mg/dL (ref 8.9–10.3)
CO2: 24 mmol/L (ref 22–32)
Chloride: 101 mmol/L (ref 101–111)
Creatinine, Ser: 0.78 mg/dL (ref 0.44–1.00)
GLUCOSE: 150 mg/dL — AB (ref 65–99)
POTASSIUM: 3.6 mmol/L (ref 3.5–5.1)
SODIUM: 138 mmol/L (ref 135–145)

## 2016-09-06 LAB — I-STAT TROPONIN, ED
TROPONIN I, POC: 0 ng/mL (ref 0.00–0.08)
TROPONIN I, POC: 0.02 ng/mL (ref 0.00–0.08)

## 2016-09-06 LAB — CBC
HEMATOCRIT: 39.7 % (ref 36.0–46.0)
HEMOGLOBIN: 12.6 g/dL (ref 12.0–15.0)
MCH: 27.1 pg (ref 26.0–34.0)
MCHC: 31.7 g/dL (ref 30.0–36.0)
MCV: 85.4 fL (ref 78.0–100.0)
Platelets: 342 10*3/uL (ref 150–400)
RBC: 4.65 MIL/uL (ref 3.87–5.11)
RDW: 14.1 % (ref 11.5–15.5)
WBC: 9.9 10*3/uL (ref 4.0–10.5)

## 2016-09-06 MED ORDER — GI COCKTAIL ~~LOC~~
30.0000 mL | Freq: Once | ORAL | Status: AC
  Administered 2016-09-06: 30 mL via ORAL
  Filled 2016-09-06: qty 30

## 2016-09-06 MED ORDER — PANTOPRAZOLE SODIUM 20 MG PO TBEC: 20.0000 mg | DELAYED_RELEASE_TABLET | Freq: Every day | ORAL | 0 refills | Status: DC

## 2016-09-06 MED ORDER — FAMOTIDINE IN NACL 20-0.9 MG/50ML-% IV SOLN
20.0000 mg | Freq: Once | INTRAVENOUS | Status: AC
Start: 2016-09-06 — End: 2016-09-06
  Administered 2016-09-06: 20 mg via INTRAVENOUS
  Filled 2016-09-06: qty 50

## 2016-09-06 MED FILL — PANTOPRAZOLE SOD DR 20 MG T: 20 | 60 days supply | Qty: 60 | Fill #0

## 2016-09-06 NOTE — ED Triage Notes (Signed)
Pt reports chest pains since 6PM yesterday, states dull and tight. Reports waking up this morning progressing into more severe pain and headache behind L eye. States she took an aleve just prior to arrival.

## 2016-09-06 NOTE — ED Notes (Signed)
Dr. Betsey Holiday explained tests results and discharge plan to pt.

## 2016-09-06 NOTE — ED Notes (Signed)
Patient transported to X-ray 

## 2016-09-06 NOTE — ED Provider Notes (Signed)
[facility] DEPT Provider Note   CSN: [id number] Arrival date & time: 09/06/16  0350     History   Chief Complaint Chief Complaint  Patient presents with  . Chest Pain    HPI Amy Banks is a 41 y.o. female.  Patient presents to the emergency department for evaluation of chest pain. Patient reports the symptoms began yesterday evening. She started having a intermittent squeezing discomfort in the center of her chest. Overnight the pain became worse. It is still waxing and waning with intermittent worsening of the pain that lasts for a minute or so and then eases off. No associated shortness of breath.      Past Medical History:  Diagnosis Date  . Anemia   . Diabetes mellitus without complication ([place])   . Diverticulitis 08/2013  . Hypertension     Patient Active Problem List   Diagnosis Date Noted  . Diabetes mellitus without complication ([place])   . Hypertension   . 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 Essential hypertension 05/11/2015  . History of anxiety 05/11/2015  . Menorrhagia 01/02/2015  . Diverticulitis of colon without hemorrhage 10/13/2013  . Anemia 10/13/2013    Past Surgical History:  Procedure Laterality Date  . BREAST SURGERY     reconstruction  . LAPAROTOMY Right 01/02/2015   Procedure: EXPLORATORY LAPAROTOMY WITH RIGHT OVARIAN CYSTECTOMY;  Surgeon: Marylynn Pearson, MD;  Location: [facility];  Service: Gynecology;  Laterality: Right;  . LYSIS OF ADHESION N/A 01/02/2015   Procedure: LYSIS OF ADHESION;  Surgeon: Marylynn Pearson, MD;  Location: [facility];  Service: Gynecology;  Laterality: N/A;  . MYOMECTOMY      OB History    Gravida Para Term Preterm AB Living   0 0 0 0 0 0   SAB TAB Ectopic Multiple Live Births   0 0 0 0         Home Medications    Prior to Admission medications   Medication Sig Start Date End Date Taking? Authorizing Provider  ferrous sulfate 325 (65 FE) MG tablet Take 650 mg by mouth daily with breakfast.     Yes Historical Provider, MD  losartan-hydrochlorothiazide (HYZAAR) 100-12.5 MG tablet Take 1 tablet by mouth daily. 07/04/16  Yes Vickie L Henson, NP-C  norethindrone (AYGESTIN) 5 MG tablet Take 5 mg by mouth daily.   Yes Historical Provider, MD  pantoprazole (PROTONIX) 20 MG tablet Take 1 tablet (20 mg total) by mouth daily. 09/06/16   Orpah Greek, MD    Family History Family History  Problem Relation Age of Onset  . Diabetes Mother   . Hypertension Mother   . Diabetes Sister   . Diabetes Maternal Grandmother   . Hypertension Maternal Grandmother   . Hypertension    . Diabetes    . Breast cancer    . Ovarian cancer    . Cancer Maternal Aunt     Social History Social History  Substance Use Topics  . Smoking status: Never Smoker  . Smokeless tobacco: Never Used  . Alcohol use No     Allergies   Codeine   Review of Systems Review of Systems  Cardiovascular: Positive for chest pain.  All other systems reviewed and are negative.    Physical Exam Updated Vital Signs BP (!) 160/106   Pulse 63   Temp 98.8 F (37.1 C) (Oral)   Resp 19   Ht 5\' 6"  (1.676 m)   Wt (!) 415 lb (188.2 kg)   LMP  03/03/2016 (Within Weeks)   SpO2 99%   BMI 66.98 kg/m   Physical Exam  Constitutional: She is oriented to person, place, and time. She appears well-developed and well-nourished. No distress.  HENT:  Head: Normocephalic and atraumatic.  Right Ear: Hearing normal.  Left Ear: Hearing normal.  Nose: Nose normal.  Mouth/Throat: Oropharynx is clear and moist and mucous membranes are normal.  Eyes: Conjunctivae and EOM are normal. Pupils are equal, round, and reactive to light.  Neck: Normal range of motion. Neck supple.  Cardiovascular: Regular Amy, S1 normal and S2 normal.  Exam reveals no gallop and no friction rub.   No murmur heard. Pulmonary/Chest: Effort normal and breath sounds normal. No respiratory distress. She exhibits no tenderness.  Abdominal: Soft. Normal  appearance and bowel sounds are normal. There is no hepatosplenomegaly. There is no tenderness. There is no rebound, no guarding, no tenderness at McBurney's point and negative Murphy's sign. No hernia.  Musculoskeletal: Normal range of motion.  Neurological: She is alert and oriented to person, place, and time. She has normal strength. No cranial nerve deficit or sensory deficit. Coordination normal. GCS eye subscore is 4. GCS verbal subscore is 5. GCS motor subscore is 6.  Skin: Skin is warm, dry and intact. No rash noted. No cyanosis.  Psychiatric: She has a normal mood and affect. Her speech is normal and behavior is normal. Thought content normal.  Nursing note and vitals reviewed.    ED Treatments / Results  Labs (all labs ordered are listed, but only abnormal results are displayed) Labs Reviewed  BASIC METABOLIC PANEL - Abnormal; Notable for the following:       Result Value   Glucose, Bld 150 (*)    BUN 5 (*)    All other components within normal limits  CBC  I-STAT TROPOININ, ED  I-STAT TROPOININ, ED    EKG  EKG Interpretation  Date/Time:  Friday September 06 2016 03:57:37 EDT Ventricular Rate:  67 PR Interval:  138 QRS Duration: 90 QT Interval:  398 QTC Calculation: 420 R Axis:   22 Text Interpretation:  Normal sinus Amy Normal ECG Confirmed by Izayiah Tibbitts  MD, Olvin Rohr [phone]) on 09/06/2016 4:17:59 AM       Radiology Dg Chest 2 View  Result Date: 09/06/2016 CLINICAL DATA:  Chest pain and dyspnea for 12 hours EXAM: CHEST  2 VIEW COMPARISON:  None. FINDINGS: The lungs are clear. The pulmonary vasculature is normal. Heart size is normal. Hilar and mediastinal contours are unremarkable. There is no pleural effusion. IMPRESSION: No active cardiopulmonary disease. Electronically Signed   By: Andreas Newport M.D.   On: 09/06/2016 05:31    Procedures Procedures (including critical care time)  Medications Ordered in ED Medications  famotidine (PEPCID) IVPB 20 mg premix  (0 mg Intravenous Stopped 09/06/16 0557)  gi cocktail (Maalox,Lidocaine,Donnatal) (30 mLs Oral Given 09/06/16 0522)     Initial Impression / Assessment and Plan / ED Course  I have reviewed the triage vital signs and the nursing notes.  Pertinent labs & imaging results that were available during my care of the patient were reviewed by me and considered in my medical decision making (see chart for details).     Patient presents with complaints of chest pain. She has been having waxing and waning pain through the night. She does not have any history of heart disease. No family history of early heart disease. Her cardiac risk factors are obesity and hypertension. Patient had improvement with GI cocktail and  Pepcid. EKG is normal. Troponin at arrival was normal. Repeat troponin was also normal. Patient is felt to be low risk, appropriate for further outpatient management. Follow-up with primary care. Will treat with Protonix.  Final Clinical Impressions(s) / ED Diagnoses   Final diagnoses:  Nonspecific chest pain    New Prescriptions New Prescriptions   PANTOPRAZOLE (PROTONIX) 20 MG TABLET    Take 1 tablet (20 mg total) by mouth daily.     Orpah Greek, MD 09/06/16 [phone]

## 2016-09-19 ENCOUNTER — Ambulatory Visit (INDEPENDENT_AMBULATORY_CARE_PROVIDER_SITE_OTHER): Payer: 59 | Admitting: Podiatry

## 2016-09-19 ENCOUNTER — Ambulatory Visit (INDEPENDENT_AMBULATORY_CARE_PROVIDER_SITE_OTHER): Payer: 59

## 2016-09-19 DIAGNOSIS — M84376A Stress fracture, unspecified foot, initial encounter for fracture: Secondary | ICD-10-CM | POA: Diagnosis not present

## 2016-09-19 DIAGNOSIS — L600 Ingrowing nail: Secondary | ICD-10-CM | POA: Diagnosis not present

## 2016-09-19 DIAGNOSIS — M79672 Pain in left foot: Secondary | ICD-10-CM

## 2016-09-19 NOTE — Progress Notes (Addendum)
Subjective:     Patient ID: Amy Banks, female   DOB: [birth year], 41 y.o.   MRN: [id number]  HPI patient presents stating that she has developed pain on top of her left foot and is wearing the boot that she had previously with soreness still noted and swelling. States it's been present for around a week   Review of Systems  All other systems reviewed and are negative.      Objective:   Physical Exam  Constitutional: She is oriented to person, place, and time.  Cardiovascular: Intact distal pulses.   Musculoskeletal: Normal range of motion.  Neurological: She is oriented to person, place, and time.  Skin: Skin is warm.  Nursing note and vitals reviewed.  neurovascular status intact muscle strength adequate range of motion within normal limits with patient noted to have significant discomfort dorsal left foot second metatarsal distal shaft that's painful when pressed and makes walking difficult. It is localized and specific to this area     Assessment:     Probability for stress fracture left foot    Plan:     H&P x-ray reviewed and discussed continued immobilization ice therapy and utilization of boot. Patient will be seen back to recheck  X-ray report indicates that there is no current sign of stress fracture but good chance long-term that there'll will be a stress fracture and that it will become apparent

## 2016-10-04 MED FILL — LOSARTAN-HCTZ 100-12.5 MG T: 100-12.5 | 30 days supply | Qty: 30 | Fill #2

## 2016-10-10 ENCOUNTER — Ambulatory Visit: Payer: 59 | Admitting: Podiatry

## 2016-10-29 MED FILL — NORETHINDRONE 5 MG TABLET: 5 | 90 days supply | Qty: 90 | Fill #0

## 2016-11-27 DIAGNOSIS — Z803 Family history of malignant neoplasm of breast: Secondary | ICD-10-CM | POA: Diagnosis not present

## 2016-11-27 DIAGNOSIS — D649 Anemia, unspecified: Secondary | ICD-10-CM | POA: Diagnosis not present

## 2016-11-27 DIAGNOSIS — N920 Excessive and frequent menstruation with regular cycle: Secondary | ICD-10-CM | POA: Diagnosis not present

## 2016-11-27 DIAGNOSIS — D259 Leiomyoma of uterus, unspecified: Secondary | ICD-10-CM | POA: Diagnosis not present

## 2016-12-02 MED FILL — LOSARTAN-HCTZ 100-12.5 MG T: 100-12.5 | 30 days supply | Qty: 30 | Fill #3

## 2016-12-09 MED FILL — BELVIQ XR 20 MG TABLET: 20 | 30 days supply | Qty: 30 | Fill #0

## 2016-12-25 DIAGNOSIS — N920 Excessive and frequent menstruation with regular cycle: Secondary | ICD-10-CM | POA: Diagnosis not present

## 2016-12-25 DIAGNOSIS — Z1151 Encounter for screening for human papillomavirus (HPV): Secondary | ICD-10-CM | POA: Diagnosis not present

## 2016-12-25 DIAGNOSIS — D259 Leiomyoma of uterus, unspecified: Secondary | ICD-10-CM | POA: Diagnosis not present

## 2016-12-25 DIAGNOSIS — N946 Dysmenorrhea, unspecified: Secondary | ICD-10-CM | POA: Diagnosis not present

## 2017-01-02 ENCOUNTER — Other Ambulatory Visit: Payer: Self-pay | Admitting: Obstetrics and Gynecology

## 2017-01-02 DIAGNOSIS — D259 Leiomyoma of uterus, unspecified: Secondary | ICD-10-CM

## 2017-01-03 MED FILL — LOSARTAN-HCTZ 100-12.5 MG T: 100-12.5 | 30 days supply | Qty: 30 | Fill #4

## 2017-01-06 MED FILL — BELVIQ XR 20 MG TABLET: 20 | 30 days supply | Qty: 30 | Fill #1

## 2017-01-12 ENCOUNTER — Ambulatory Visit
Admission: RE | Admit: 2017-01-12 | Discharge: 2017-01-12 | Disposition: A | Discharge status: Home / Self Care | Payer: 59 | Source: Ambulatory Visit | Attending: Obstetrics and Gynecology | Admitting: Obstetrics and Gynecology

## 2017-01-12 DIAGNOSIS — D259 Leiomyoma of uterus, unspecified: Secondary | ICD-10-CM | POA: Diagnosis not present

## 2017-01-12 MED ORDER — GADOBENATE DIMEGLUMINE 529 MG/ML IV SOLN
20.0000 mL | Freq: Once | INTRAVENOUS | Status: AC | PRN
  Administered 2017-01-12: 20 mL via INTRAVENOUS

## 2017-01-15 MED FILL — NORETHINDRONE 5 MG TABLET: 5 | 90 days supply | Qty: 90 | Fill #1

## 2017-01-29 DIAGNOSIS — D259 Leiomyoma of uterus, unspecified: Secondary | ICD-10-CM | POA: Diagnosis not present

## 2017-01-29 DIAGNOSIS — Z3169 Encounter for other general counseling and advice on procreation: Secondary | ICD-10-CM | POA: Diagnosis not present

## 2017-02-10 MED FILL — LOSARTAN-HCTZ 100-12.5 MG T: 100-12.5 | 30 days supply | Qty: 30 | Fill #5

## 2017-02-13 ENCOUNTER — Encounter: Payer: Self-pay | Admitting: Family Medicine

## 2017-02-13 ENCOUNTER — Ambulatory Visit (INDEPENDENT_AMBULATORY_CARE_PROVIDER_SITE_OTHER): Payer: 59 | Admitting: Family Medicine

## 2017-02-13 VITALS — BP 124/84 | HR 78 | Ht 66.0 in | Wt 321.8 lb

## 2017-02-13 DIAGNOSIS — I10 Essential (primary) hypertension: Secondary | ICD-10-CM

## 2017-02-13 DIAGNOSIS — E1165 Type 2 diabetes mellitus with hyperglycemia: Secondary | ICD-10-CM | POA: Diagnosis not present

## 2017-02-13 DIAGNOSIS — D219 Benign neoplasm of connective and other soft tissue, unspecified: Secondary | ICD-10-CM | POA: Insufficient documentation

## 2017-02-13 DIAGNOSIS — R81 Glycosuria: Secondary | ICD-10-CM

## 2017-02-13 DIAGNOSIS — IMO0001 Reserved for inherently not codable concepts without codable children: Secondary | ICD-10-CM

## 2017-02-13 DIAGNOSIS — R7309 Other abnormal glucose: Secondary | ICD-10-CM | POA: Insufficient documentation

## 2017-02-13 DIAGNOSIS — Z0000 Encounter for general adult medical examination without abnormal findings: Secondary | ICD-10-CM

## 2017-02-13 LAB — POCT URINALYSIS DIP (PROADVANTAGE DEVICE)
BILIRUBIN UA: NEGATIVE
BILIRUBIN UA: NEGATIVE mg/dL
LEUKOCYTES UA: NEGATIVE
Nitrite, UA: NEGATIVE
Protein Ur, POC: NEGATIVE mg/dL
SPECIFIC GRAVITY, URINE: 1.025
Urobilinogen, Ur: NEGATIVE
pH, UA: 6 (ref 5.0–8.0)

## 2017-02-13 LAB — POCT GLYCOSYLATED HEMOGLOBIN (HGB A1C): Hemoglobin A1C: 9

## 2017-02-13 MED ORDER — METFORMIN HCL ER 500 MG PO TB24: 500.0000 mg | ORAL_TABLET | Freq: Every day | ORAL | 1 refills | Status: DC

## 2017-02-13 MED FILL — METFORMIN HCL ER 500 MG TAB: 500 | 30 days supply | Qty: 30 | Fill #0

## 2017-02-13 NOTE — Patient Instructions (Addendum)
Start the once daily Metformin and let's see how you are doing with this and lifestyle changes. Cut back on sugar and carbohydrates and start walking or doing some form of exercise at least 20 minutes per day.   Goal blood sugar readings (fasting) 90-130. 2 hours after eating the goal is 130-160.  Start checking your fasting blood sugars daily and keep a record of these.   The nutritionist will call you to schedule an appointment.   Call me in 2 weeks with your blood sugar readings.  Follow up in 1 month.   Preventative Care for Adults - Female      MAINTAIN REGULAR HEALTH EXAMS:  A routine yearly physical is a good way to check in with your primary care provider about your health and preventive screening. It is also an opportunity to share updates about your health and any concerns you have, and receive a thorough all-over exam.   Most health insurance companies pay for at least some preventative services.  Check with your health plan for specific coverages.  WHAT PREVENTATIVE SERVICES DO WOMEN NEED?  Adult women should have their weight and blood pressure checked regularly.   Women age 55 and older should have their cholesterol levels checked regularly.  Women should be screened for cervical cancer with a Pap smear and pelvic exam beginning at either age 50, or 3 years after they become sexually activity.    Breast cancer screening generally begins at age 53 with a mammogram and breast exam by your primary care provider.    Beginning at age 59 and continuing to age 70, women should be screened for colorectal cancer.  Certain people may need continued testing until age 60.  Updating vaccinations is part of preventative care.  Vaccinations help protect against diseases such as the flu.  Osteoporosis is a disease in which the bones lose minerals and strength as we age. Women ages 45 and over should discuss this with their caregivers, as should women after menopause who have other risk  factors.  Lab tests are generally done as part of preventative care to screen for anemia and blood disorders, to screen for problems with the kidneys and liver, to screen for bladder problems, to check blood sugar, and to check your cholesterol level.  Preventative services generally include counseling about diet, exercise, avoiding tobacco, drugs, excessive alcohol consumption, and sexually transmitted infections.    GENERAL RECOMMENDATIONS FOR GOOD HEALTH:  Healthy diet:  Eat a variety of foods, including fruit, vegetables, animal or vegetable protein, such as meat, fish, chicken, and eggs, or beans, lentils, tofu, and grains, such as rice.  Drink plenty of water daily.  Decrease saturated fat in the diet, avoid lots of red meat, processed foods, sweets, fast foods, and fried foods.  Exercise:  Aerobic exercise helps maintain good heart health. At least 30-40 minutes of moderate-intensity exercise is recommended. For example, a brisk walk that increases your heart rate and breathing. This should be done on most days of the week.   Find a type of exercise or a variety of exercises that you enjoy so that it becomes a part of your daily life.  Examples are running, walking, swimming, water aerobics, and biking.  For motivation and support, explore group exercise such as aerobic class, spin class, Zumba, Yoga,or  martial arts, etc.    Set exercise goals for yourself, such as a certain weight goal, walk or run in a race such as a 5k walk/run.  Speak to  your primary care provider about exercise goals.  Disease prevention:  If you smoke or chew tobacco, find out from your caregiver how to quit. It can literally save your life, no matter how long you have been a tobacco user. If you do not use tobacco, never begin.   Maintain a healthy diet and normal weight. Increased weight leads to problems with blood pressure and diabetes.   The Body Mass Index or BMI is a way of measuring how much of  your body is fat. Having a BMI above 27 increases the risk of heart disease, diabetes, hypertension, stroke and other problems related to obesity. Your caregiver can help determine your BMI and based on it develop an exercise and dietary program to help you achieve or maintain this important measurement at a healthful level.  High blood pressure causes heart and blood vessel problems.  Persistent high blood pressure should be treated with medicine if weight loss and exercise do not work.   Fat and cholesterol leaves deposits in your arteries that can block them. This causes heart disease and vessel disease elsewhere in your body.  If your cholesterol is found to be high, or if you have heart disease or certain other medical conditions, then you may need to have your cholesterol monitored frequently and be treated with medication.   Ask if you should have a cardiac stress test if your history suggests this. A stress test is a test done on a treadmill that looks for heart disease. This test can find disease prior to there being a problem.  Menopause can be associated with physical symptoms and risks. Hormone replacement therapy is available to decrease these. You should talk to your caregiver about whether starting or continuing to take hormones is right for you.   Osteoporosis is a disease in which the bones lose minerals and strength as we age. This can result in serious bone fractures. Risk of osteoporosis can be identified using a bone density scan. Women ages 30 and over should discuss this with their caregivers, as should women after menopause who have other risk factors. Ask your caregiver whether you should be taking a calcium supplement and Vitamin D, to reduce the rate of osteoporosis.   Avoid drinking alcohol in excess (more than two drinks per day).  Avoid use of street drugs. Do not share needles with anyone. Ask for professional help if you need assistance or instructions on stopping the use  of alcohol, cigarettes, and/or drugs.  Brush your teeth twice a day with fluoride toothpaste, and floss once a day. Good oral hygiene prevents tooth decay and gum disease. The problems can be painful, unattractive, and can cause other health problems. Visit your dentist for a routine oral and dental check up and preventive care every 6-12 months.   Look at your skin regularly.  Use a mirror to look at your back. Notify your caregivers of changes in moles, especially if there are changes in shapes, colors, a size larger than a pencil eraser, an irregular border, or development of new moles.  Safety:  Use seatbelts 100% of the time, whether driving or as a passenger.  Use safety devices such as hearing protection if you work in environments with loud noise or significant background noise.  Use safety glasses when doing any work that could send debris in to the eyes.  Use a helmet if you ride a bike or motorcycle.  Use appropriate safety gear for contact sports.  Talk to your caregiver  about gun safety.  Use sunscreen with a SPF (or skin protection factor) of 15 or greater.  Lighter skinned people are at a greater risk of skin cancer. Don't forget to also wear sunglasses in order to protect your eyes from too much damaging sunlight. Damaging sunlight can accelerate cataract formation.   Practice safe sex. Use condoms. Condoms are used for birth control and to help reduce the spread of sexually transmitted infections (or STIs).  Some of the STIs are gonorrhea (the clap), chlamydia, syphilis, trichomonas, herpes, HPV (human papilloma virus) and HIV (human immunodeficiency virus) which causes AIDS. The herpes, HIV and HPV are viral illnesses that have no cure. These can result in disability, cancer and death.   Keep carbon monoxide and smoke detectors in your home functioning at all times. Change the batteries every 6 months or use a model that plugs into the wall.   Vaccinations:  Stay up to date with  your tetanus shots and other required immunizations. You should have a booster for tetanus every 10 years. Be sure to get your flu shot every year, since 5%-20% of the [place] population comes down with the flu. The flu vaccine changes each year, so being vaccinated once is not enough. Get your shot in the fall, before the flu season peaks.   Other vaccines to consider:  Human Papilloma Virus or HPV causes cancer of the cervix, and other infections that can be transmitted from person to person. There is a vaccine for HPV, and females should get immunized between the ages of 32 and 34. It requires a series of 3 shots.   Pneumococcal vaccine to protect against certain types of pneumonia.  This is normally recommended for adults age 16 or older.  However, adults younger than 39 years old with certain underlying conditions such as diabetes, heart or lung disease should also receive the vaccine.  Shingles vaccine to protect against Varicella Zoster if you are older than age 77, or younger than 39 years old with certain underlying illness.  Hepatitis A vaccine to protect against a form of infection of the liver by a virus acquired from food.  Hepatitis B vaccine to protect against a form of infection of the liver by a virus acquired from blood or body fluids, particularly if you work in health care.  If you plan to travel internationally, check with your local health department for specific vaccination recommendations.  Cancer Screening:  Breast cancer screening is essential to preventive care for women. All women age 81 and older should perform a breast self-exam every month. At age 29 and older, women should have their caregiver complete a breast exam each year. Women at ages 14 and older should have a mammogram (x-ray film) of the breasts. Your caregiver can discuss how often you need mammograms.    Cervical cancer screening includes taking a Pap smear (sample of cells examined under a microscope) from  the cervix (end of the uterus). It also includes testing for HPV (Human Papilloma Virus, which can cause cervical cancer). Screening and a pelvic exam should begin at age 93, or 3 years after a woman becomes sexually active. Screening should occur every year, with a Pap smear but no HPV testing, up to age 51. After age 25, you should have a Pap smear every 3 years with HPV testing, if no HPV was found previously.   Most routine colon cancer screening begins at the age of 78. On a yearly basis, doctors may provide special easy  to use take-home tests to check for hidden blood in the stool. Sigmoidoscopy or colonoscopy can detect the earliest forms of colon cancer and is life saving. These tests use a small camera at the end of a tube to directly examine the colon. Speak to your caregiver about this at age 4, when routine screening begins (and is repeated every 5 years unless early forms of pre-cancerous polyps or small growths are found).

## 2017-02-13 NOTE — Progress Notes (Signed)
Subjective:    Patient ID: Amy Banks, female    DOB: March 07, 1976, 41 y.o.   MRN: [id number]  HPI Chief Complaint  Patient presents with  . cpe    non fasting cpe. sees obgyn. will get flu shot at work. wants her toe looked at. eye exam done in january   She is here for a complete physical exam. Previous medical care: no PCP.  Last CPE: years ago.   History of prediabetes per patient in 2017. She was taking Metformin last year and states she was told she could stop taking. States her fertility specialist was treating her for pre diabetes and PCOS.   Other providers: OB/GYN Dr. Benjie Karvonen at [facility]  Dr. Kerin Perna is surgeon she will see for fibroids in November.    Social history: Lives with husband, works as a Education officer, museum.   Diet: nothing particular  Excerise:  None   Immunizations: [employer] employee.   Health maintenance:  Mammogram: never  Colonoscopy: never  Last Gynecological Exam: pap smear in July 2017 and normal.  Last Menstrual cycle: 03/18/2017 Pregnancies: 0 Last Dental Exam: annually  Last Eye Exam: January 2018. [employer] [place].   Wears seatbelt always, smoke detectors in home and functioning, does not text while driving and feels safe in home environment.   Reviewed allergies, medications, past medical, surgical, family, and social history.   Review of Systems Review of Systems Constitutional: -fever, -chills, -sweats, -unexpected weight change,-fatigue ENT: -runny nose, -ear pain, -sore throat Cardiology:  -chest pain, -palpitations, -edema Respiratory: -cough, -shortness of breath, -wheezing Gastroenterology: -abdominal pain, -nausea, -vomiting, -diarrhea, -constipation  Hematology: -bleeding or bruising problems Musculoskeletal: -arthralgias, -myalgias, -joint swelling, -back pain Ophthalmology: -vision changes Urology: -dysuria, -difficulty urinating, -hematuria, -urinary frequency, -urgency Neurology: -headache, -weakness,  -tingling, -numbness       Objective:   Physical Exam BP 124/84   Pulse 78   Ht 5\' 6"  (1.676 m)   Wt (!) 321 lb 12.8 oz (146 kg)   LMP 03/18/2016   BMI 51.94 kg/m   General Appearance:    Alert, cooperative, no distress, appears stated age  Head:    Normocephalic, without obvious abnormality, atraumatic  Eyes:    PERRL, conjunctiva/corneas clear, EOM's intact, fundi    benign  Ears:    Normal TM's and external ear canals  Nose:   Nares normal, mucosa normal, no drainage or sinus   tenderness  Throat:   Lips, mucosa, and tongue normal; teeth and gums normal  Neck:   Supple, no lymphadenopathy;  thyroid:  no   enlargement/tenderness/nodules; no carotid   bruit or JVD  Back:    Spine nontender, no curvature, ROM normal, no CVA     tenderness  Lungs:     Clear to auscultation bilaterally without wheezes, rales or     ronchi; respirations unlabored  Chest Wall:    No tenderness or deformity   Heart:    Regular rate and rhythm, S1 and S2 normal, no murmur, rub   or gallop  Breast Exam:    Done at OB/GYN  Abdomen:     Soft, non-tender, nondistended, normoactive bowel sounds,    no masses, no hepatosplenomegaly  Genitalia:    Done at OB/GYN  Rectal:    Not performed due to age<40 and no related complaints  Extremities:   No clubbing, cyanosis or edema  Pulses:   2+ and symmetric all extremities  Skin:   Skin color, texture, turgor normal, no rashes  or lesions  Lymph nodes:   Cervical, supraclavicular, and axillary nodes normal  Neurologic:   CNII-XII intact, normal strength, sensation and gait; reflexes 2+ and symmetric throughout          Psych:   Normal mood, affect, hygiene and grooming.     Urinalysis dipstick: glucose >1,000       Assessment & Plan:  Routine general medical examination at a health care facility - Plan: CBC with Differential/Platelet, Comprehensive metabolic panel, POCT Urinalysis DIP (Proadvantage Device), TSH  Essential hypertension - Plan: CBC with  Differential/Platelet, Comprehensive metabolic panel  Morbid obesity (HCC) - Plan: CBC with Differential/Platelet, Comprehensive metabolic panel, TSH, Amb ref to Medical Nutrition Therapy-MNT  Uncontrolled type 2 diabetes mellitus without complication, without long-term current use of insulin (HCC) - Plan: CBC with Differential/Platelet, Comprehensive metabolic panel, Microalbumin / creatinine urine ratio, POCT glycosylated hemoglobin (Hb A1C), Amb ref to Medical Nutrition Therapy-MNT, metFORMIN (GLUCOPHAGE XR) 500 MG 24 hr tablet  Discussed that she has uncontrolled diabetes and educated her on management and potential health risks of uncontrolled blood sugars.  Start on Metformin for now and make significant lifestyle changes. Counseled on carbohydrates, avoiding Everhart foods and not drinking sugary drinks. Also recommend her to start being more active.  Check blood sugars daily and keep a record.  She does not appear to be symptomatic.  HTN- BP at goal. Continue on current medication.  Reports having a dilated eye exam in January and we will attempt to get records.  Foot exam done. microalbumin done.  Referral made to MNT for diabetes, HTN, morbid obesity.  Asked her to call me in 2 weeks with her blood sugar readings.  Will have her follow up in 4 weeks.  She will need to have fasting lipids.

## 2017-02-14 LAB — CBC WITH DIFFERENTIAL/PLATELET
BASOS PCT: 0.4 %
Basophils Absolute: 33 cells/uL (ref 0–200)
EOS PCT: 0.8 %
Eosinophils Absolute: 66 cells/uL (ref 15–500)
HCT: 40.8 % (ref 35.0–45.0)
Hemoglobin: 13.3 g/dL (ref 11.7–15.5)
LYMPHS ABS: 3187 {cells}/uL (ref 850–3900)
MCH: 27.7 pg (ref 27.0–33.0)
MCHC: 32.6 g/dL (ref 32.0–36.0)
MCV: 84.8 fL (ref 80.0–100.0)
MPV: 11 fL (ref 7.5–12.5)
Monocytes Relative: 5.2 %
Neutro Abs: 4582 cells/uL (ref 1500–7800)
Neutrophils Relative %: 55.2 %
PLATELETS: 365 10*3/uL (ref 140–400)
RBC: 4.81 10*6/uL (ref 3.80–5.10)
RDW: 12.1 % (ref 11.0–15.0)
Total Lymphocyte: 38.4 %
WBC: 8.3 10*3/uL (ref 3.8–10.8)
WBCMIX: 432 {cells}/uL (ref 200–950)

## 2017-02-14 LAB — COMPREHENSIVE METABOLIC PANEL
AG Ratio: 1.3 (calc) (ref 1.0–2.5)
ALBUMIN MSPROF: 4.3 g/dL (ref 3.6–5.1)
ALKALINE PHOSPHATASE (APISO): 70 U/L (ref 33–115)
ALT: 10 U/L (ref 6–29)
AST: 13 U/L (ref 10–30)
BUN: 7 mg/dL (ref 7–25)
CHLORIDE: 102 mmol/L (ref 98–110)
CO2: 28 mmol/L (ref 20–32)
CREATININE: 0.85 mg/dL (ref 0.50–1.10)
Calcium: 9.8 mg/dL (ref 8.6–10.2)
GLOBULIN: 3.3 g/dL (ref 1.9–3.7)
Glucose, Bld: 160 mg/dL — ABNORMAL HIGH (ref 65–99)
POTASSIUM: 4.1 mmol/L (ref 3.5–5.3)
SODIUM: 139 mmol/L (ref 135–146)
TOTAL PROTEIN: 7.6 g/dL (ref 6.1–8.1)
Total Bilirubin: 0.5 mg/dL (ref 0.2–1.2)

## 2017-02-14 LAB — MICROALBUMIN / CREATININE URINE RATIO
CREATININE, URINE: 217 mg/dL (ref 20–275)
MICROALB UR: 1.7 mg/dL
MICROALB/CREAT RATIO: 8 ug/mg{creat} (ref <30)

## 2017-02-14 LAB — TSH: TSH: 1.52 m[IU]/L

## 2017-02-26 ENCOUNTER — Ambulatory Visit (INDEPENDENT_AMBULATORY_CARE_PROVIDER_SITE_OTHER): Payer: 59 | Admitting: Family Medicine

## 2017-02-26 ENCOUNTER — Encounter: Payer: Self-pay | Admitting: Family Medicine

## 2017-02-26 VITALS — BP 120/80 | HR 61 | Wt 326.4 lb

## 2017-02-26 DIAGNOSIS — F329 Major depressive disorder, single episode, unspecified: Secondary | ICD-10-CM | POA: Diagnosis not present

## 2017-02-26 DIAGNOSIS — F419 Anxiety disorder, unspecified: Secondary | ICD-10-CM | POA: Diagnosis not present

## 2017-02-26 DIAGNOSIS — F4310 Post-traumatic stress disorder, unspecified: Secondary | ICD-10-CM | POA: Diagnosis not present

## 2017-02-26 DIAGNOSIS — F32A Depression, unspecified: Secondary | ICD-10-CM

## 2017-02-26 MED ORDER — ALPRAZOLAM 0.25 MG PO TABS: 0.2500 mg | ORAL_TABLET | Freq: Two times a day (BID) | ORAL | 0 refills | Status: DC | PRN

## 2017-02-26 MED FILL — ALPRAZolam 0.25 MG TABS: 0.25 | 6 days supply | Qty: 12 | Fill #0

## 2017-02-26 NOTE — Patient Instructions (Addendum)
You can call to schedule your appointment with the psychiatrist and therapist.  A few offices are listed below for you to call.   [facility]  [address], [place], [place], [place] [address]  Phone: [phone]  [facility] [address] [place], [place] [address]  Phone: [facility] (outpatient)         [phone]
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
## 2017-03-03 DIAGNOSIS — N808 Other endometriosis: Secondary | ICD-10-CM | POA: Diagnosis not present

## 2017-03-03 DIAGNOSIS — D250 Submucous leiomyoma of uterus: Secondary | ICD-10-CM | POA: Diagnosis not present

## 2017-03-03 DIAGNOSIS — E288 Other ovarian dysfunction: Secondary | ICD-10-CM | POA: Diagnosis not present

## 2017-03-03 DIAGNOSIS — Z319 Encounter for procreative management, unspecified: Secondary | ICD-10-CM | POA: Diagnosis not present

## 2017-03-03 DIAGNOSIS — D251 Intramural leiomyoma of uterus: Secondary | ICD-10-CM | POA: Diagnosis not present

## 2017-03-04 MED FILL — LETROZOLE 2.5 MG TABLET: 2.5 | 30 days supply | Qty: 30 | Fill #0

## 2017-03-04 MED FILL — DANAZOL 200 MG CAPSULE: 200 | 30 days supply | Qty: 60 | Fill #0

## 2017-03-26 ENCOUNTER — Ambulatory Visit: Payer: 59 | Admitting: Family Medicine

## 2017-04-08 ENCOUNTER — Encounter (HOSPITAL_COMMUNITY): Payer: Self-pay | Admitting: *Deleted

## 2017-04-08 ENCOUNTER — Inpatient Hospital Stay (HOSPITAL_COMMUNITY)
Admission: AD | Admit: 2017-04-08 | Discharge: 2017-04-08 | Disposition: A | Discharge status: Home / Self Care | Payer: 59 | Source: Ambulatory Visit | Attending: Obstetrics and Gynecology | Admitting: Obstetrics and Gynecology

## 2017-04-08 DIAGNOSIS — E119 Type 2 diabetes mellitus without complications: Secondary | ICD-10-CM | POA: Diagnosis not present

## 2017-04-08 DIAGNOSIS — Z79899 Other long term (current) drug therapy: Secondary | ICD-10-CM | POA: Insufficient documentation

## 2017-04-08 DIAGNOSIS — Z8249 Family history of ischemic heart disease and other diseases of the circulatory system: Secondary | ICD-10-CM | POA: Diagnosis not present

## 2017-04-08 DIAGNOSIS — D649 Anemia, unspecified: Secondary | ICD-10-CM | POA: Diagnosis not present

## 2017-04-08 DIAGNOSIS — Z6841 Body Mass Index (BMI) 40.0 and over, adult: Secondary | ICD-10-CM | POA: Insufficient documentation

## 2017-04-08 DIAGNOSIS — Z7984 Long term (current) use of oral hypoglycemic drugs: Secondary | ICD-10-CM | POA: Insufficient documentation

## 2017-04-08 DIAGNOSIS — N939 Abnormal uterine and vaginal bleeding, unspecified: Secondary | ICD-10-CM | POA: Diagnosis not present

## 2017-04-08 DIAGNOSIS — D259 Leiomyoma of uterus, unspecified: Secondary | ICD-10-CM | POA: Insufficient documentation

## 2017-04-08 DIAGNOSIS — R102 Pelvic and perineal pain: Secondary | ICD-10-CM | POA: Diagnosis not present

## 2017-04-08 DIAGNOSIS — Z885 Allergy status to narcotic agent status: Secondary | ICD-10-CM | POA: Insufficient documentation

## 2017-04-08 DIAGNOSIS — I10 Essential (primary) hypertension: Secondary | ICD-10-CM | POA: Diagnosis not present

## 2017-04-08 DIAGNOSIS — Z833 Family history of diabetes mellitus: Secondary | ICD-10-CM | POA: Insufficient documentation

## 2017-04-08 LAB — CBC
HCT: 41.1 % (ref 36.0–46.0)
Hemoglobin: 13.3 g/dL (ref 12.0–15.0)
MCH: 27.8 pg (ref 26.0–34.0)
MCHC: 32.4 g/dL (ref 30.0–36.0)
MCV: 86 fL (ref 78.0–100.0)
PLATELETS: 374 10*3/uL (ref 150–400)
RBC: 4.78 MIL/uL (ref 3.87–5.11)
RDW: 14.3 % (ref 11.5–15.5)
WBC: 6.1 10*3/uL (ref 4.0–10.5)

## 2017-04-08 LAB — WET PREP, GENITAL
Clue Cells Wet Prep HPF POC: NONE SEEN
Sperm: NONE SEEN
Trich, Wet Prep: NONE SEEN
YEAST WET PREP: NONE SEEN

## 2017-04-08 MED ORDER — LOSARTAN POTASSIUM 50 MG PO TABS
100.0000 mg | ORAL_TABLET | Freq: Once | ORAL | Status: AC
  Administered 2017-04-08: 100 mg via ORAL
  Filled 2017-04-08: qty 2

## 2017-04-08 MED ORDER — HYDROCHLOROTHIAZIDE 12.5 MG PO CAPS
12.5000 mg | ORAL_CAPSULE | Freq: Once | ORAL | Status: AC
  Administered 2017-04-08: 12.5 mg via ORAL
  Filled 2017-04-08: qty 1

## 2017-04-08 NOTE — MAU Provider Note (Signed)
History     CSN: [id number]  Arrival date and time: 04/08/17 0756   First Provider Initiated Contact with Patient 04/08/17 [phone]      Chief Complaint  Patient presents with  . Vaginal Bleeding   Amy Banks is a 41 y.o. G0P0000 who presents today with vaginal bleeding. She states that she has not had a period in about one year (03/2016) because she has been on medication to suppress her cycles. She has been on danizol and femara rx by Dr. Kerin Banks. She states that on 04/06/17 she started cramping and spotting. The on 04/07/17 she started to have heavier bleeding and clots. This has continue since then. She is going through a pad about every 1-2 hours. She is also having cramps that awaken her from her sleep. She called Dr. Kerin Banks this morning, and he recommended that she be seen in the ER today. She has not taken her blood pressure medication. She is schedule for myomectomy in January with Amy Banks.    Vaginal Bleeding  The patient's primary symptoms include pelvic pain and vaginal bleeding. This is a new problem. The current episode started in the past 7 days. The problem occurs constantly. The problem has been gradually worsening. The pain is severe. The problem affects both sides. She is not pregnant. The vaginal discharge was bloody. The vaginal bleeding is heavier than menses. She has been passing clots. She has not been passing tissue. Nothing aggravates the symptoms. Treatments tried: took danizol this morning and advil 800mg  about 0550.     Past Medical History:  Diagnosis Date  . Anemia   . Diabetes mellitus without complication ([place])   . Diverticulitis 08/2013  . Fibroids   . Hypertension   . Morbid obesity ([place])     Past Surgical History:  Procedure Laterality Date  . BREAST SURGERY     reconstruction  . MYOMECTOMY      Family History  Problem Relation Age of Onset  . Diabetes Mother   . Hypertension Mother   . Diabetes Sister   . Diabetes Maternal  Grandmother   . Hypertension Maternal Grandmother   . Hypertension Unknown   . Diabetes Unknown   . Breast cancer Unknown   . Ovarian cancer Unknown   . Cancer Maternal Aunt     Social History   Tobacco Use  . Smoking status: Never Smoker  . Smokeless tobacco: Never Used  Substance Use Topics  . Alcohol use: No  . Drug use: No    Allergies:  Allergies  Allergen Reactions  . Codeine Hives and Swelling    Medications Prior to Admission  Medication Sig Dispense Refill Last Dose  . ALPRAZolam (XANAX) 0.25 MG tablet Take 1 tablet (0.25 mg total) by mouth 2 (two) times daily as needed for anxiety. 12 tablet 0   . ferrous sulfate 325 (65 FE) MG tablet Take 650 mg by mouth daily with breakfast.    Taking  . losartan-hydrochlorothiazide (HYZAAR) 100-12.5 MG tablet Take 1 tablet by mouth daily. 30 tablet 5 Taking  . metFORMIN (GLUCOPHAGE XR) 500 MG 24 hr tablet Take 1 tablet (500 mg total) by mouth daily with breakfast. 30 tablet 1 Taking  . norethindrone (AYGESTIN) 5 MG tablet Take 5 mg by mouth daily.   Taking  . pantoprazole (PROTONIX) 20 MG tablet Take 1 tablet (20 mg total) by mouth daily. 60 tablet 0 Taking    Review of Systems  Genitourinary: Positive for pelvic pain and vaginal bleeding.  Physical Exam   Blood pressure (!) 216/116, pulse 67, temperature 98.6 F (37 C), temperature source Oral, resp. rate 18, height 5\' 6"  (1.676 m), weight (!) 333 lb (151 kg), SpO2 98 %.  Physical Exam  Nursing note and vitals reviewed. Constitutional: She is oriented to person, place, and time. She appears well-developed and well-nourished. No distress.  HENT:  Head: Normocephalic.  Cardiovascular: Normal rate.  Respiratory: Effort normal.  GI: Soft. There is no tenderness. There is no rebound.  Genitourinary:  Genitourinary Comments:  External: no lesion Vagina: small amount of blood seen  Cervix: pink, smooth, no CMT Uterus: difficult to assess 2/2 body habitus    Neurological: She is alert and oriented to person, place, and time.  Skin: Skin is warm and dry.  Psychiatric: She has a normal mood and affect.   Results for orders placed or performed during the hospital encounter of 04/08/17 (from the past 24 hour(s))  Wet prep, genital     Status: Abnormal   Collection Time: 04/08/17  9:10 AM  Result Value Ref Range   Yeast Wet Prep HPF POC NONE SEEN NONE SEEN   Trich, Wet Prep NONE SEEN NONE SEEN   Clue Cells Wet Prep HPF POC NONE SEEN NONE SEEN   WBC, Wet Prep HPF POC FEW (A) NONE SEEN   Sperm NONE SEEN   CBC     Status: None   Collection Time: 04/08/17  9:20 AM  Result Value Ref Range   WBC 6.1 4.0 - 10.5 K/uL   RBC 4.78 3.87 - 5.11 MIL/uL   Hemoglobin 13.3 12.0 - 15.0 g/dL   HCT 41.1 36.0 - 46.0 %   MCV 86.0 78.0 - 100.0 fL   MCH 27.8 26.0 - 34.0 pg   MCHC 32.4 30.0 - 36.0 g/dL   RDW 14.3 11.5 - 15.5 %   Platelets 374 150 - 400 K/uL     MAU Course  Procedures  MDM   1025: Left message with Dr. Kerin Banks  1020: DW Dr. Kerin Banks ok for DC home. FU with his office as planned.  Assessment and Plan   1. Abnormal uterine bleeding    DC home Comfort measures reviewed   Bleeding precautions RX: no new RX continue meds as already prescribed.  Return to MAU as needed FU with GYN as planned  Follow-up Information    Amy Specking, MD Follow up.   Specialty:  Obstetrics and Gynecology Contact information: [address] [place] [address] [phone]            Amy Banks 04/08/2017, 8:52 AM

## 2017-04-08 NOTE — MAU Note (Signed)
Pt reports she began having light bleeding and cramping on Sunday , on Monday the bleeding became much heavier and passing large clots. Also began having lower abd cramping on Sunday, advil helps for a little bit. 2 pads q 2 hours.

## 2017-04-08 NOTE — Discharge Instructions (Signed)
In late 2019, the [facility] will be moving to the [place]. At that time, the MAU will no longer serve non-pregnant patients. We encourage you to establish care with a provider before that time, so that you can be seen with any GYN concerns, like vaginal discharge, urinary tract infection, etc.. in a timely manner. In order to make the office visit more convenient, the [facility] [place] at [facility] will be offering evening hours from 4pm-7:30pm on Mondays starting 02/10/17. There will be same-day appointments, walk-in appointments and scheduled appointments available during this time.    [facility] [place] @ [facility] [phone]  For urgent needs, [facility] is also available for management of urgent GYN complaints such as vaginal discharge.   [employer] extends eligibility for family planning services to reduce unintended pregnancies and improve the well-being of children and families.   Eligible individuals whose income is at or below 195% of the federal poverty level and who are:  - [place] citizens, documented immigrants or qualified aliens;  - Residents of [place];  - Not incarcerated; and  - Not pregnant.   [employer] Family Planning Contact Information:  Medical Assistance Clinical Section Phone: [phone] Email: [email or web address]     Abnormal Uterine Bleeding Abnormal uterine bleeding can affect women at various stages in life, including teenagers, women in their reproductive years, pregnant women, and women who have reached menopause. Several kinds of uterine bleeding are considered abnormal, including:  Bleeding or spotting between periods.  Bleeding after sexual intercourse.  Bleeding that is heavier or more than normal.  Periods that last longer than usual.  Bleeding after menopause.  Many cases of abnormal uterine bleeding are minor and simple to treat, while others  are more serious. Any type of abnormal bleeding should be evaluated by your health care provider. Treatment will depend on the cause of the bleeding. Follow these instructions at home: Monitor your condition for any changes. The following actions may help to alleviate any discomfort you are experiencing:  Avoid the use of tampons and douches as directed by your health care provider.  Change your pads frequently.  You should get regular pelvic exams and Pap tests. Keep all follow-up appointments for diagnostic tests as directed by your health care provider. Contact a health care provider if:  Your bleeding lasts more than 1 week.  You feel dizzy at times. Get help right away if:  You pass out.  You are changing pads every 15 to 30 minutes.  You have abdominal pain.  You have a fever.  You become sweaty or weak.  You are passing large blood clots from the vagina.  You start to feel nauseous and vomit. This information is not intended to replace advice given to you by your health care provider. Make sure you discuss any questions you have with your health care provider. Document Released: 05/20/2005 Document Revised: 11/01/2015 Document Reviewed: 12/17/2012 Elsevier Interactive Patient Education  2017 [employer].

## 2017-04-09 LAB — GC/CHLAMYDIA PROBE AMP (~~LOC~~) NOT AT ARMC
CHLAMYDIA, DNA PROBE: NEGATIVE
NEISSERIA GONORRHEA: NEGATIVE

## 2017-04-10 ENCOUNTER — Other Ambulatory Visit: Payer: Self-pay | Admitting: Family Medicine

## 2017-04-10 MED FILL — LOSARTAN-HCTZ 100-12.5 MG T: 100-12.5 | 30 days supply | Qty: 30 | Fill #0

## 2017-04-29 MED FILL — LETROZOLE 2.5 MG TABLET: 2.5 | 30 days supply | Qty: 30 | Fill #1

## 2017-04-29 MED FILL — DANAZOL 200 MG CAPSULE: 200 | 30 days supply | Qty: 60 | Fill #1

## 2017-05-19 MED FILL — LOSARTAN-HCTZ 100-12.5 MG T: 100-12.5 | 30 days supply | Qty: 30 | Fill #1

## 2017-06-02 NOTE — Patient Instructions (Addendum)
Your procedure is scheduled on:  Friday, Jan. 11  Enter through the [employer] [facility] at:  8 am  Pick up the phone at the desk and dial [phone].  Call this number if you have problems the morning of surgery: [phone].  Remember: Do NOT eat or Do NOT drink clear liquids (including water) after midnight Thursday.  Take these medicines the morning of surgery with a SIP OF WATER:  amlodipine and xanax if needed.  Do not take your Friday morning dose of Metformin.  We will check your blood sugar upon arrival to Short Stay and treat if needed.   Do NOT wear jewelry (body piercing), metal hair clips/bobby pins, make-up, or nail polish. Do NOT wear lotions, powders, or perfumes.  You may wear deoderant. Do NOT shave for 48 hours prior to surgery. Do NOT bring valuables to the hospital.   Leave suitcase in car.  After surgery it may be brought to your room.  For patients admitted to the hospital, checkout time is 11:00 AM the day of discharge. Have a responsible adult drive you home and stay with you for 24 hours after your procedure.  Home with husband Kerry Dory cell [phone]

## 2017-06-04 ENCOUNTER — Encounter (HOSPITAL_COMMUNITY): Payer: Self-pay

## 2017-06-04 ENCOUNTER — Encounter (HOSPITAL_COMMUNITY)
Admission: RE | Admit: 2017-06-04 | Discharge: 2017-06-04 | Disposition: A | Discharge status: Home / Self Care | Payer: 59 | Source: Ambulatory Visit | Attending: Obstetrics and Gynecology | Admitting: Obstetrics and Gynecology

## 2017-06-04 ENCOUNTER — Ambulatory Visit: Payer: 59 | Admitting: Family Medicine

## 2017-06-04 ENCOUNTER — Encounter: Payer: Self-pay | Admitting: Family Medicine

## 2017-06-04 ENCOUNTER — Other Ambulatory Visit: Payer: Self-pay

## 2017-06-04 VITALS — BP 190/130 | HR 99 | Wt 336.4 lb

## 2017-06-04 DIAGNOSIS — IMO0001 Reserved for inherently not codable concepts without codable children: Secondary | ICD-10-CM

## 2017-06-04 DIAGNOSIS — N920 Excessive and frequent menstruation with regular cycle: Secondary | ICD-10-CM | POA: Diagnosis not present

## 2017-06-04 DIAGNOSIS — R81 Glycosuria: Secondary | ICD-10-CM

## 2017-06-04 DIAGNOSIS — E1165 Type 2 diabetes mellitus with hyperglycemia: Secondary | ICD-10-CM | POA: Diagnosis not present

## 2017-06-04 DIAGNOSIS — I10 Essential (primary) hypertension: Secondary | ICD-10-CM | POA: Diagnosis not present

## 2017-06-04 DIAGNOSIS — Z86018 Personal history of other benign neoplasm: Secondary | ICD-10-CM | POA: Diagnosis not present

## 2017-06-04 DIAGNOSIS — R7309 Other abnormal glucose: Secondary | ICD-10-CM | POA: Insufficient documentation

## 2017-06-04 DIAGNOSIS — Z9119 Patient's noncompliance with other medical treatment and regimen: Secondary | ICD-10-CM

## 2017-06-04 DIAGNOSIS — D219 Benign neoplasm of connective and other soft tissue, unspecified: Secondary | ICD-10-CM | POA: Insufficient documentation

## 2017-06-04 DIAGNOSIS — D649 Anemia, unspecified: Secondary | ICD-10-CM | POA: Insufficient documentation

## 2017-06-04 DIAGNOSIS — Z01812 Encounter for preprocedural laboratory examination: Secondary | ICD-10-CM | POA: Insufficient documentation

## 2017-06-04 DIAGNOSIS — Z91199 Patient's noncompliance with other medical treatment and regimen due to unspecified reason: Secondary | ICD-10-CM

## 2017-06-04 HISTORY — DX: Female infertility, unspecified: N97.9

## 2017-06-04 HISTORY — DX: Anxiety disorder, unspecified: F41.9

## 2017-06-04 HISTORY — DX: Depression, unspecified: F32.A

## 2017-06-04 HISTORY — DX: Personal history of other medical treatment: Z92.89

## 2017-06-04 HISTORY — DX: Major depressive disorder, single episode, unspecified: F32.9

## 2017-06-04 LAB — BASIC METABOLIC PANEL
Anion gap: 10 (ref 5–15)
BUN: 6 mg/dL (ref 6–20)
CALCIUM: 9.3 mg/dL (ref 8.9–10.3)
CO2: 28 mmol/L (ref 22–32)
CREATININE: 0.75 mg/dL (ref 0.44–1.00)
Chloride: 101 mmol/L (ref 101–111)
GFR calc Af Amer: >60 mL/min (ref >60)
Glucose, Bld: 197 mg/dL — ABNORMAL HIGH (ref 65–99)
Potassium: 3.4 mmol/L — ABNORMAL LOW (ref 3.5–5.1)
SODIUM: 139 mmol/L (ref 135–145)

## 2017-06-04 LAB — TYPE AND SCREEN
ABO/RH(D): A POS
ANTIBODY SCREEN: NEGATIVE

## 2017-06-04 LAB — POCT URINALYSIS DIP (PROADVANTAGE DEVICE)
BILIRUBIN UA: NEGATIVE mg/dL
Bilirubin, UA: NEGATIVE
Glucose, UA: 250 mg/dL — AB
Leukocytes, UA: NEGATIVE
Nitrite, UA: NEGATIVE
PH UA: 6.5 (ref 5.0–8.0)
PROTEIN UA: NEGATIVE mg/dL
RBC UA: NEGATIVE
SPECIFIC GRAVITY, URINE: 1.02
Urobilinogen, Ur: NEGATIVE

## 2017-06-04 LAB — CBC
HEMATOCRIT: 38.2 % (ref 36.0–46.0)
HEMOGLOBIN: 12.2 g/dL (ref 12.0–15.0)
MCH: 27.2 pg (ref 26.0–34.0)
MCHC: 31.9 g/dL (ref 30.0–36.0)
MCV: 85.3 fL (ref 78.0–100.0)
Platelets: 498 10*3/uL — ABNORMAL HIGH (ref 150–400)
RBC: 4.48 MIL/uL (ref 3.87–5.11)
RDW: 14 % (ref 11.5–15.5)
WBC: 8.6 10*3/uL (ref 4.0–10.5)

## 2017-06-04 LAB — POCT GLYCOSYLATED HEMOGLOBIN (HGB A1C): Hemoglobin A1C: 9.4

## 2017-06-04 MED ORDER — DULAGLUTIDE 0.75 MG/0.5ML ~~LOC~~ SOAJ
1.0000 | SUBCUTANEOUS | 3 refills | Status: DC
Start: 2017-06-04 — End: 2017-06-04

## 2017-06-04 MED ORDER — AMLODIPINE BESYLATE 5 MG PO TABS
5.0000 mg | ORAL_TABLET | Freq: Every day | ORAL | 3 refills | Status: DC
Start: 2017-06-04 — End: 2018-10-09

## 2017-06-04 MED ORDER — DULAGLUTIDE 0.75 MG/0.5ML ~~LOC~~ SOAJ: 1.0000 "application " | SUBCUTANEOUS | 0 refills | Status: DC

## 2017-06-04 MED ORDER — METFORMIN HCL ER 750 MG PO TB24: 750.0000 mg | ORAL_TABLET | Freq: Every day | ORAL | 2 refills | Status: DC

## 2017-06-04 MED FILL — METFORMIN HCL ER 750 MG TAB: 750 | 30 days supply | Qty: 30 | Fill #0

## 2017-06-04 MED FILL — TRULICITY 0.75 MG/0.5 ML PE: 0.75 | 28 days supply | Qty: 2 | Fill #0

## 2017-06-04 MED FILL — AMLODIPINE BESYLATE 5 MG TA: 5 | 90 days supply | Qty: 90 | Fill #0

## 2017-06-04 NOTE — Pre-Procedure Instructions (Signed)
Patient informed to call us back at [phone] if PCP prescribes her an additional BP medication to see if it is ok to take on DOS.

## 2017-06-04 NOTE — Patient Instructions (Addendum)
YOUR BLOOD PRESSURE IS SIGNIFICANTLY ELEVATED TODAY 190/130. Fortunately there are no sings of end organ damage. Continue taking the losartan-HCTZ and add the amlodipine.  Stop the letrozole and call the provider who prescribed this for you. This may be affecting your blood pressure.    CUT BACK ON SALT.    Your hemoglobin A1c is 9.4% today. This is uncontrolled diabetes.  Start Metformin 750 mg once daily with breakfast. Also, we are starting you on a once weekly injection for your diabetes. Take this every Wednesday around 3:30 pm.   Start checking your blood sugars daily every morning fasting (Goal fasting blood sugar 80-130). (Goal blood sugars 2 hour after meals is 130-160) CUT BACK ON SUGARS, CARBOHYDRATES. START WALKING.   Keep a close eye on your blood pressure and keep a record of your readings.   Bring in your blood pressure readings, blood sugar readings and your blood pressure cuff to your appointment next Wednesday.

## 2017-06-04 NOTE — Pre-Procedure Instructions (Signed)
Reviewed medical history, BP and medications with Dr. Candida Peeling. Patient had elevated BP readings at PAT appt.  Large cuff used on lower arms - 194/2121, 176/118 and 198/138.  Patient is on losartan-hctz.  Per Dr. Sabra Heck, patient informed to make appt. with her PCP as soon as possible to address BP.  Patient advised that if her BP on DOS was elevated like at PAT appt., her surgery may be cancelled.  Patient verbalized understanding.    SDS BB History Log sent to Blood bank for patient's previous history of a blood transfusion.

## 2017-06-04 NOTE — Progress Notes (Signed)
Subjective:    Patient ID: Amy Banks, female    DOB: 07-27-1975, 42 y.o.   MRN: [id number]  HPI Chief Complaint  Patient presents with  . other    High Bp   She is here with complaints of elevated HTN for the past month or two. Does not check BP at home. Reports good compliance and no missed doses of Hyzaar. BP was normal in September. Since then, she started on new medication to help with fibroid tumors. She was told in November during a visit in the ED that her BP was very high. She did not follow up with me at that time.  She reports having a diet high in salt and has not been exercising.   She had a history of prediabetes and was diagnosed with type 2 diabetes in September 2018. She was taking Metformin at that time and continued on this. She did not want to start an additional medication, instead she preferred to try and improve her lifestyle to bring down her readings. Since then, she stopped her Metformin, has not been checking her blood sugars as recommended and reports eating a poor diet. Has not been exercising.  Hgb A1c 9.0% 02/13/2017 States she stopped taking her Metformin in October because she feared the medication would interact with other medications.   Denies fever, chills, vision changes, headaches, dizziness, chest pain, palpitations, shortness of breath, abdominal pain, N/V/D, urinary symptoms, LE edema.   Reviewed allergies, medications, past medical, surgical, family, and social history.   Review of Systems Pertinent positives and negatives in the history of present illness.     Objective:   Physical Exam  Constitutional: She is oriented to person, place, and time. She appears well-developed and well-nourished. No distress.  HENT:  Mouth/Throat: Uvula is midline, oropharynx is clear and moist and mucous membranes are normal.  Eyes: Conjunctivae, EOM and lids are normal. Pupils are equal, round, and reactive to light.  Fundi benign   Neck: Normal range  of motion. Neck supple. No JVD present.  Cardiovascular: Normal rate, regular rhythm and intact distal pulses. Exam reveals no gallop and no friction rub.  No murmur heard. No LE edema  Pulmonary/Chest: Effort normal and breath sounds normal.  Musculoskeletal: Normal range of motion.  Lymphadenopathy:    She has no cervical adenopathy.  Neurological: She is alert and oriented to person, place, and time. She has normal strength and normal reflexes. No cranial nerve deficit or sensory deficit. Gait normal.  No hyperreflexia   Skin: Skin is warm and dry. No pallor.  Psychiatric: She has a normal mood and affect. Her speech is normal and behavior is normal. Thought content normal. Cognition and memory are normal.   BP (!) 190/130 (BP Location: Left Arm, Cuff Size: Large)   Pulse 99   Wt (!) 336 lb 6.4 oz (152.6 kg)   LMP 05/25/2017 (Approximate)   SpO2 94%   BMI 54.30 kg/m       Assessment & Plan:  Uncontrolled hypertension - Plan: POCT Urinalysis DIP (Proadvantage Device), EKG 12-Lead, amLODipine (NORVASC) 5 MG tablet  Uncontrolled type 2 diabetes mellitus without complication, without long-term current use of insulin (HCC) - Plan: POCT Urinalysis DIP (Proadvantage Device), POCT glycosylated hemoglobin (Hb A1C), EKG 12-Lead, metFORMIN (GLUCOPHAGE XR) 750 MG 24 hr tablet, Dulaglutide (TRULICITY) 0.34 VQ/2.5ZD SOPN, DISCONTINUED: Dulaglutide (TRULICITY) 6.38 VF/6.4PP SOPN  Morbid obesity (HCC)  Elevated serum glucose with glucosuria - Plan: POCT Urinalysis DIP (Proadvantage Device)  Personal history  of noncompliance with medical treatment, presenting hazards to health  UA pos for glucose, neg ketones, protein  She is scheduled to have surgery, a myomectomy, on 06/13/17 as long as her BP is improving.  Discussed that her BP is severely elevated but fortunately her exam is benign. No sign of end organ damage. She is taking medication, letrozole, for fibroids which may be causing BP  elevation. BP in September was normal and since taking the medication in October she has had elevated BP. Plan to have her stop the medication for now and let her OB/GYN know about this. Also plan to start her on a third medication amlodipine and have her keep a close eye on her BP at home. She will follow up in 1 week or sooner if needed.  EKG unremarkable no sign of strain. Dr. Redmond School reviewed.  She did not follow up for new diagnosis of diabetes and she stopped her Metformin without letting me know.  Hemoglobin A1c 9.4%  She agrees to start back on Metformin and we will also add Trulicity once weekly. Sample box with 2 pens given and she was given the first dose in our office. She was actually able to administer her first dose with instructions. A prescription was sent to her pharmacy.  In depth discussion on potential health consequences associated with uncontrolled HTN and diabetes including heart attack, failure, stroke, etc. She verbalized understanding.  Spent 40 minutes face to face with patient and at least 50% was in counseling and coordination of care. Counseling on diabetes, HTN, weight loss, medication compliance.  I will see her back in 1 week and she will bring in her BP cuff, BP readings and blood sugar readings.

## 2017-06-05 ENCOUNTER — Telehealth: Payer: Self-pay | Admitting: Family Medicine

## 2017-06-05 NOTE — Pre-Procedure Instructions (Signed)
Patient called back last night to let me know that amlodipine was added to help with her BP by her PCP.  Advised patient to take medication with sip of water on day of surgery.

## 2017-06-05 NOTE — Telephone Encounter (Signed)
Pt called and spoke with Amy Banks, she was seen in our office yesterday for bp issues.  Pt was instructed to call surgeon about stopping Letrozole.  Spoke to surgeon's nurse who instructed her not to stop taking the Letrozole.  Says that it will sustain her during surgery and she had a question about taking the Amlodipine with her other bp meds that were given to her yesterday.  Please advise.  Vickie advised for her to adhere to the surgeons advice and continue with Amlodipine and check bp routinely .  Pt advised of same per Amy Banks

## 2017-06-06 ENCOUNTER — Telehealth: Payer: Self-pay | Admitting: Family Medicine

## 2017-06-06 NOTE — Telephone Encounter (Signed)
Pt called and spoke with Amy Banks, she was seen in our office yesterday for bp issues.  Pt was instructed to call surgeon about stopping Letrozole.  Spoke to surgeon's nurse who instructed her not to stop taking the Letrozole.  Says that it will sustain her during surgery and she had a question about taking the Amlodipine with her other bp meds that were given to her yesterday.  Please advise.  Vickie advised for her to adhere to the surgeons advice and continue with Amlodipine and check bp routinely .  Pt advised of same per Amy Banks

## 2017-06-06 NOTE — Telephone Encounter (Signed)
I will also see her back next week.

## 2017-06-11 ENCOUNTER — Encounter: Payer: Self-pay | Admitting: Family Medicine

## 2017-06-11 ENCOUNTER — Ambulatory Visit (INDEPENDENT_AMBULATORY_CARE_PROVIDER_SITE_OTHER): Payer: 59 | Admitting: Family Medicine

## 2017-06-11 VITALS — BP 142/88 | HR 97 | Ht 66.0 in | Wt 327.8 lb

## 2017-06-11 DIAGNOSIS — IMO0001 Reserved for inherently not codable concepts without codable children: Secondary | ICD-10-CM

## 2017-06-11 DIAGNOSIS — D251 Intramural leiomyoma of uterus: Secondary | ICD-10-CM | POA: Diagnosis not present

## 2017-06-11 DIAGNOSIS — N808 Other endometriosis: Secondary | ICD-10-CM | POA: Diagnosis not present

## 2017-06-11 DIAGNOSIS — D259 Leiomyoma of uterus, unspecified: Secondary | ICD-10-CM | POA: Diagnosis not present

## 2017-06-11 DIAGNOSIS — I10 Essential (primary) hypertension: Secondary | ICD-10-CM | POA: Diagnosis not present

## 2017-06-11 DIAGNOSIS — E1165 Type 2 diabetes mellitus with hyperglycemia: Secondary | ICD-10-CM

## 2017-06-11 DIAGNOSIS — E288 Other ovarian dysfunction: Secondary | ICD-10-CM | POA: Diagnosis not present

## 2017-06-11 NOTE — Progress Notes (Signed)
   Subjective:    Patient ID: Amy Banks, female    DOB: May 08, 1976, 41 y.o.   MRN: [id number]  HPI Chief Complaint  Patient presents with  . Follow-up    follow up on blood pressure, and blood sugars   She is here to follow up on uncontrolled HTN and diabetes.  At her last visit we added amlodipine to her blood pressure medication regimen and she appears to be doing well on this.  Blood pressure is much improved. She has not been checking her blood sugars.  She started Trulicity last week and states she gave herself the second dose this morning.  Reports decreased food cravings and increased energy. States she has lost 11 pounds by eating fewer carbs and calories and increasing her physical activity. No new concerns today. She is scheduled for myomectomy this Friday.  Denies fever, chills, dizziness, chest pain, palpitations, shortness of breath, abdominal pain, N/V/D, urinary symptoms, LE edema.   Reviewed allergies, medications, past medical, surgical, family, and social history.   Review of Systems Pertinent positives and negatives in the history of present illness.     Objective:   Physical Exam BP (!) 142/88 (BP Location: Right Arm, Patient Position: Sitting)   Pulse 97   Ht 5\' 6"  (1.676 m)   Wt (!) 327 lb 12.8 oz (148.7 kg)   LMP 05/25/2017 (Approximate)   SpO2 98%   BMI 52.91 kg/m   Alert and oriented in no acute distress.  Not otherwise examined.      Assessment & Plan:  Uncontrolled hypertension  Morbid obesity ([place])  Uncontrolled type 2 diabetes mellitus without complication, without long-term current use of insulin ([place])  Her blood pressure has improved significantly on addition of amlodipine and lifestyle changes. She has cut back on sodium, carbs, sugars and has lost approximately 11 pounds since last week.  She has been more active and is feeling much better. She has not been checking her blood sugars however she reports doing well on metformin and  Trulicity.  No side effects. She is scheduled to have a myomectomy this Friday. Encouraged her to continue keeping a close eye on her blood pressure and to start checking her blood sugars. She will return in 2 months for recheck of hypertension and diabetes.  She is aware that we cannot repeat her A1c until after March 2 but does not want to wait 3 months before she returns.  Plan to keep a close eye on her for now.

## 2017-06-12 ENCOUNTER — Encounter (HOSPITAL_COMMUNITY): Payer: Self-pay

## 2017-06-13 ENCOUNTER — Other Ambulatory Visit: Payer: Self-pay

## 2017-06-13 ENCOUNTER — Ambulatory Visit (HOSPITAL_COMMUNITY): Payer: 59 | Admitting: Anesthesiology

## 2017-06-13 ENCOUNTER — Encounter (HOSPITAL_COMMUNITY)
Admission: AD | Disposition: A | Discharge status: Home / Self Care | Payer: Self-pay | Source: Ambulatory Visit | Attending: Obstetrics and Gynecology

## 2017-06-13 ENCOUNTER — Ambulatory Visit (HOSPITAL_COMMUNITY)
Admission: AD | Admit: 2017-06-13 | Discharge: 2017-06-14 | Disposition: A | Discharge status: Home / Self Care | Payer: 59 | Source: Ambulatory Visit | Attending: Obstetrics and Gynecology | Admitting: Obstetrics and Gynecology

## 2017-06-13 ENCOUNTER — Encounter (HOSPITAL_COMMUNITY): Payer: Self-pay

## 2017-06-13 DIAGNOSIS — N736 Female pelvic peritoneal adhesions (postinfective): Secondary | ICD-10-CM | POA: Diagnosis not present

## 2017-06-13 DIAGNOSIS — F649 Gender identity disorder, unspecified: Secondary | ICD-10-CM | POA: Insufficient documentation

## 2017-06-13 DIAGNOSIS — Z713 Dietary counseling and surveillance: Secondary | ICD-10-CM | POA: Diagnosis not present

## 2017-06-13 DIAGNOSIS — E119 Type 2 diabetes mellitus without complications: Secondary | ICD-10-CM | POA: Diagnosis not present

## 2017-06-13 DIAGNOSIS — N920 Excessive and frequent menstruation with regular cycle: Secondary | ICD-10-CM | POA: Diagnosis not present

## 2017-06-13 DIAGNOSIS — D251 Intramural leiomyoma of uterus: Secondary | ICD-10-CM | POA: Diagnosis not present

## 2017-06-13 DIAGNOSIS — B438 Other forms of chromomycosis: Secondary | ICD-10-CM | POA: Diagnosis not present

## 2017-06-13 DIAGNOSIS — D649 Anemia, unspecified: Secondary | ICD-10-CM | POA: Diagnosis not present

## 2017-06-13 DIAGNOSIS — D219 Benign neoplasm of connective and other soft tissue, unspecified: Secondary | ICD-10-CM | POA: Diagnosis present

## 2017-06-13 DIAGNOSIS — N924 Excessive bleeding in the premenopausal period: Secondary | ICD-10-CM | POA: Diagnosis not present

## 2017-06-13 DIAGNOSIS — F329 Major depressive disorder, single episode, unspecified: Secondary | ICD-10-CM | POA: Diagnosis not present

## 2017-06-13 DIAGNOSIS — E662 Morbid (severe) obesity with alveolar hypoventilation: Secondary | ICD-10-CM | POA: Diagnosis not present

## 2017-06-13 DIAGNOSIS — D259 Leiomyoma of uterus, unspecified: Secondary | ICD-10-CM | POA: Diagnosis not present

## 2017-06-13 HISTORY — PX: ROBOT ASSISTED MYOMECTOMY: SHX5142

## 2017-06-13 HISTORY — PX: LYSIS OF ADHESION: SHX5961

## 2017-06-13 HISTORY — PX: LAPAROTOMY: SHX154

## 2017-06-13 LAB — CBC
HEMATOCRIT: 36 % (ref 36.0–46.0)
HEMOGLOBIN: 11.6 g/dL — AB (ref 12.0–15.0)
MCH: 27.2 pg (ref 26.0–34.0)
MCHC: 32.2 g/dL (ref 30.0–36.0)
MCV: 84.3 fL (ref 78.0–100.0)
Platelets: 475 10*3/uL — ABNORMAL HIGH (ref 150–400)
RBC: 4.27 MIL/uL (ref 3.87–5.11)
RDW: 14 % (ref 11.5–15.5)
WBC: 14.4 10*3/uL — ABNORMAL HIGH (ref 4.0–10.5)

## 2017-06-13 LAB — GLUCOSE, CAPILLARY
GLUCOSE-CAPILLARY: 105 mg/dL — AB (ref 65–99)
Glucose-Capillary: 86 mg/dL (ref 65–99)
Glucose-Capillary: 94 mg/dL (ref 65–99)

## 2017-06-13 SURGERY — ROBOTIC ASSISTED MYOMECTOMY
Anesthesia: General | Site: Abdomen

## 2017-06-13 MED ORDER — HYDROMORPHONE HCL 1 MG/ML IJ SOLN
0.2000 mg | INTRAMUSCULAR | Status: DC | PRN
  Administered 2017-06-13 – 2017-06-14 (×4): 0.6 mg via INTRAVENOUS
  Filled 2017-06-13 (×4): qty 1

## 2017-06-13 MED ORDER — MIDAZOLAM HCL 2 MG/2ML IJ SOLN
INTRAMUSCULAR | Status: DC | PRN
  Administered 2017-06-13: 2 mg via INTRAVENOUS

## 2017-06-13 MED ORDER — LABETALOL HCL 5 MG/ML IV SOLN
INTRAVENOUS | Status: AC
  Filled 2017-06-13: qty 4

## 2017-06-13 MED ORDER — PROPOFOL 10 MG/ML IV BOLUS
INTRAVENOUS | Status: DC | PRN
  Administered 2017-06-13: 100 mg via INTRAVENOUS
  Administered 2017-06-13: 200 mg via INTRAVENOUS

## 2017-06-13 MED ORDER — OXYCODONE HCL 5 MG/5ML PO SOLN: 5.0000 mg | Freq: Once | ORAL | Status: DC | PRN

## 2017-06-13 MED ORDER — FENTANYL CITRATE (PF) 100 MCG/2ML IJ SOLN
INTRAMUSCULAR | Status: AC
  Filled 2017-06-13: qty 2

## 2017-06-13 MED ORDER — SODIUM CHLORIDE 0.9 % IR SOLN
Status: DC | PRN
  Administered 2017-06-13: 3000 mL

## 2017-06-13 MED ORDER — LIDOCAINE HCL (CARDIAC) 20 MG/ML IV SOLN
INTRAVENOUS | Status: DC | PRN
  Administered 2017-06-13: 75 mg via INTRAVENOUS

## 2017-06-13 MED ORDER — OXYCODONE HCL 5 MG PO TABS: 5.0000 mg | ORAL_TABLET | Freq: Once | ORAL | Status: DC | PRN

## 2017-06-13 MED ORDER — ONDANSETRON HCL 4 MG/2ML IJ SOLN: 4.0000 mg | Freq: Four times a day (QID) | INTRAMUSCULAR | Status: DC | PRN

## 2017-06-13 MED ORDER — HYDROMORPHONE HCL 1 MG/ML IJ SOLN
INTRAMUSCULAR | Status: AC
  Administered 2017-06-13: 0.5 mg
  Filled 2017-06-13: qty 1

## 2017-06-13 MED ORDER — HYDRALAZINE HCL 20 MG/ML IJ SOLN
INTRAMUSCULAR | Status: AC
  Filled 2017-06-13: qty 1

## 2017-06-13 MED ORDER — LIDOCAINE HCL (CARDIAC) 20 MG/ML IV SOLN
INTRAVENOUS | Status: AC
  Filled 2017-06-13: qty 5

## 2017-06-13 MED ORDER — BUPIVACAINE-EPINEPHRINE (PF) 0.5% -1:200000 IJ SOLN
INTRAMUSCULAR | Status: DC | PRN
  Administered 2017-06-13: 4.5 mL
  Administered 2017-06-13: 10.5 mL

## 2017-06-13 MED ORDER — OXYCODONE-ACETAMINOPHEN 5-325 MG PO TABS
2.0000 | ORAL_TABLET | ORAL | Status: DC | PRN
  Administered 2017-06-14 (×2): 2 via ORAL
  Filled 2017-06-13 (×2): qty 2

## 2017-06-13 MED ORDER — SUGAMMADEX SODIUM 200 MG/2ML IV SOLN
INTRAVENOUS | Status: DC | PRN
  Administered 2017-06-13: 200 mg via INTRAVENOUS

## 2017-06-13 MED ORDER — POTASSIUM CHLORIDE IN NACL 20-0.9 MEQ/L-% IV SOLN: INTRAVENOUS | Status: DC

## 2017-06-13 MED ORDER — DEXAMETHASONE SODIUM PHOSPHATE 10 MG/ML IJ SOLN
INTRAMUSCULAR | Status: DC | PRN
  Administered 2017-06-13: 4 mg via INTRAVENOUS

## 2017-06-13 MED ORDER — ALUM & MAG HYDROXIDE-SIMETH 200-200-20 MG/5ML PO SUSP
30.0000 mL | ORAL | Status: DC | PRN
  Administered 2017-06-14: 30 mL via ORAL
  Filled 2017-06-13: qty 30

## 2017-06-13 MED ORDER — IBUPROFEN 800 MG PO TABS
800.0000 mg | ORAL_TABLET | Freq: Three times a day (TID) | ORAL | Status: DC | PRN
  Administered 2017-06-14: 800 mg via ORAL
  Filled 2017-06-13: qty 1

## 2017-06-13 MED ORDER — HYDROMORPHONE HCL 1 MG/ML IJ SOLN
INTRAMUSCULAR | Status: AC
  Filled 2017-06-13: qty 1

## 2017-06-13 MED ORDER — HYDROMORPHONE HCL 1 MG/ML IJ SOLN
INTRAMUSCULAR | Status: DC | PRN
  Administered 2017-06-13 (×2): 0.5 mg via INTRAVENOUS

## 2017-06-13 MED ORDER — SUCCINYLCHOLINE CHLORIDE 200 MG/10ML IV SOSY
PREFILLED_SYRINGE | INTRAVENOUS | Status: AC
  Filled 2017-06-13: qty 10

## 2017-06-13 MED ORDER — ONDANSETRON HCL 4 MG/2ML IJ SOLN
INTRAMUSCULAR | Status: DC | PRN
  Administered 2017-06-13: 4 mg via INTRAVENOUS

## 2017-06-13 MED ORDER — HYDROMORPHONE HCL 1 MG/ML IJ SOLN
0.5000 mg | INTRAMUSCULAR | Status: DC | PRN
  Administered 2017-06-13 (×4): 0.5 mg via INTRAVENOUS

## 2017-06-13 MED ORDER — CEFAZOLIN SODIUM-DEXTROSE 2-3 GM-%(50ML) IV SOLR
INTRAVENOUS | Status: AC
  Filled 2017-06-13: qty 50

## 2017-06-13 MED ORDER — PROMETHAZINE HCL 25 MG/ML IJ SOLN: 6.2500 mg | INTRAMUSCULAR | Status: DC | PRN

## 2017-06-13 MED ORDER — LACTATED RINGERS IV SOLN
INTRAVENOUS | Status: DC
  Administered 2017-06-13: 14:00:00 via INTRAVENOUS
  Administered 2017-06-13: 125 mL/h via INTRAVENOUS

## 2017-06-13 MED ORDER — TRAMADOL HCL 50 MG PO TABS: 50.0000 mg | ORAL_TABLET | Freq: Four times a day (QID) | ORAL | Status: DC | PRN

## 2017-06-13 MED ORDER — MIDAZOLAM HCL 2 MG/2ML IJ SOLN
INTRAMUSCULAR | Status: AC
  Filled 2017-06-13: qty 2

## 2017-06-13 MED ORDER — SUGAMMADEX SODIUM 200 MG/2ML IV SOLN
INTRAVENOUS | Status: AC
  Filled 2017-06-13: qty 2

## 2017-06-13 MED ORDER — FENTANYL CITRATE (PF) 100 MCG/2ML IJ SOLN
25.0000 ug | INTRAMUSCULAR | Status: DC | PRN
  Administered 2017-06-13 (×3): 50 ug via INTRAVENOUS

## 2017-06-13 MED ORDER — FENTANYL CITRATE (PF) 250 MCG/5ML IJ SOLN
INTRAMUSCULAR | Status: AC
  Filled 2017-06-13: qty 5

## 2017-06-13 MED ORDER — SODIUM CHLORIDE 0.9 % IJ SOLN
INTRAMUSCULAR | Status: AC
  Filled 2017-06-13: qty 50

## 2017-06-13 MED ORDER — ARTIFICIAL TEARS OPHTHALMIC OINT
TOPICAL_OINTMENT | OPHTHALMIC | Status: AC
  Filled 2017-06-13: qty 3.5

## 2017-06-13 MED ORDER — SCOPOLAMINE 1 MG/3DAYS TD PT72
MEDICATED_PATCH | TRANSDERMAL | Status: AC
  Administered 2017-06-13: 1.5 mg via TRANSDERMAL
  Filled 2017-06-13: qty 1

## 2017-06-13 MED ORDER — VASOPRESSIN 20 UNIT/ML IV SOLN
INTRAVENOUS | Status: DC | PRN
  Administered 2017-06-13: 10 mL via INTRAMUSCULAR
  Administered 2017-06-13: 30 mL via INTRAMUSCULAR

## 2017-06-13 MED ORDER — METHYLENE BLUE 0.5 % INJ SOLN
INTRAVENOUS | Status: DC | PRN
  Administered 2017-06-13: 1 mL

## 2017-06-13 MED ORDER — VASOPRESSIN 20 UNIT/ML IV SOLN
INTRAVENOUS | Status: AC
  Filled 2017-06-13: qty 1

## 2017-06-13 MED ORDER — FENTANYL CITRATE (PF) 100 MCG/2ML IJ SOLN
INTRAMUSCULAR | Status: DC | PRN
  Administered 2017-06-13: 250 ug via INTRAVENOUS
  Administered 2017-06-13 (×4): 50 ug via INTRAVENOUS

## 2017-06-13 MED ORDER — LABETALOL HCL 5 MG/ML IV SOLN
INTRAVENOUS | Status: DC | PRN
  Administered 2017-06-13 (×2): 5 mg via INTRAVENOUS

## 2017-06-13 MED ORDER — ONDANSETRON HCL 4 MG PO TABS: 4.0000 mg | ORAL_TABLET | Freq: Four times a day (QID) | ORAL | Status: DC | PRN

## 2017-06-13 MED ORDER — MENTHOL 3 MG MT LOZG: 1.0000 | LOZENGE | OROMUCOSAL | Status: DC | PRN

## 2017-06-13 MED ORDER — DEXTROSE 5 % IV SOLN
3.0000 g | INTRAVENOUS | Status: AC
  Administered 2017-06-13: 3 g via INTRAVENOUS
  Filled 2017-06-13: qty 3000

## 2017-06-13 MED ORDER — LACTATED RINGERS IV SOLN
INTRAVENOUS | Status: DC | PRN
  Administered 2017-06-13: 13:00:00 via INTRAVENOUS

## 2017-06-13 MED ORDER — SODIUM CHLORIDE 0.9 % IV SOLN
INTRAVENOUS | Status: DC
  Administered 2017-06-13: 20:00:00 via INTRAVENOUS
  Filled 2017-06-13 (×4): qty 1000

## 2017-06-13 MED ORDER — METHYLENE BLUE 0.5 % INJ SOLN
INTRAVENOUS | Status: AC
  Filled 2017-06-13: qty 10

## 2017-06-13 MED ORDER — BUPIVACAINE-EPINEPHRINE (PF) 0.5% -1:200000 IJ SOLN
INTRAMUSCULAR | Status: AC
  Filled 2017-06-13: qty 30

## 2017-06-13 MED ORDER — SUCCINYLCHOLINE CHLORIDE 20 MG/ML IJ SOLN
INTRAMUSCULAR | Status: DC | PRN
  Administered 2017-06-13: 120 mg via INTRAVENOUS

## 2017-06-13 MED ORDER — POLYETHYLENE GLYCOL 3350 17 G PO PACK: 17.0000 g | PACK | Freq: Every day | ORAL | Status: DC | PRN

## 2017-06-13 MED ORDER — ROCURONIUM BROMIDE 100 MG/10ML IV SOLN
INTRAVENOUS | Status: DC | PRN
  Administered 2017-06-13: 10 mg via INTRAVENOUS
  Administered 2017-06-13: 20 mg via INTRAVENOUS
  Administered 2017-06-13: 5 mg via INTRAVENOUS
  Administered 2017-06-13: 10 mg via INTRAVENOUS
  Administered 2017-06-13: 45 mg via INTRAVENOUS
  Administered 2017-06-13: 10 mg via INTRAVENOUS

## 2017-06-13 MED ORDER — SCOPOLAMINE 1 MG/3DAYS TD PT72
1.0000 | MEDICATED_PATCH | Freq: Once | TRANSDERMAL | Status: DC
  Administered 2017-06-13: 1.5 mg via TRANSDERMAL

## 2017-06-13 MED ORDER — PROPOFOL 10 MG/ML IV BOLUS
INTRAVENOUS | Status: AC
  Filled 2017-06-13: qty 20

## 2017-06-13 MED ORDER — ROCURONIUM BROMIDE 100 MG/10ML IV SOLN
INTRAVENOUS | Status: AC
  Filled 2017-06-13: qty 2

## 2017-06-13 SURGICAL SUPPLY — 67 items
BARRIER ADHS 3X4 INTERCEED (GAUZE/BANDAGES/DRESSINGS) IMPLANT
CATH ROBINSON RED A/P 16FR (CATHETERS) IMPLANT
CONT PATH 16OZ SNAP LID 3702 (MISCELLANEOUS) ×3 IMPLANT
COVER BACK TABLE 60X90IN (DRAPES) ×6 IMPLANT
COVER TIP SHEARS 8 DVNC (MISCELLANEOUS) ×1 IMPLANT
COVER TIP SHEARS 8MM DA VINCI (MISCELLANEOUS) ×2
DECANTER SPIKE VIAL GLASS SM (MISCELLANEOUS) ×12 IMPLANT
DEFOGGER SCOPE WARMER CLEARIFY (MISCELLANEOUS) ×3 IMPLANT
DERMABOND ADVANCED (GAUZE/BANDAGES/DRESSINGS) ×4
DERMABOND ADVANCED .7 DNX12 (GAUZE/BANDAGES/DRESSINGS) ×2 IMPLANT
DEVICE TROCAR PUNCTURE CLOSURE (ENDOMECHANICALS) IMPLANT
DRSG OPSITE POSTOP 3X4 (GAUZE/BANDAGES/DRESSINGS) ×3 IMPLANT
DRSG OPSITE POSTOP 4X10 (GAUZE/BANDAGES/DRESSINGS) ×3 IMPLANT
DURAPREP 26ML APPLICATOR (WOUND CARE) ×3 IMPLANT
ELECT BLADE 6.5 EXT (BLADE) ×3 IMPLANT
ELECT REM PT RETURN 9FT ADLT (ELECTROSURGICAL) ×3
ELECTRODE REM PT RTRN 9FT ADLT (ELECTROSURGICAL) ×1 IMPLANT
FILTER STRAW FLUID ASPIR (MISCELLANEOUS) ×3 IMPLANT
GLOVE BIO SURGEON STRL SZ8 (GLOVE) ×9 IMPLANT
GLOVE BIOGEL PI IND STRL 7.0 (GLOVE) ×1 IMPLANT
GLOVE BIOGEL PI IND STRL 8.5 (GLOVE) ×1 IMPLANT
GLOVE BIOGEL PI INDICATOR 7.0 (GLOVE) ×2
GLOVE BIOGEL PI INDICATOR 8.5 (GLOVE) ×2
KIT ABG SYR 3ML LUER SLIP (SYRINGE) ×3 IMPLANT
KIT ACCESSORY DA VINCI DISP (KITS) ×2
KIT ACCESSORY DVNC DISP (KITS) ×1 IMPLANT
LEGGING LITHOTOMY PAIR STRL (DRAPES) ×3 IMPLANT
MANIPULATOR UTERINE 4.5 ZUMI (MISCELLANEOUS) ×6 IMPLANT
MARKER SKIN DUAL TIP RULER LAB (MISCELLANEOUS) ×3 IMPLANT
NEEDLE INSUFFLATION 120MM (ENDOMECHANICALS) ×3 IMPLANT
NEEDLE SPNL 22GX7 QUINCKE BK (NEEDLE) ×6 IMPLANT
PACK ROBOT WH (CUSTOM PROCEDURE TRAY) ×3 IMPLANT
PACK ROBOTIC GOWN (GOWN DISPOSABLE) ×3 IMPLANT
PACK TRENDGUARD 450 HYBRID PRO (MISCELLANEOUS) IMPLANT
PACK TRENDGUARD 600 HYBRD PROC (MISCELLANEOUS) ×1 IMPLANT
PAD PREP 24X48 CUFFED NSTRL (MISCELLANEOUS) ×3 IMPLANT
PROTECTOR NERVE ULNAR (MISCELLANEOUS) ×6 IMPLANT
SEPRAFILM MEMBRANE 5X6 (MISCELLANEOUS) ×6 IMPLANT
SET CYSTO W/LG BORE CLAMP LF (SET/KITS/TRAYS/PACK) IMPLANT
SET IRRIG TUBING LAPAROSCOPIC (IRRIGATION / IRRIGATOR) ×3 IMPLANT
SET TRI-LUMEN FLTR TB AIRSEAL (TUBING) IMPLANT
SUT MNCRL AB 4-0 PS2 18 (SUTURE) ×3 IMPLANT
SUT VIC AB 0 CT1 27 (SUTURE) ×8
SUT VIC AB 0 CT1 27XBRD ANBCTR (SUTURE) ×4 IMPLANT
SUT VIC AB 2-0 CT1 27 (SUTURE) ×8
SUT VIC AB 2-0 CT1 TAPERPNT 27 (SUTURE) ×4 IMPLANT
SUT VIC AB 2-0 CT2 27 (SUTURE) IMPLANT
SUT VIC AB 4-0 SH 27 (SUTURE)
SUT VIC AB 4-0 SH 27XANBCTRL (SUTURE) IMPLANT
SUT VICRYL 0 UR6 27IN ABS (SUTURE) ×3 IMPLANT
SUT VLOC 180 2-0 9IN GS21 (SUTURE) IMPLANT
SUT VLOC 180 3-0 9IN GS21 (SUTURE) IMPLANT
SYR 50ML SLIP (SYRINGE) IMPLANT
SYR TOOMEY 50ML (SYRINGE) ×3 IMPLANT
SYS LAPSCP GELPORT 120MM (MISCELLANEOUS) ×3
SYSTEM CONVERTIBLE TROCAR (TROCAR) IMPLANT
SYSTEM LAPSCP GELPORT 120MM (MISCELLANEOUS) ×1 IMPLANT
TOWEL OR 17X24 6PK STRL BLUE (TOWEL DISPOSABLE) ×9 IMPLANT
TRAY FOLEY CATH SILVER 16FR (SET/KITS/TRAYS/PACK) ×3 IMPLANT
TRENDGUARD 450 HYBRID PRO PACK (MISCELLANEOUS)
TRENDGUARD 600 HYBRID PROC PK (MISCELLANEOUS) ×3
TROCAR 12M 150ML BLUNT (TROCAR) ×3 IMPLANT
TROCAR DILATING TIP 12MM 150MM (ENDOMECHANICALS) ×3 IMPLANT
TROCAR DISP BLADELESS 8 DVNC (TROCAR) ×1 IMPLANT
TROCAR DISP BLADELESS 8MM (TROCAR) ×2
TROCAR PORT AIRSEAL 5X120 (TROCAR) IMPLANT
TROCAR PORT AIRSEAL 8X120 (TROCAR) ×3 IMPLANT

## 2017-06-13 NOTE — Anesthesia Procedure Notes (Signed)
Procedure Name: Intubation Performed by: Sandrea Matte, CRNA Pre-anesthesia Checklist: Patient identified, Emergency Drugs available and Suction available Patient Re-evaluated:Patient Re-evaluated prior to induction Oxygen Delivery Method: Circle system utilized Preoxygenation: Pre-oxygenation with 100% oxygen Induction Type: IV induction Ventilation: Mask ventilation without difficulty Laryngoscope Size: Miller and 2 Grade View: Grade III Tube type: Oral Number of attempts: 2 Airway Equipment and Method: Patient positioned with wedge pillow,  Stylet and Oral airway Placement Confirmation: ETT inserted through vocal cords under direct vision,  positive ETCO2 and breath sounds checked- equal and bilateral Secured at: 23 cm Tube secured with: Tape Dental Injury: Teeth and Oropharynx as per pre-operative assessment  Difficulty Due To: Difficulty was anticipated Comments: Due to thick neck/torso.

## 2017-06-13 NOTE — OR Nursing (Signed)
Pt still waiting for Dr Lorenza Chick a little "ancy" and c/o being thirsty.  Initial CBG 105 on arrival-currently 86mg /dL but asymptomatic.   Dr. Fransisco Beau notified and told to just monitor for next hour. Myra, Chasitiy and Merrilee Seashore at bedside apologizing to pt and husband for the delay in surgery.  Explained to pt and husband current ETA and how sorry they were pt was so delayed. Pt and husband verbalized an understanding.

## 2017-06-13 NOTE — H&P (Signed)
Amy Banks is a 42 y.o. female , originally referred to me by Dr. Azucena Banks , for robotic assisted myomectomy.  She was diagnosed with fibroids because of abnormal uterine bleeding and was placed on oral contraceptives.  She developed breakthrough bleeding and had to stop the pills.  She has been having monthly periods but with heavy flow and prolonged duration. Upon review of pelvic ultrasound, she was reffered for an MRI which noted an enlarged uterus measuring a total of 20.4 x 11.5 x 13.8 cm. A large dominant partially degenerated anterior fibroid causing displacement; which measures 11.4 x 8 x 8.8 cm. Patient would like to preserve her childbearing potential.  Pertinent Gynecological History: Menses: flow is excessive with use of 3 pads or tampons on heaviest days Bleeding: dysfunctional uterine bleeding Contraception: none DES exposure: denies Blood transfusions: none Sexually transmitted diseases: no past history Previous GYN Procedures: myomectomy  Last mammogram: normal Last pap: normal  OB History: G0P0   Menstrual History: Menarche age: 3 No LMP recorded.    Past Medical History:  Diagnosis Date  . Anemia   . Anxiety   . Depression   . Diabetes mellitus without complication ([place])   . Diverticulitis 08/2013  . Fibroids    tx w/ hormone meds  . History of blood transfusion 03/2016   at [facility]  . Hypertension   . Infertility, female    on hormone meds  . Morbid obesity ([place])                     Past Surgical History:  Procedure Laterality Date  . BREAST SURGERY  2001   reduction  . LAPAROTOMY Right 01/02/2015   Procedure: EXPLORATORY LAPAROTOMY WITH RIGHT OVARIAN CYSTECTOMY;  Surgeon: Marylynn Pearson, MD;  Location: [facility];  Service: Gynecology;  Laterality: Right;  . LYSIS OF ADHESION N/A 01/02/2015   Procedure: LYSIS OF ADHESION;  Surgeon: Marylynn Pearson, MD;  Location: [facility];  Service: Gynecology;  Laterality: N/A;  . MYOMECTOMY  2009              Family History  Problem Relation Age of Onset  . Diabetes Mother   . Hypertension Mother   . Diabetes Sister   . Diabetes Maternal Grandmother   . Hypertension Maternal Grandmother   . Hypertension Unknown   . Diabetes Unknown   . Breast cancer Unknown   . Ovarian cancer Unknown   . Cancer Maternal Aunt    No hereditary disease.  No cancer of breast, ovary, uterus. No cutaneous leiomyomatosis or renal cell carcinoma.  Social History   Socioeconomic History  . Marital status: Married    Spouse name: Not on file  . Number of children: Not on file  . Years of education: Not on file  . Highest education level: Not on file  Social Needs  . Financial resource strain: Not on file  . Food insecurity - worry: Not on file  . Food insecurity - inability: Not on file  . Transportation needs - medical: Not on file  . Transportation needs - non-medical: Not on file  Occupational History  . Not on file  Tobacco Use  . Smoking status: Never Smoker  . Smokeless tobacco: Never Used  Substance and Sexual Activity  . Alcohol use: No  . Drug use: No  . Sexual activity: Yes    Birth control/protection: None  Other Topics Concern  . 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 Codeine Hives and Itching    No current facility-administered medications on file prior to encounter.    Current Outpatient Medications on File Prior to Encounter  Medication Sig Dispense Refill  . ALPRAZolam (XANAX) 0.25 MG tablet Take 1 tablet (0.25 mg total) by mouth 2 (two) times daily as needed for anxiety. 12 tablet 0  . danazol (DANOCRINE) 200 MG capsule Take 200 mg by mouth 2 (two) times daily. Alternates days and takes Letrozole 1 day and Danazol the next  11  . ferrous sulfate 325 (65 FE) MG EC tablet Take 325 mg by mouth  daily with breakfast.    . ibuprofen (ADVIL,MOTRIN) 200 MG tablet Take 200-800 mg by mouth every 4 (four) hours as needed for moderate pain or cramping (depends on pain level if takes 2-4 tablets).    Marland Kitchen letrozole (FEMARA) 2.5 MG tablet Take 2.5 mg by mouth daily. Alternates days and takes Letrozole 1 day and Danazol the next  11  . losartan-hydrochlorothiazide (HYZAAR) 100-12.5 MG tablet TAKE 1 TABLET BY MOUTH DAILY. 30 tablet 5     Review of Systems  Constitutional: Negative.   HENT: Negative.   Eyes: Negative.   Respiratory: Negative.   Cardiovascular: Negative.   Gastrointestinal: Negative.   Genitourinary: Negative.   Musculoskeletal: Negative.   Skin: Negative.   Neurological: Negative.   Endo/Heme/Allergies: Negative.   Psychiatric/Behavioral: Negative.      Physical Exam  BP (!) 151/84   Pulse (!) 105   Temp 98.6 F (37 C) (Oral)   Resp 16   LMP 05/25/2017 (Approximate)   SpO2 100%  Constitutional: She is oriented to person, place, and time. She appears well-developed and well-nourished.  HENT:  Head: Normocephalic and atraumatic.  Nose: Nose normal.  Mouth/Throat: Oropharynx is clear and moist. No oropharyngeal exudate.  Eyes: Conjunctivae normal and EOM are normal. Pupils are equal, round, and reactive to light. No scleral icterus.  Neck: Normal range of motion. Neck supple. No tracheal deviation present. No thyromegaly present.  Cardiovascular: Normal rate.   Respiratory: Effort normal and breath sounds normal.  GI: Soft. Bowel sounds are normal. She exhibits no distension and no mass. There is no tenderness.  Lymphadenopathy:    She has no cervical adenopathy.  Neurological: She is alert and oriented to person, place, and time. She has normal reflexes.  Skin: Skin is warm.  Psychiatric: She has a normal mood and affect. Her behavior is normal. Judgment and thought content normal.       Assessment/Plan: Large uterine myoma, causing menorrhagia and pressure  sensation. Preoperative for robot assisted myomectomy Benefits and risks of robotic myomectomy were discussed with the patient and her family member again.  Bowel prep instructions were given.  All of patient's questions were answered.  She verbalized understanding.  She knows that she will need a cesarean delivery for future pregnancies, and that it is recommended she does not conceive for 2-3 months for uterus to heal.

## 2017-06-13 NOTE — Transfer of Care (Signed)
Immediate Anesthesia Transfer of Care Note  Patient: KIMIYE STRATHMAN  Procedure(s) Performed: ATTEMPTED ROBOTIC ASSISTED MYOMECTOMY (N/A Abdomen) LYSIS OF ADHESION (N/A Abdomen) MINI LAPAROTOMY (N/A Abdomen)  Patient Location: PACU  Anesthesia Type:General  Level of Consciousness: awake, oriented, drowsy and patient cooperative  Airway & Oxygen Therapy: Patient Spontanous Breathing and Patient connected to face mask oxygen  Post-op Assessment: Report given to RN, Post -op Vital signs reviewed and stable and Patient moving all extremities X 4  Post vital signs: Reviewed and stable  Last Vitals:  Vitals:   06/13/17 0820  BP: (!) 151/84  Pulse: (!) 105  Resp: 16  Temp: 37 C  SpO2: 100%    Last Pain:  Vitals:   06/13/17 0820  TempSrc: Oral      Patients Stated Pain Goal: 3 (91/47/82 9562)  Complications: No apparent anesthesia complications

## 2017-06-13 NOTE — Anesthesia Preprocedure Evaluation (Addendum)
Anesthesia Evaluation  Patient identified by MRN, date of birth, ID band Patient awake    Reviewed: Allergy & Precautions, NPO status , Patient's Chart, lab work & pertinent test results  History of Anesthesia Complications Negative for: history of anesthetic complications  Airway Mallampati: II  TM Distance: >3 FB Neck ROM: Full    Dental no notable dental hx. (+) Dental Advisory Given   Pulmonary neg pulmonary ROS,    breath sounds clear to auscultation       Cardiovascular hypertension, Pt. on medications  Rhythm:Regular Rate:Normal     Neuro/Psych Anxiety Depression negative neurological ROS     GI/Hepatic negative GI ROS, Neg liver ROS,   Endo/Other  diabetes, Type 2, Oral Hypoglycemic AgentsMorbid obesity  Renal/GU negative Renal ROS  negative genitourinary   Musculoskeletal negative musculoskeletal ROS (+)   Abdominal (+) + obese,   Peds negative pediatric ROS (+)  Hematology  (+) anemia ,   Anesthesia Other Findings   Reproductive/Obstetrics Fibroid uterus                            Anesthesia Physical  Anesthesia Plan  ASA: III  Anesthesia Plan: General   Post-op Pain Management:    Induction: Intravenous  PONV Risk Score and Plan: 4 or greater and Scopolamine patch - Pre-op, Ondansetron, Dexamethasone, Midazolam and Treatment may vary due to age or medical condition  Airway Management Planned: Oral ETT  Additional Equipment: None  Intra-op Plan:   Post-operative Plan: Extubation in OR  Informed Consent: I have reviewed the patients History and Physical, chart, labs and discussed the procedure including the risks, benefits and alternatives for the proposed anesthesia with the patient or authorized representative who has indicated his/her understanding and acceptance.   Dental advisory given  Plan Discussed with: CRNA  Anesthesia Plan Comments: (Will have  glidescope in room as backup. Ramp to be utilized for proper positioning for intubation.)       Anesthesia Quick Evaluation

## 2017-06-13 NOTE — Op Note (Signed)
OPERATIVE NOTE   Preoperative diagnosis:  Recurrent large uterine myoma, menorrhagia   Postoperative diagnosis:  Recurrent large uterine myoma, pelvic adhesions, menorrhagia   Procedure: Myomectomy and lysis of adhesions via attempted robotic assisted laparoscopy, converted to minilaparotomy, chromotubation  Anesthesia: Gen. endotracheal   Surgeon: Governor Specking   Assistant: Leana Roe RNFA  Complications: None   Estimated blood loss: 100 mL  Specimens: Myoma to pathology.   Findings: On exam under anesthesia, external genitalia, Bartholin's, Skene's, urethra and vagina were normal. The cervix appeared grossly normal. The uterus sounded to 17 cm. No posterior fornix nodularity was palpable. The uterus was enlarged to 19-20 gestational weeks size with a single large myoma and it was firm, immobile. The fundus was at the umbilicus. On laparoscopy, liver edge and diaphragm surfaces appeared normal. The uterus was enlarged with a large 11 x 9 cm anterior transmural myoma and appeared globular. The myometrium surrounding the large myoma was hyperplastic. In addition the degenerating myoma had regions that weren't tightly attached to the surrounding normal myometrium, probably due to patient's history of previous myomectomy.  There were anterior cul-de-sac filmy adhesions from previous myomectomy. There was limited view of the posterior cul-de-sac due to adhesions in this region.  The right tube and ovary could be visualized. The left adnexa could not be identified due to cohesive adhesions involving this area.  Posterior cul-de-sac could not be visualized due to adhesions.  Description of the procedure: Patient was placed in lithotomy position and general endotracheal anesthesia was given.  3 g of cefazolin were given intravenously for prophylaxis. She was prepped and draped in sterile manner. A Foley catheter was inserted into the bladder. A ZUMI uterine manipulator was inserted into the uterus  for uterine manipulation and chromotubation. The uterus sounded to 17 cm.   An operative field was created on the abdomen and the surgeon was regloved. After preemptive anesthesia with quarter percent bupivacaine and 1:200,000 epinephrine, and supraumbilical skin incision was made 5 cm above umbilicus. A Verress needle was inserted and pneumoperitoneum was created with carbon dioxide. A 12 mm trocar was placed at this incision. Robotic laparoscope with 3-D camera was inserted and, under direct visualization, one left lateral 100mm incision was made and corresponding robotic trochar was placed. On the right side a third robotic trocar was placed as well as a 10 mm assistant port. The da Vinci SI robot was side-docked to the patient after placing her in Trendelenburg position. The surgeon continued the rest of the procedure from the surgical console.  A dilute vasopressin solution (0.33 units per milliliter) was injected transcutaneously into the myometrium of the uterus. Using monopolar robotic scissors at a setting of 55 W cutting current, an elliptical incision was made on the anterior transmural myoma, and myoma dissection was started. Large size of the myoma and anterior cul-de-sac and posterior uterine adhesions precluded adequate visualization of the myoma therefore we decided to add a GelPort for manual manipulation of the surgical area. Initially a 4 cm skin incision was made about 5 cm above the symphysis pubis transversely. It was carried down to fascia by blunt and sharp dissection. The rectus sheath was nicked and incised in transverse elliptical manner. Dense adhesions were encountered hue to previous laparotomy. The rectus sheath was separated from underlying rectus muscles by blunt and sharp dissection, again encountering dense adhesions. The rectus muscles were separated at midline by blunt and sharp dissection. The peritoneum was nicked and incised in vertical manner. After inserting the GelPort this  proved to be too small for the size of the myoma therefore the incision was gradually extended to 8 cm.  The myoma dissection continued through this minilaparotomy incision. The anterior cul-de-sac filmy adhesions were lysed by blunt and sharp dissection. The left sided posterior cul-de-sac adhesions were started to be lysed but no further attempt was made because of the cohesive nature of these adhesions.  The myoma was in situ morcellated because of its large size. The entire myoma could be separated. We also noted that there was a 0.5 cm fibrous shell of the myoma remaining behind and and this shell was also peeled off by blunt and sharp dissection. The resulting myometrial defect was closed in 4 layers. The first 3 layers were 2-0 Vicryl continuous sutures incorporating various depths of the deep defect in the myometrium. The fourth layer  was 2-0 Vicryl continuous suture to approximate the serosa and superficial myometrium.  At this point we undocked the robot and continued the procedure through the minilaparotomy site. We performed chromotubation with dilute methylene blue solution and did not see any mass of the dye from the uterine cavity. Hemostasis was insured.At this point the procedure was terminated. Instrument and lap pad count was correct.  Pelvis was copiously irrigated with lactated Ringer solution and a slurry of Seprafilm (2 sheets in 50 mL of lactated Ringer solution) was instilled into the pelvis as an adhesion barrier, just before the last suture was placed on the minilaparotomy fascial closure using 0 Vicryl continuous suture. The subcutaneous tissue was irrigated with lactated Ringer solution and hemostasis was insured. The skin incision was approximated with 4-0 Monocryl in subcuticular stitches. The skin layer of the supra-umbilical incision was approximated with 4-0 Monocryl in subcuticular stitches. The  rest of the skin incisions were approximated with Dermabond.   The patient  tolerated the procedure well and was transferred to recovery room in satisfactory condition.  Special Note: Due to to significant myometrial incisions, the patient is recommended to have cesarean delivery with future pregnancies.  Governor Specking, MD

## 2017-06-13 NOTE — Anesthesia Procedure Notes (Addendum)
Procedure Name: Intubation Date/Time: 06/13/2017 1:07 PM Performed by: Audry Pili, MD Pre-anesthesia Checklist: Patient identified, Emergency Drugs available, Suction available and Patient being monitored Patient Re-evaluated:Patient Re-evaluated prior to induction Oxygen Delivery Method: Circle system utilized Preoxygenation: Pre-oxygenation with 100% oxygen Induction Type: IV induction Ventilation: Mask ventilation without difficulty Laryngoscope Size: Miller and 2 Grade View: Grade II Tube type: Oral Tube size: 7.5 mm Number of attempts: 2 (First attempt by CRNA with Mac 3, grade 3 view. Second attempt by Dr. Fransisco Beau, with Mil 2, grade 2a view) Airway Equipment and Method: Stylet and Oral airway Placement Confirmation: ETT inserted through vocal cords under direct vision,  positive ETCO2 and breath sounds checked- equal and bilateral Secured at: 23 cm Tube secured with: Tape Dental Injury: Teeth and Oropharynx as per pre-operative assessment

## 2017-06-14 ENCOUNTER — Encounter (HOSPITAL_COMMUNITY): Payer: Self-pay | Admitting: Obstetrics and Gynecology

## 2017-06-14 DIAGNOSIS — B438 Other forms of chromomycosis: Secondary | ICD-10-CM | POA: Diagnosis not present

## 2017-06-14 DIAGNOSIS — D219 Benign neoplasm of connective and other soft tissue, unspecified: Secondary | ICD-10-CM | POA: Diagnosis not present

## 2017-06-14 DIAGNOSIS — Z713 Dietary counseling and surveillance: Secondary | ICD-10-CM | POA: Diagnosis not present

## 2017-06-14 DIAGNOSIS — E662 Morbid (severe) obesity with alveolar hypoventilation: Secondary | ICD-10-CM | POA: Diagnosis not present

## 2017-06-14 DIAGNOSIS — F329 Major depressive disorder, single episode, unspecified: Secondary | ICD-10-CM | POA: Diagnosis not present

## 2017-06-14 DIAGNOSIS — D259 Leiomyoma of uterus, unspecified: Secondary | ICD-10-CM | POA: Diagnosis not present

## 2017-06-14 DIAGNOSIS — F649 Gender identity disorder, unspecified: Secondary | ICD-10-CM | POA: Diagnosis not present

## 2017-06-14 DIAGNOSIS — E119 Type 2 diabetes mellitus without complications: Secondary | ICD-10-CM | POA: Diagnosis not present

## 2017-06-14 LAB — GLUCOSE, CAPILLARY: GLUCOSE-CAPILLARY: 142 mg/dL — AB (ref 65–99)

## 2017-06-14 NOTE — Progress Notes (Signed)
Discharge instructions reviewed with patient.  Patient states understanding of home care, medications, activity, signs/symptoms to report to MD and return MD office visit.  Patients significant other and family will assist with her care @ home.  No home  equipment needed, patient has prescriptions and all personal belongings.  Patient discharged via wheelchair in stable condition with staff without incident.  

## 2017-06-14 NOTE — Anesthesia Postprocedure Evaluation (Signed)
Anesthesia Post Note  Patient: MURIAL BEAM  Procedure(s) Performed: ATTEMPTED ROBOTIC ASSISTED MYOMECTOMY (N/A Abdomen) LYSIS OF ADHESION (N/A Abdomen) MINI LAPAROTOMY (N/A Abdomen)     Patient location during evaluation: PACU Anesthesia Type: General Level of consciousness: awake and alert Pain management: pain level controlled Vital Signs Assessment: post-procedure vital signs reviewed and stable Respiratory status: spontaneous breathing, nonlabored ventilation, respiratory function stable and patient connected to nasal cannula oxygen Cardiovascular status: blood pressure returned to baseline and stable Postop Assessment: no apparent nausea or vomiting Anesthetic complications: no    Last Vitals:  Vitals:   06/14/17 0009 06/14/17 0420  BP: 126/70 128/70  Pulse: 88 87  Resp: 18 18  Temp: 37.5 C 37 C  SpO2: 97% 96%    Last Pain:  Vitals:   06/14/17 0605  TempSrc:   PainSc: Asleep   Pain Goal: Patients Stated Pain Goal: 3 (06/14/17 0505)               Audry Pili

## 2017-06-16 LAB — GLUCOSE, CAPILLARY: GLUCOSE-CAPILLARY: 144 mg/dL — AB (ref 65–99)

## 2017-06-20 MED FILL — LOSARTAN-HCTZ 100-12.5 MG T: 100-12.5 | 30 days supply | Qty: 30 | Fill #2

## 2017-06-30 DIAGNOSIS — D251 Intramural leiomyoma of uterus: Secondary | ICD-10-CM | POA: Diagnosis not present

## 2017-06-30 DIAGNOSIS — E288 Other ovarian dysfunction: Secondary | ICD-10-CM | POA: Diagnosis not present

## 2017-06-30 DIAGNOSIS — Z319 Encounter for procreative management, unspecified: Secondary | ICD-10-CM | POA: Diagnosis not present

## 2017-07-01 MED FILL — MENOPUR 75 UNIT VIAL: 75 | 20 days supply | Qty: 20 | Fill #0

## 2017-07-01 MED FILL — CETROTIDE 0.25 MG KIT: 0.25 | 5 days supply | Qty: 5 | Fill #0

## 2017-07-01 MED FILL — ESTRADIOL 0.1 MG PATCH: 0.1 | 28 days supply | Qty: 8 | Fill #0

## 2017-07-01 MED FILL — DOXYCYCLINE HYCLATE 100 MG: 100 | 20 days supply | Qty: 40 | Fill #0

## 2017-07-01 MED FILL — BD 3 ML SYRINGE 18GX1-1/2": 18G X 1-1/2 | 30 days supply | Qty: 60 | Fill #0

## 2017-07-01 MED FILL — BD NEEDLES 30GX0.5": 30G X 1/2" | 25 days supply | Qty: 25 | Fill #0

## 2017-07-01 MED FILL — GONAL-F 1,050 UNITS VIAL: 1050 | 10 days supply | Qty: 3 | Fill #0

## 2017-07-01 MED FILL — BD NEEDLES 22GX1.5": 22G X 1-1/2 | 30 days supply | Qty: 30 | Fill #0

## 2017-07-01 MED FILL — SHARPS COLLECTOR 1.4QT: 30 days supply | Qty: 1 | Fill #0

## 2017-07-01 MED FILL — METHYLPREDNISOLONE 16 MG TA: 16 | 4 days supply | Qty: 4 | Fill #0

## 2017-07-01 MED FILL — PREGNYL 10,000 UNITS VIAL: 10000 | 1 days supply | Qty: 1 | Fill #0

## 2017-07-01 MED FILL — BD 3 ML SYRINGE 18GX1-1/2: 18G X 1-1/2 | 30 days supply | Qty: 60 | Fill #0

## 2017-07-01 MED FILL — BD NEEDLES 30GX0.5: 30G X 1/2" | 25 days supply | Qty: 25 | Fill #0

## 2017-07-01 MED FILL — BD NEEDLES 22GX1.5: 22G X 1-1/2 | 30 days supply | Qty: 30 | Fill #0

## 2017-07-02 MED FILL — TRULICITY 0.75 MG/0.5 ML PE: 0.75 | 28 days supply | Qty: 2 | Fill #1

## 2017-07-02 MED FILL — METFORMIN HCL ER 750 MG TAB: 750 | 30 days supply | Qty: 30 | Fill #1

## 2017-07-09 DIAGNOSIS — Z3183 Encounter for assisted reproductive fertility procedure cycle: Secondary | ICD-10-CM | POA: Diagnosis not present

## 2017-07-10 ENCOUNTER — Institutional Professional Consult (permissible substitution): Payer: 59 | Admitting: Family Medicine

## 2017-07-16 MED FILL — TRANEXAMIC ACID 650 MG TAB: 650 | 5 days supply | Qty: 15 | Fill #0

## 2017-07-22 MED FILL — LOSARTAN-HCTZ 100-12.5 MG T: 100-12.5 | 30 days supply | Qty: 30 | Fill #3

## 2017-08-10 NOTE — Progress Notes (Deleted)
  Subjective:    Patient ID: Amy Banks, female    DOB: Sep 18, 1975, 42 y.o.   MRN: [id number]  Amy Banks is a 42 y.o. female who presents for follow-up of Type 2 diabetes mellitus.  Patient {is/are not:32546} checking home blood sugars.   Home blood sugar records: {dm home sugars:14018} How often is blood sugars being checked: *** Current symptoms include: {dm sx:14075}. Patient denies {dm sx:19199}.  Patient {is/are not:32546} checking their feet daily. Any Foot concerns (callous, ulcer, wound, thickened nails, toenail fungus, skin fungus, hammer toe): *** Last dilated eye exam: ***  Current treatments: {dm interventions:14074}. Medication compliance: {good/fair/poor:33178}  Current diet: {diet habits:16563} Current exercise: {exercise types:16438} Known diabetic complications: {diabetes complications:1215}  The following portions of the patient's history were reviewed and updated as appropriate: allergies, current medications, past medical history, past social history and problem list.  ROS as in subjective above.     Objective:    Physical Exam Alert and in no distress otherwise not examined.  There were no vitals taken for this visit.  Lab Review Diabetic Labs Latest Ref Rng & Units 06/04/2017 02/13/2017 09/06/2016 01/03/2015 12/26/2014  HbA1c - 9.4 9.0% - - -  Microalbumin mg/dL - 1.7 - - -  Micro/Creat Ratio <30 mcg/mg creat - 8 - - -  Creatinine 0.44 - 1.00 mg/dL 0.75 0.85 0.78 0.94 0.82   BP/Weight 06/14/2017 06/13/2017 06/11/2017 06/09/7937 0/08/90  Systolic BP 330 - 076 226 333  Diastolic BP 77 - 88 545 625  Wt. (Lbs) - 327 327.8 336.4 334.25  BMI - 52.78 52.91 54.3 53.95   Foot/eye exam completion dates 02/13/2017  Foot Form Completion Done    [name]  reports that  has never smoked. she has never used smokeless tobacco. She reports that she does not drink alcohol or use drugs.     Assessment & Plan:    Uncontrolled type 2 diabetes mellitus without  complication, without long-term current use of insulin ([place])  1. Rx changes: {none:33079} 2. Education: Reviewed 'ABCs' of diabetes management (respective goals in parentheses):  A1C (<7), blood pressure (<130/80), and cholesterol (LDL <100). 3. Compliance at present is estimated to be {good/fair/poor:33178}. Efforts to improve compliance (if necessary) will be directed at {compliance:16716}. 4. Follow up: {NUMBERS; 0-10:33138} {time:11}

## 2017-08-11 ENCOUNTER — Ambulatory Visit: Payer: 59 | Admitting: Family Medicine

## 2017-08-11 MED FILL — TRULICITY 0.75 MG/0.5 ML PE: 0.75 | 28 days supply | Qty: 2 | Fill #2

## 2017-08-11 MED FILL — METFORMIN HCL ER 750 MG TAB: 750 | 30 days supply | Qty: 30 | Fill #2

## 2017-08-20 ENCOUNTER — Ambulatory Visit: Payer: 59 | Admitting: Family Medicine

## 2017-08-20 ENCOUNTER — Encounter: Payer: Self-pay | Admitting: Family Medicine

## 2017-08-20 VITALS — BP 132/84 | HR 92 | Wt 315.2 lb

## 2017-08-20 DIAGNOSIS — E1165 Type 2 diabetes mellitus with hyperglycemia: Secondary | ICD-10-CM | POA: Diagnosis not present

## 2017-08-20 DIAGNOSIS — I10 Essential (primary) hypertension: Secondary | ICD-10-CM

## 2017-08-20 DIAGNOSIS — IMO0001 Reserved for inherently not codable concepts without codable children: Secondary | ICD-10-CM

## 2017-08-20 MED ORDER — DULAGLUTIDE 0.75 MG/0.5ML ~~LOC~~ SOAJ: 1.0000 "application " | SUBCUTANEOUS | 2 refills | Status: DC

## 2017-08-20 NOTE — Progress Notes (Signed)
  Subjective:    Patient ID: Amy Banks, female    DOB: 1976-02-20, 42 y.o.   MRN: [id number]  Chief Complaint  Patient presents with  . follow-up    follow-up on DM and HTN. not checking sugars    Amy Banks is a 42 y.o. female who presents for follow-up of Type 2 diabetes mellitus and HTN.  This is her 2 month follow up due to uncontrolled HTN and DM and recent surgery.    Patient is not checking home blood sugars.   Home blood sugar records: patient does not check sugars How often is blood sugars being checked: none  Current symptoms include: none. Patient denies nausea, polydipsia, polyuria, visual disturbances and vomiting.  Patient is checking their feet daily. Any Foot concerns (callous, ulcer, wound, thickened nails, toenail fungus, skin fungus, hammer toe): none Last dilated eye exam: unknown  Current treatments: Metformin and Trulicity. Medication compliance: good  Current diet: improving and cutting back on carbohydrates Current exercise: none Known diabetic complications: none  The following portions of the patient's history were reviewed and updated as appropriate: allergies, current medications, past medical history, past social history and problem list.  ROS as in subjective above.     Objective:    Physical Exam Alert and in no distress. Cardiac exam shows a regular rhythm without murmurs or gallops. Lungs are clear to auscultation. Extremities without edema.    Blood pressure 132/84, pulse 92, weight (!) 315 lb 3.2 oz (143 kg).  Lab Review Diabetic Labs Latest Ref Rng & Units 06/04/2017 02/13/2017 09/06/2016 01/03/2015 12/26/2014  HbA1c - 9.4 9.0% - - -  Microalbumin mg/dL - 1.7 - - -  Micro/Creat Ratio <30 mcg/mg creat - 8 - - -  Creatinine 0.44 - 1.00 mg/dL 0.75 0.85 0.78 0.94 0.82   BP/Weight 08/20/2017 06/14/2017 06/13/2017 08/05/3297 07/07/2681  Systolic BP 419 622 - 297 989  Diastolic BP 84 77 - 88 211  Wt. (Lbs) 315.2 - 327 327.8 336.4  BMI 50.87  - 52.78 52.91 54.3   Foot/eye exam completion dates 02/13/2017  Foot Form Completion Done    Relda  reports that  has never smoked. she has never used smokeless tobacco. She reports that she does not drink alcohol or use drugs.     Assessment & Plan:    Uncontrolled type 2 diabetes mellitus without complication, without long-term current use of insulin ([facility]) - Plan: Dulaglutide (TRULICITY) 9.41 DE/0.8XK SOPN  Essential hypertension  Morbid obesity ([place])  1. Rx changes: none Hgb A1c is not due. POCT random glucose 147  2. Education: Reviewed 'ABCs' of diabetes management (respective goals in parentheses):  A1C (<7), blood pressure (<130/80), and cholesterol (LDL <100). 3. Compliance at present is estimated to be good. Efforts to improve compliance (if necessary) will be directed at dietary modifications: limit carbs to 45-60 for meals and 15 for snacks. limit sodium intake, increased exercise and regular blood sugar monitoring: weekly. 4. BP is not being checked at home. Closer to goal range. Continue on medications.  5. Cholesterol- no fasting lipid panel. She will return for fasting lipids next month. Advised of need for statin based on diabetes and increased risk of heart disease. She would like to hold off for now.  6. Follow up: 1 month for fasting diabetes check.

## 2017-08-20 NOTE — Patient Instructions (Signed)
Try to limit your carbohydrates between 45-60 for each meal and 15 for snacks.  Limit sodium to 2,000-2,4000 mg daily.   Check your fasting blood sugar at least once weekly. Goal range is 80-130. Goal range 2 hours after a meal is 130-160.   Goal blood pressure is <130/80.   Your BP today is 138/84. This is closer to goal.    DASH Eating Plan DASH stands for "Dietary Approaches to Stop Hypertension." The DASH eating plan is a healthy eating plan that has been shown to reduce high blood pressure (hypertension). It may also reduce your risk for type 2 diabetes, heart disease, and stroke. The DASH eating plan may also help with weight loss. What are tips for following this plan? General guidelines  Avoid eating more than 2,300 mg (milligrams) of salt (sodium) a day. If you have hypertension, you may need to reduce your sodium intake to 1,500 mg a day.  Limit alcohol intake to no more than 1 drink a day for nonpregnant women and 2 drinks a day for men. One drink equals 12 oz of beer, 5 oz of wine, or 1 oz of hard liquor.  Work with your health care provider to maintain a healthy body weight or to lose weight. Ask what an ideal weight is for you.  Get at least 30 minutes of exercise that causes your heart to beat faster (aerobic exercise) most days of the week. Activities may include walking, swimming, or biking.  Work with your health care provider or diet and nutrition specialist (dietitian) to adjust your eating plan to your individual calorie needs. Reading food labels  Check food labels for the amount of sodium per serving. Choose foods with less than 5 percent of the Daily Value of sodium. Generally, foods with less than 300 mg of sodium per serving fit into this eating plan.  To find whole grains, look for the word "whole" as the first word in the ingredient list. Shopping  Buy products labeled as "low-sodium" or "no salt added."  Buy fresh foods. Avoid canned foods and premade or  frozen meals. Cooking  Avoid adding salt when cooking. Use salt-free seasonings or herbs instead of table salt or sea salt. Check with your health care provider or pharmacist before using salt substitutes.  Do not fry foods. Cook foods using healthy methods such as baking, boiling, grilling, and broiling instead.  Cook with heart-healthy oils, such as olive, canola, soybean, or sunflower oil. Meal planning   Eat a balanced diet that includes: ? 5 or more servings of fruits and vegetables each day. At each meal, try to fill half of your plate with fruits and vegetables. ? Up to 6-8 servings of whole grains each day. ? Less than 6 oz of lean meat, poultry, or fish each day. A 3-oz serving of meat is about the same size as a deck of cards. One egg equals 1 oz. ? 2 servings of low-fat dairy each day. ? A serving of nuts, seeds, or beans 5 times each week. ? Heart-healthy fats. Healthy fats called Omega-3 fatty acids are found in foods such as flaxseeds and coldwater fish, like sardines, salmon, and mackerel.  Limit how much you eat of the following: ? Canned or prepackaged foods. ? Food that is high in trans fat, such as fried foods. ? Food that is high in saturated fat, such as fatty meat. ? Sweets, desserts, sugary drinks, and other foods with added sugar. ? Full-fat dairy products.  Do  not salt foods before eating.  Try to eat at least 2 vegetarian meals each week.  Eat more home-cooked food and less restaurant, buffet, and fast food.  When eating at a restaurant, ask that your food be prepared with less salt or no salt, if possible. What foods are recommended? The items listed may not be a complete list. Talk with your dietitian about what dietary choices are best for you. Grains Whole-grain or whole-wheat bread. Whole-grain or whole-wheat pasta. Brown rice. [place]. Bulgur. Whole-grain and low-sodium cereals. Pita bread. Low-fat, low-sodium crackers. Whole-wheat flour  tortillas. Vegetables Fresh or frozen vegetables (raw, steamed, roasted, or grilled). Low-sodium or reduced-sodium tomato and vegetable juice. Low-sodium or reduced-sodium tomato sauce and tomato paste. Low-sodium or reduced-sodium canned vegetables. Fruits All fresh, dried, or frozen fruit. Canned fruit in natural juice (without added sugar). Meat and other protein foods Skinless chicken or [place]. Ground chicken or [place]. Pork with fat trimmed off. Fish and seafood. Egg whites. Dried beans, peas, or lentils. Unsalted nuts, nut butters, and seeds. Unsalted canned beans. Lean cuts of beef with fat trimmed off. Low-sodium, lean deli meat. Dairy Low-fat (1%) or fat-free (skim) milk. Fat-free, low-fat, or reduced-fat cheeses. Nonfat, low-sodium ricotta or cottage cheese. Low-fat or nonfat yogurt. Low-fat, low-sodium cheese. Fats and oils Soft margarine without trans fats. Vegetable oil. Low-fat, reduced-fat, or light mayonnaise and salad dressings (reduced-sodium). Canola, safflower, olive, soybean, and sunflower oils. Avocado. Seasoning and other foods Herbs. Spices. Seasoning mixes without salt. Unsalted popcorn and pretzels. Fat-free sweets. What foods are not recommended? The items listed may not be a complete list. Talk with your dietitian about what dietary choices are best for you. Grains Baked goods made with fat, such as croissants, muffins, or some breads. Dry pasta or rice meal packs. Vegetables Creamed or fried vegetables. Vegetables in a cheese sauce. Regular canned vegetables (not low-sodium or reduced-sodium). Regular canned tomato sauce and paste (not low-sodium or reduced-sodium). Regular tomato and vegetable juice (not low-sodium or reduced-sodium). Angie Fava. Olives. Fruits Canned fruit in a light or heavy syrup. Fried fruit. Fruit in cream or butter sauce. Meat and other protein foods Fatty cuts of meat. Ribs. Fried meat. Berniece Salines. Sausage. Bologna and other processed lunch meats.  Salami. Fatback. Hotdogs. Bratwurst. Salted nuts and seeds. Canned beans with added salt. Canned or smoked fish. Whole eggs or egg yolks. Chicken or [place] with skin. Dairy Whole or 2% milk, cream, and half-and-half. Whole or full-fat cream cheese. Whole-fat or sweetened yogurt. Full-fat cheese. Nondairy creamers. Whipped toppings. Processed cheese and cheese spreads. Fats and oils Butter. Stick margarine. Lard. Shortening. Ghee. Bacon fat. Tropical oils, such as coconut, palm kernel, or palm oil. Seasoning and other foods Salted popcorn and pretzels. Onion salt, garlic salt, seasoned salt, table salt, and sea salt. Worcestershire sauce. Tartar sauce. Barbecue sauce. Teriyaki sauce. Soy sauce, including reduced-sodium. Steak sauce. Canned and packaged gravies. Fish sauce. Oyster sauce. Cocktail sauce. Horseradish that you find on the shelf. Ketchup. Mustard. Meat flavorings and tenderizers. Bouillon cubes. Hot sauce and Tabasco sauce. Premade or packaged marinades. Premade or packaged taco seasonings. Relishes. Regular salad dressings. Where to find more information:  National Heart, Lung, and [place]: https://wilson-eaton.com/  American Heart Association: www.heart.org Summary  The DASH eating plan is a healthy eating plan that has been shown to reduce high blood pressure (hypertension). It may also reduce your risk for type 2 diabetes, heart disease, and stroke.  With the DASH eating plan, you should limit salt (sodium) intake  to 2,300 mg a day. If you have hypertension, you may need to reduce your sodium intake to 1,500 mg a day.  When on the DASH eating plan, aim to eat more fresh fruits and vegetables, whole grains, lean proteins, low-fat dairy, and heart-healthy fats.  Work with your health care provider or diet and nutrition specialist (dietitian) to adjust your eating plan to your individual calorie needs. This information is not intended to replace advice given to you by your health  care provider. Make sure you discuss any questions you have with your health care provider. Document Released: 05/09/2011 Document Revised: 05/13/2016 Document Reviewed: 05/13/2016 Elsevier Interactive Patient Education  [employer].

## 2017-08-26 MED FILL — LOSARTAN-HCTZ 100-12.5 MG T: 100-12.5 | 30 days supply | Qty: 30 | Fill #4

## 2017-09-01 MED FILL — AMLODIPINE BESYLATE 5 MG TA: 5 | 90 days supply | Qty: 90 | Fill #1

## 2017-09-03 MED FILL — TRULICITY 0.75 MG/0.5 ML PE: 0.75 | 28 days supply | Qty: 2 | Fill #3

## 2017-09-03 MED FILL — NORETHINDRONE 5 MG TABLET: 5 | 60 days supply | Qty: 30 | Fill #0

## 2017-09-11 ENCOUNTER — Other Ambulatory Visit: Payer: Self-pay | Admitting: Family Medicine

## 2017-09-11 DIAGNOSIS — IMO0001 Reserved for inherently not codable concepts without codable children: Secondary | ICD-10-CM

## 2017-09-11 DIAGNOSIS — E1165 Type 2 diabetes mellitus with hyperglycemia: Principal | ICD-10-CM

## 2017-09-11 MED FILL — METFORMIN HCL ER 750 MG TAB: 750 | 30 days supply | Qty: 30 | Fill #0

## 2017-09-11 NOTE — Telephone Encounter (Signed)
Pt has an appt 4/25

## 2017-09-23 MED FILL — NITROFURANTOIN MONO-MCR 100: 100 | 5 days supply | Qty: 10 | Fill #0

## 2017-09-23 MED FILL — PHENAZOPYRIDINE 200 MG TAB: 200 | 2 days supply | Qty: 6 | Fill #0

## 2017-09-25 ENCOUNTER — Ambulatory Visit: Payer: 59 | Admitting: Family Medicine

## 2017-10-02 ENCOUNTER — Other Ambulatory Visit: Payer: Self-pay | Admitting: Family Medicine

## 2017-10-02 DIAGNOSIS — E1165 Type 2 diabetes mellitus with hyperglycemia: Principal | ICD-10-CM

## 2017-10-02 DIAGNOSIS — IMO0001 Reserved for inherently not codable concepts without codable children: Secondary | ICD-10-CM

## 2017-10-02 MED FILL — LOSARTAN-HCTZ 100-12.5 MG T: 100-12.5 | 30 days supply | Qty: 30 | Fill #5

## 2017-10-02 MED FILL — TRULICITY 0.75 MG/0.5 ML PE: 0.75 | 28 days supply | Qty: 2 | Fill #0

## 2017-10-23 MED FILL — METFORMIN HCL ER 750 MG TAB: 750 | 30 days supply | Qty: 30 | Fill #1

## 2017-11-10 DIAGNOSIS — Z319 Encounter for procreative management, unspecified: Secondary | ICD-10-CM | POA: Diagnosis not present

## 2017-11-17 ENCOUNTER — Other Ambulatory Visit: Payer: Self-pay | Admitting: Family Medicine

## 2017-11-17 MED FILL — LOSARTAN-HCTZ 100-12.5 MG T: 100-12.5 | 30 days supply | Qty: 30 | Fill #0

## 2017-11-20 MED FILL — LETROZOLE 2.5 MG TABLET: 2.5 | 5 days supply | Qty: 15 | Fill #0

## 2017-12-03 ENCOUNTER — Other Ambulatory Visit: Payer: Self-pay | Admitting: Family Medicine

## 2017-12-03 MED FILL — ALPRAZolam 0.25 MG TABS: 0.25 | 6 days supply | Qty: 12 | Fill #0

## 2017-12-03 MED FILL — METFORMIN HCL ER 750 MG TAB: 750 | 30 days supply | Qty: 30 | Fill #2

## 2017-12-03 NOTE — Telephone Encounter (Signed)
Is this okay to refill? 

## 2017-12-05 MED FILL — OVIDREL 250 MCG/0.5 ML SYRG: 250 | 30 days supply | Qty: 1 | Fill #0

## 2018-01-12 MED FILL — LOSARTAN-HCTZ 100-12.5 MG T: 100-12.5 | 30 days supply | Qty: 30 | Fill #1

## 2018-01-21 DIAGNOSIS — Z319 Encounter for procreative management, unspecified: Secondary | ICD-10-CM | POA: Diagnosis not present

## 2018-02-16 ENCOUNTER — Other Ambulatory Visit: Payer: Self-pay | Admitting: Family Medicine

## 2018-02-16 DIAGNOSIS — IMO0001 Reserved for inherently not codable concepts without codable children: Secondary | ICD-10-CM

## 2018-02-16 DIAGNOSIS — E1165 Type 2 diabetes mellitus with hyperglycemia: Principal | ICD-10-CM

## 2018-02-16 MED FILL — METFORMIN HCL ER 750 MG TAB: 750 | 30 days supply | Qty: 30 | Fill #0

## 2018-02-16 MED FILL — AMLODIPINE BESYLATE 5 MG TA: 5 | 30 days supply | Qty: 30 | Fill #2

## 2018-03-09 ENCOUNTER — Telehealth: Payer: Self-pay | Admitting: Internal Medicine

## 2018-03-09 NOTE — Telephone Encounter (Signed)
APPT NEEDED FOR DM BEFORE ANYMORE REFILLS WILL BE GIVEN>>>   CALLED AND LEFT MESSAGE FOR PT TO CALL BACK AND SCHEDULE

## 2018-03-10 MED FILL — LOSARTAN-HCTZ 100-12.5 MG T: 100-12.5 | 30 days supply | Qty: 30 | Fill #2

## 2018-04-15 DIAGNOSIS — Z1231 Encounter for screening mammogram for malignant neoplasm of breast: Secondary | ICD-10-CM | POA: Diagnosis not present

## 2018-04-28 ENCOUNTER — Other Ambulatory Visit: Payer: Self-pay | Admitting: Family Medicine

## 2018-04-28 MED FILL — LOSARTAN-HCTZ 100-12.5 MG T: 100-12.5 | 30 days supply | Qty: 30 | Fill #0

## 2018-04-28 MED FILL — AMLODIPINE BESYLATE 5 MG TA: 5 | 30 days supply | Qty: 30 | Fill #3

## 2018-04-28 MED FILL — METFORMIN HCL ER 750 MG TAB: 750 | 30 days supply | Qty: 30 | Fill #1

## 2018-04-28 NOTE — Telephone Encounter (Signed)
Called and left message for pt to call back and schedule an APPT as she is OVERDUE. Will refill for only 30 days

## 2018-07-28 DIAGNOSIS — M25531 Pain in right wrist: Secondary | ICD-10-CM | POA: Diagnosis not present

## 2018-07-28 DIAGNOSIS — S6991XA Unspecified injury of right wrist, hand and finger(s), initial encounter: Secondary | ICD-10-CM | POA: Diagnosis not present

## 2018-10-07 ENCOUNTER — Other Ambulatory Visit: Payer: Self-pay | Admitting: Family Medicine

## 2018-10-09 ENCOUNTER — Ambulatory Visit (INDEPENDENT_AMBULATORY_CARE_PROVIDER_SITE_OTHER): Payer: BLUE CROSS/BLUE SHIELD | Admitting: Family Medicine

## 2018-10-09 ENCOUNTER — Encounter: Payer: Self-pay | Admitting: Family Medicine

## 2018-10-09 ENCOUNTER — Other Ambulatory Visit: Payer: Self-pay

## 2018-10-09 VITALS — BP 171/115 | Temp 97.0°F | Resp 16 | Wt 325.0 lb

## 2018-10-09 DIAGNOSIS — I10 Essential (primary) hypertension: Secondary | ICD-10-CM

## 2018-10-09 DIAGNOSIS — Z9119 Patient's noncompliance with other medical treatment and regimen: Secondary | ICD-10-CM

## 2018-10-09 DIAGNOSIS — R350 Frequency of micturition: Secondary | ICD-10-CM

## 2018-10-09 DIAGNOSIS — F4310 Post-traumatic stress disorder, unspecified: Secondary | ICD-10-CM | POA: Diagnosis not present

## 2018-10-09 DIAGNOSIS — F32A Depression, unspecified: Secondary | ICD-10-CM

## 2018-10-09 DIAGNOSIS — F329 Major depressive disorder, single episode, unspecified: Secondary | ICD-10-CM

## 2018-10-09 DIAGNOSIS — E1165 Type 2 diabetes mellitus with hyperglycemia: Secondary | ICD-10-CM

## 2018-10-09 DIAGNOSIS — Z915 Personal history of self-harm: Secondary | ICD-10-CM

## 2018-10-09 DIAGNOSIS — F419 Anxiety disorder, unspecified: Secondary | ICD-10-CM

## 2018-10-09 DIAGNOSIS — Z91199 Patient's noncompliance with other medical treatment and regimen due to unspecified reason: Secondary | ICD-10-CM

## 2018-10-09 DIAGNOSIS — IMO0001 Reserved for inherently not codable concepts without codable children: Secondary | ICD-10-CM

## 2018-10-09 DIAGNOSIS — Z9151 Personal history of suicidal behavior: Secondary | ICD-10-CM

## 2018-10-09 LAB — GLUCOSE, POCT (MANUAL RESULT ENTRY): POC Glucose: 249 mg/dl — AB (ref 70–99)

## 2018-10-09 LAB — POCT URINALYSIS DIP (PROADVANTAGE DEVICE)
Bilirubin, UA: NEGATIVE
Blood, UA: NEGATIVE
Glucose, UA: >=1000 mg/dL — AB
Ketones, POC UA: NEGATIVE mg/dL
Leukocytes, UA: NEGATIVE
Nitrite, UA: NEGATIVE
Protein Ur, POC: NEGATIVE mg/dL
Specific Gravity, Urine: 1.025
Urobilinogen, Ur: NEGATIVE
pH, UA: 6 (ref 5.0–8.0)

## 2018-10-09 MED ORDER — AMLODIPINE BESYLATE 5 MG PO TABS: 5.0000 mg | ORAL_TABLET | Freq: Every day | ORAL | 2 refills | Status: DC

## 2018-10-09 MED ORDER — GLUCOSE BLOOD VI STRP: ORAL_STRIP | 12 refills | Status: DC

## 2018-10-09 MED ORDER — LOSARTAN POTASSIUM-HCTZ 100-12.5 MG PO TABS: 1.0000 | ORAL_TABLET | Freq: Every day | ORAL | 2 refills | Status: DC

## 2018-10-09 MED ORDER — MICROLET LANCETS MISC: 1 refills | Status: DC
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
## 2018-10-09 NOTE — Patient Instructions (Signed)
I refilled your blood pressure medication.  Please start back on these and keep an eye on your blood pressure at home.  The goal range for your blood pressure is less than 130/80.  I would like for you to eventually get to this range over the next few weeks.  Cut back on foods high in sodium and start walking more.  Start checking your blood sugars at least once if not twice daily.  We will call you with your lab results and most likely you will need to start back on medications to control your blood sugars. Goal fasting blood sugar is 80-130.  2 hours after a meal the goal blood sugar is between 130 and 160.  Cut back on sweets, sugary drinks, carbohydrates such as pasta, bread, potatoes, rice.  I recommend that you establish with a psychiatrist and a therapist.  If you have any more thoughts of hurting yourself then please go to the [facility] long the emergency department as we discussed.  We will contact you with your lab results.  You can call to schedule your appointment with the psychiatrist/counselor. A few offices are listed below for you to call.    [facility] for a psychiatrist  [place]  (across from [facility])  [facility] [address] [place], [place] [address] [facility]  [address], [place], [place], [place] [address]  Phone: [phone]   [facility] [address] [place], [place] [address]  Phone: [phone]

## 2018-10-10 LAB — LIPID PANEL
Chol/HDL Ratio: 5.3 ratio — ABNORMAL HIGH (ref 0.0–4.4)
Cholesterol, Total: 203 mg/dL — ABNORMAL HIGH (ref 100–199)
HDL: 38 mg/dL — ABNORMAL LOW (ref >39)
LDL Calculated: 142 mg/dL — ABNORMAL HIGH (ref 0–99)
Triglycerides: 114 mg/dL (ref 0–149)
VLDL Cholesterol Cal: 23 mg/dL (ref 5–40)

## 2018-10-10 LAB — CBC WITH DIFFERENTIAL/PLATELET
Basophils Absolute: 0 10*3/uL (ref 0.0–0.2)
Basos: 0 %
EOS (ABSOLUTE): 0.1 10*3/uL (ref 0.0–0.4)
Eos: 1 %
Hematocrit: 41.8 % (ref 34.0–46.6)
Hemoglobin: 13.3 g/dL (ref 11.1–15.9)
Immature Grans (Abs): 0 10*3/uL (ref 0.0–0.1)
Immature Granulocytes: 0 %
Lymphocytes Absolute: 3.6 10*3/uL — ABNORMAL HIGH (ref 0.7–3.1)
Lymphs: 36 %
MCH: 25.7 pg — ABNORMAL LOW (ref 26.6–33.0)
MCHC: 31.8 g/dL (ref 31.5–35.7)
MCV: 81 fL (ref 79–97)
Monocytes Absolute: 0.5 10*3/uL (ref 0.1–0.9)
Monocytes: 5 %
Neutrophils Absolute: 5.8 10*3/uL (ref 1.4–7.0)
Neutrophils: 58 %
Platelets: 393 10*3/uL (ref 150–450)
RBC: 5.18 x10E6/uL (ref 3.77–5.28)
RDW: 15 % (ref 11.7–15.4)
WBC: 10 10*3/uL (ref 3.4–10.8)

## 2018-10-10 LAB — COMPREHENSIVE METABOLIC PANEL
ALT: 14 IU/L (ref 0–32)
AST: 14 IU/L (ref 0–40)
Albumin/Globulin Ratio: 1.3 (ref 1.2–2.2)
Albumin: 4.5 g/dL (ref 3.8–4.8)
Alkaline Phosphatase: 127 IU/L — ABNORMAL HIGH (ref 39–117)
BUN/Creatinine Ratio: 9 (ref 9–23)
BUN: 6 mg/dL (ref 6–24)
Bilirubin Total: 0.3 mg/dL (ref 0.0–1.2)
CO2: 20 mmol/L (ref 20–29)
Calcium: 10 mg/dL (ref 8.7–10.2)
Chloride: 99 mmol/L (ref 96–106)
Creatinine, Ser: 0.67 mg/dL (ref 0.57–1.00)
GFR calc Af Amer: 126 mL/min/{1.73_m2} (ref >59)
GFR calc non Af Amer: 110 mL/min/{1.73_m2} (ref >59)
Globulin, Total: 3.6 g/dL (ref 1.5–4.5)
Glucose: 227 mg/dL — ABNORMAL HIGH (ref 65–99)
Potassium: 3.6 mmol/L (ref 3.5–5.2)
Sodium: 139 mmol/L (ref 134–144)
Total Protein: 8.1 g/dL (ref 6.0–8.5)

## 2018-10-10 LAB — HEMOGLOBIN A1C
Est. average glucose Bld gHb Est-mCnc: 269 mg/dL
Hgb A1c MFr Bld: 11 % — ABNORMAL HIGH (ref 4.8–5.6)

## 2018-10-10 LAB — T4, FREE: Free T4: 1.24 ng/dL (ref 0.82–1.77)

## 2018-10-10 LAB — TSH: TSH: 2.16 u[IU]/mL (ref 0.450–4.500)

## 2018-10-12 ENCOUNTER — Other Ambulatory Visit: Payer: Self-pay | Admitting: Internal Medicine

## 2018-10-12 DIAGNOSIS — IMO0001 Reserved for inherently not codable concepts without codable children: Secondary | ICD-10-CM

## 2018-10-12 MED ORDER — METFORMIN HCL ER 750 MG PO TB24: 750.0000 mg | ORAL_TABLET | Freq: Every day | ORAL | 2 refills | Status: DC

## 2018-10-14 ENCOUNTER — Encounter: Payer: Self-pay | Admitting: Family Medicine

## 2018-10-14 ENCOUNTER — Ambulatory Visit: Payer: BLUE CROSS/BLUE SHIELD | Admitting: Family Medicine

## 2018-10-14 ENCOUNTER — Other Ambulatory Visit: Payer: Self-pay

## 2018-10-14 VITALS — BP 120/82 | HR 87 | Temp 97.5°F | Ht 66.0 in | Wt 321.4 lb

## 2018-10-14 DIAGNOSIS — I10 Essential (primary) hypertension: Secondary | ICD-10-CM | POA: Diagnosis not present

## 2018-10-14 DIAGNOSIS — F419 Anxiety disorder, unspecified: Secondary | ICD-10-CM | POA: Diagnosis not present

## 2018-10-14 DIAGNOSIS — IMO0001 Reserved for inherently not codable concepts without codable children: Secondary | ICD-10-CM

## 2018-10-14 DIAGNOSIS — F32A Depression, unspecified: Secondary | ICD-10-CM

## 2018-10-14 DIAGNOSIS — F329 Major depressive disorder, single episode, unspecified: Secondary | ICD-10-CM

## 2018-10-14 DIAGNOSIS — E1165 Type 2 diabetes mellitus with hyperglycemia: Secondary | ICD-10-CM

## 2018-10-14 MED ORDER — DULAGLUTIDE 0.75 MG/0.5ML ~~LOC~~ SOAJ: 1.0000 "application " | SUBCUTANEOUS | 3 refills | Status: DC

## 2018-10-14 NOTE — Patient Instructions (Addendum)
Check your blood sugars 1-2 times daily and keep a record. Bring these in to your follow up visit in 3 months. Come in sooner if your readings are not in goal range.  Fasting blood sugar goal 80-130 and 2 hours after a meal 130-160.  Your blood pressure today is normal at 120/82  Talk with your OB/GYN before becoming pregnant since some of your medications may not be safe in pregnancy.   Follow up here in 3 months for diabetes and blood pressure follow up.     Dulaglutide injection What is this medicine? DULAGLUTIDE (DOO la GLOO tide) is used to improve blood sugar control in adults with type 2 diabetes. This medicine may be used with other oral diabetes medicines. This medicine may be used for other purposes; ask your health care provider or pharmacist if you have questions. COMMON BRAND NAME(S): TRULICITY What should I tell my health care provider before I take this medicine? They need to know if you have any of these conditions: -endocrine tumors (MEN 2) or if someone in your family had these tumors -history of pancreatitis -kidney disease -liver disease -stomach problems -thyroid cancer or if someone in your family had thyroid cancer -an unusual or allergic reaction to dulaglutide, other medicines, foods, dyes, or preservatives -pregnant or trying to get pregnant -breast-feeding How should I use this medicine? This medicine is for injection under the skin of your upper leg (thigh), stomach area, or upper arm. It is usually given once every week (every 7 days). You will be taught how to prepare and give this medicine. Use exactly as directed. Take your medicine at regular intervals. Do not take it more often than directed. If you use this medicine with insulin, you should inject this medicine and the insulin separately. Do not mix them together. Do not give the injections right next to each other. Change (rotate) injection sites with each injection. It is important that you put your  used needles and syringes in a special sharps container. Do not put them in a trash can. If you do not have a sharps container, call your pharmacist or healthcare provider to get one. A special MedGuide will be given to you by the pharmacist with each prescription and refill. Be sure to read this information carefully each time. Talk to your pediatrician regarding the use of this medicine in children. Special care may be needed. Overdosage: If you think you have taken too much of this medicine contact a poison control center or emergency room at once. NOTE: This medicine is only for you. Do not share this medicine with others. What if I miss a dose? If you miss a dose, take it as soon as you can within 3 days after the missed dose. Then take your next dose at your regular weekly time. If it has been longer than 3 days after the missed dose, do not take the missed dose. Take the next dose at your regular time. Do not take double or extra doses. If you have questions about a missed dose, contact your health care provider for advice. What may interact with this medicine? -other medicines for diabetes Many medications may cause changes in blood sugar, these include: -alcohol containing beverages -antiviral medicines for HIV or AIDS -aspirin and aspirin-like drugs -certain medicines for blood pressure, heart disease, irregular heart beat -chromium -diuretics -female hormones, such as estrogens or progestins, birth control pills -fenofibrate -gemfibrozil -isoniazid -lanreotide -female hormones or anabolic steroids -MAOIs like Carbex, Eldepryl, Marplan,  Nardil, and [place]ate -medicines for weight loss -medicines for allergies, asthma, cold, or cough -medicines for depression, anxiety, or psychotic disturbances -niacin -nicotine -NSAIDs, medicines for pain and inflammation, like ibuprofen or naproxen -octreotide -pasireotide -pentamidine -phenytoin -probenecid -quinolone antibiotics such as  ciprofloxacin, levofloxacin, ofloxacin -some herbal dietary supplements -steroid medicines such as prednisone or cortisone -sulfamethoxazole; trimethoprim -thyroid hormones Some medications can hide the warning symptoms of low blood sugar (hypoglycemia). You may need to monitor your blood sugar more closely if you are taking one of these medications. These include: -beta-blockers, often used for high blood pressure or heart problems (examples include atenolol, metoprolol, propranolol) -clonidine -guanethidine -reserpine This list may not describe all possible interactions. Give your health care provider a list of all the medicines, herbs, non-prescription drugs, or dietary supplements you use. Also tell them if you smoke, drink alcohol, or use illegal drugs. Some items may interact with your medicine. What should I watch for while using this medicine? Visit your doctor or health care professional for regular checks on your progress. Drink plenty of fluids while taking this medicine. Check with your doctor or health care professional if you get an attack of severe diarrhea, nausea, and vomiting. The loss of too much body fluid can make it dangerous for you to take this medicine. A test called the HbA1C (A1C) will be monitored. This is a simple blood test. It measures your blood sugar control over the last 2 to 3 months. You will receive this test every 3 to 6 months. Learn how to check your blood sugar. Learn the symptoms of low and high blood sugar and how to manage them. Always carry a quick-source of sugar with you in case you have symptoms of low blood sugar. Examples include hard sugar candy or glucose tablets. Make sure others know that you can choke if you eat or drink when you develop serious symptoms of low blood sugar, such as seizures or unconsciousness. They must get medical help at once. Tell your doctor or health care professional if you have high blood sugar. You might need to change  the dose of your medicine. If you are sick or exercising more than usual, you might need to change the dose of your medicine. Do not skip meals. Ask your doctor or health care professional if you should avoid alcohol. Many nonprescription cough and cold products contain sugar or alcohol. These can affect blood sugar. Pens should never be shared. Even if the needle is changed, sharing may result in passing of viruses like hepatitis or HIV. Wear a medical ID bracelet or chain, and carry a card that describes your disease and details of your medicine and dosage times. What side effects may I notice from receiving this medicine? Side effects that you should report to your doctor or health care professional as soon as possible: -allergic reactions like skin rash, itching or hives, swelling of the face, lips, or tongue -breathing problems -diarrhea that continues or is severe -lump or swelling on the neck -severe nausea -signs and symptoms of infection like fever or chills; cough; sore throat; pain or trouble passing urine -signs and symptoms of low blood sugar such as feeling anxious, confusion, dizziness, increased hunger, unusually weak or tired, sweating, shakiness, cold, irritable, headache, blurred vision, fast heartbeat, loss of consciousness -signs and symptoms of kidney injury like trouble passing urine or change in the amount of urine -trouble swallowing -unusual stomach upset or pain -vomiting Side effects that usually do not require medical attention (  report to your doctor or health care professional if they continue or are bothersome): -diarrhea -loss of appetite -nausea -pain, redness, or irritation at site where injected -stomach upset This list may not describe all possible side effects. Call your doctor for medical advice about side effects. You may report side effects to FDA at 1-800-FDA-1088. Where should I keep my medicine? Keep out of the reach of children. Store unopened  pens in a refrigerator between 2 and 8 degrees C (36 and 46 degrees F). Do not freeze or use if the medicine has been frozen. Protect from light and excessive heat. Store in the carton until use. Each single-dose pen can be kept at room temperature, not to exceed 30 degrees C (86 degrees F) for a total of 14 days, if needed. Throw away any unused medicine after the expiration date on the label. NOTE: This sheet is a summary. It may not cover all possible information. If you have questions about this medicine, talk to your doctor, pharmacist, or health care provider.  2019 Elsevier/Gold Standard (2016-06-06 14:35:01)

## 2018-10-14 NOTE — Progress Notes (Signed)
   Subjective:    Patient ID: Amy Banks, female    DOB: [birth year], 43 y.o.   MRN: [id number]  HPI Chief Complaint  Patient presents with  . discuss diabetes    dsicuss diabetes. has not check blood sugars. wants to talk about trucility med possibility   She is here in the office to discuss abnormal labs.  She had been off of her medications for diabetes and hypertension until last week.  Hemoglobin A1c was 11.1% and she was symptomatic with polyuria and polydipsia. States she started back on metformin 5 days ago and no longer having urinary frequency or increased thirst. States she has been on Trulicity in the past and would like to start back on this medication.  States she has made significant changes in her diet and has lost 4 pounds in the past week.  States she is considering IVF and has an OB/GYN as well as an infertility specialist.  Does not want to start on statin.  States she is aware that some of her medications may not be safe if she were to get pregnant  Denies fever, chills, dizziness, chest pain, palpitations, shortness of breath, abdominal pain, N/V/D, urinary symptoms, LE edema.   States anxiety and depression are improving. Plans to schedule with a psychiatrist but has not yet. No SI.   Reviewed allergies, medications, past medical, surgical, family, and social history.    Review of Systems Pertinent positives and negatives in the history of present illness.     Objective:   Physical Exam BP 120/82   Pulse 87   Temp (!) 97.5 F (36.4 C) (Oral)   Ht 5\' 6"  (1.676 m)   Wt (!) 321 lb 6.4 oz (145.8 kg)   LMP 09/07/2018   BMI 51.88 kg/m   Alert and in no distress. Cardiac exam shows a regular sinus rhythm without murmurs or gallops. Lungs are clear to auscultation. LEs without edema. Skin is warm and dry.       Assessment & Plan:  Uncontrolled type 2 diabetes mellitus without complication, without long-term current use of insulin (HCC) - Plan:  Dulaglutide (TRULICITY) 8.14 GY/1.8HU SOPN  Essential hypertension  Morbid obesity (HCC)  Anxiety and depression  Counseling on healthy diet and exercise to help improve blood sugar.  Hemoglobin A1c was 11.1%.  She started back on metformin twice daily last week and reports she is no longer symptomatic.  She will start back on Trulicity.  A sample was given today. She does not want checking her blood sugars but encouraged her to do so for the next few weeks to see if her diabetes is under better control. Her blood pressure has improved significantly since starting back on medication.  Reports feeling well and no side effects.  She will continue on the current medication regimen. Declines statin. Discussed the benefits of a statin and diabetes management. She will discuss diabetes and all of her medications with her infertility specialist and OB/GYN before getting pregnant.  She is aware that some of her medications may not be safe during pregnancy. Once again I encouraged her to call and schedule with a mental health specialist.  She does not appear to be in any danger.  No thoughts of self-harm. Follow-up in 3 months for diabetes

## 2018-11-09 ENCOUNTER — Encounter: Payer: Self-pay | Admitting: Family Medicine

## 2018-11-09 DIAGNOSIS — IMO0001 Reserved for inherently not codable concepts without codable children: Secondary | ICD-10-CM

## 2018-11-09 DIAGNOSIS — I10 Essential (primary) hypertension: Secondary | ICD-10-CM

## 2018-11-10 MED ORDER — DULAGLUTIDE 0.75 MG/0.5ML ~~LOC~~ SOAJ: 1.0000 "application " | SUBCUTANEOUS | 0 refills | Status: DC

## 2018-11-10 MED ORDER — AMLODIPINE BESYLATE 5 MG PO TABS: 5.0000 mg | ORAL_TABLET | Freq: Every day | ORAL | 0 refills | Status: DC

## 2018-11-10 MED ORDER — METFORMIN HCL ER 750 MG PO TB24: 750.0000 mg | ORAL_TABLET | Freq: Every day | ORAL | 0 refills | Status: DC

## 2018-11-10 MED ORDER — LOSARTAN POTASSIUM-HCTZ 100-12.5 MG PO TABS: 1.0000 | ORAL_TABLET | Freq: Every day | ORAL | 0 refills | Status: DC

## 2018-12-14 ENCOUNTER — Encounter: Payer: Self-pay | Admitting: Family Medicine

## 2018-12-14 DIAGNOSIS — I10 Essential (primary) hypertension: Secondary | ICD-10-CM

## 2018-12-14 DIAGNOSIS — IMO0001 Reserved for inherently not codable concepts without codable children: Secondary | ICD-10-CM

## 2018-12-14 MED ORDER — CONTOUR NEXT TEST VI STRP: ORAL_STRIP | 12 refills | Status: DC

## 2018-12-14 MED ORDER — METFORMIN HCL ER 750 MG PO TB24: 750.0000 mg | ORAL_TABLET | Freq: Every day | ORAL | 0 refills | Status: DC

## 2018-12-14 MED ORDER — AMLODIPINE BESYLATE 5 MG PO TABS: 5.0000 mg | ORAL_TABLET | Freq: Every day | ORAL | 0 refills | Status: DC

## 2018-12-14 MED ORDER — TRULICITY 0.75 MG/0.5ML ~~LOC~~ SOAJ: 1.0000 "application " | SUBCUTANEOUS | 0 refills | Status: DC

## 2018-12-14 MED ORDER — LOSARTAN POTASSIUM-HCTZ 100-12.5 MG PO TABS: 1.0000 | ORAL_TABLET | Freq: Every day | ORAL | 0 refills | Status: DC

## 2018-12-19 ENCOUNTER — Encounter: Payer: Self-pay | Admitting: Family Medicine

## 2018-12-19 DIAGNOSIS — IMO0001 Reserved for inherently not codable concepts without codable children: Secondary | ICD-10-CM

## 2018-12-21 MED ORDER — METFORMIN HCL 1000 MG PO TABS: 1000.0000 mg | ORAL_TABLET | Freq: Two times a day (BID) | ORAL | 0 refills | Status: DC

## 2018-12-21 NOTE — Telephone Encounter (Signed)
Sent in metformin 1,000mg  twice a day. Asked pt to start checking her bs 2-3 times a week if she didn't want to check daily

## 2019-01-14 ENCOUNTER — Ambulatory Visit: Payer: BLUE CROSS/BLUE SHIELD | Admitting: Family Medicine

## 2019-04-08 DIAGNOSIS — N971 Female infertility of tubal origin: Secondary | ICD-10-CM | POA: Diagnosis not present

## 2019-04-08 DIAGNOSIS — D251 Intramural leiomyoma of uterus: Secondary | ICD-10-CM | POA: Diagnosis not present

## 2019-04-08 DIAGNOSIS — Z319 Encounter for procreative management, unspecified: Secondary | ICD-10-CM | POA: Diagnosis not present

## 2019-04-09 ENCOUNTER — Telehealth: Payer: 59

## 2019-04-09 ENCOUNTER — Telehealth: Payer: Self-pay

## 2019-04-09 ENCOUNTER — Ambulatory Visit
Admission: EM | Admit: 2019-04-09 | Discharge: 2019-04-09 | Disposition: A | Discharge status: Home / Self Care | Payer: BC Managed Care – PPO | Attending: Emergency Medicine | Admitting: Emergency Medicine

## 2019-04-09 DIAGNOSIS — L309 Dermatitis, unspecified: Secondary | ICD-10-CM

## 2019-04-09 DIAGNOSIS — I160 Hypertensive urgency: Secondary | ICD-10-CM

## 2019-04-09 DIAGNOSIS — N61 Mastitis without abscess: Secondary | ICD-10-CM

## 2019-04-09 MED ORDER — MICONAZOLE NITRATE POWD: 1.0000 "application " | Freq: Two times a day (BID) | 1 refills | Status: AC | PRN

## 2019-04-09 MED ORDER — DOXYCYCLINE HYCLATE 100 MG PO CAPS: 100.0000 mg | ORAL_CAPSULE | Freq: Two times a day (BID) | ORAL | 0 refills | Status: DC

## 2019-04-09 NOTE — Telephone Encounter (Signed)
Pt. Called at 2:05 p.m. stating that her lt. Breast was warm to the touch and sore, also going to her arm pit was sore as well, I told the pt. She may want to go to an Urgent Care since we only had one provider here and his schedule is already full for today, pt. Was ok with that and said she will go to the [facility].

## 2019-04-09 NOTE — Discharge Instructions (Signed)
May use athlete's foot cream under breast if needed, the powder is preferred to keep the area dry.  Keep area(s) clean and dry. Take antibiotic as prescribed with food - important to complete course. Recommend you follow-up with your OB/GYN should symptoms persist, worsen after antibiotic use. Keep your mammogram appointment on 11/17.  Helpful prevention tips: Keep nails short to avoid secondary skin infections. Use new, clean razors when shaving. Avoid antiperspirants - look for deodorants without aluminum. Avoid wearing underwire bras as this can irritate the area further.

## 2019-04-09 NOTE — ED Provider Notes (Signed)
[facility]    CSN: [id number] Arrival date & time: 04/09/19  1621      History   Chief Complaint No chief complaint on file.   HPI Amy Banks is a 43 y.o. female with history of diabetes, hypertension, obesity presenting for left breast redness, pain, warmth since yesterday.  Patient states that she had a pruritic rash under her left breast few days prior, which she had been itching.  LMP 10/28: Denies breast-feeding.  Patient does endorse paternal grandmother with history of breast cancer, unsure of age of diagnosis.  Patient denies change in appearance of breast otherwise, no nipple inversion or discharge.  Patient denies palpable mass, change from baseline.  Has not tried anything for symptoms.  Past Medical History:  Diagnosis Date  . Anemia   . Anxiety   . Depression   . Diabetes mellitus without complication ([place])   . Diverticulitis 08/2013  . Fibroids    tx w/ hormone meds  . History of blood transfusion 03/2016   at [facility]  . Hypertension   . Infertility, female    on hormone meds  . Morbid obesity [facility])     Patient Active Problem List   Diagnosis Date Noted  . Anxiety and depression 10/14/2018  . Leiomyoma 06/13/2017  . Uncontrolled hypertension 06/04/2017  . Morbid obesity ([place]) 02/13/2017  . Elevated serum glucose with glucosuria 02/13/2017  . Fibroids   . Uncontrolled type 2 diabetes mellitus without complication, without long-term current use of insulin   . 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 Essential hypertension 05/11/2015  . History of anxiety 05/11/2015  . Menorrhagia 01/02/2015  . Diverticulitis of colon without hemorrhage 10/13/2013  . Anemia 10/13/2013    Past Surgical History:  Procedure Laterality Date  . BREAST SURGERY  2001   reduction  . LAPAROTOMY Right 01/02/2015   Procedure: EXPLORATORY LAPAROTOMY WITH RIGHT OVARIAN CYSTECTOMY;  Surgeon: Marylynn Pearson, MD;  Location: [facility];  Service:  Gynecology;  Laterality: Right;  . LAPAROTOMY N/A 06/13/2017   Procedure: MINI LAPAROTOMY;  Surgeon: Governor Specking, MD;  Location: [facility];  Service: Gynecology;  Laterality: N/A;  . LYSIS OF ADHESION N/A 01/02/2015   Procedure: LYSIS OF ADHESION;  Surgeon: Marylynn Pearson, MD;  Location: [facility];  Service: Gynecology;  Laterality: N/A;  . LYSIS OF ADHESION N/A 06/13/2017   Procedure: LYSIS OF ADHESION;  Surgeon: Governor Specking, MD;  Location: [facility];  Service: Gynecology;  Laterality: N/A;  . MYOMECTOMY  2009  . ROBOT ASSISTED MYOMECTOMY N/A 06/13/2017   Procedure: ATTEMPTED ROBOTIC ASSISTED MYOMECTOMY;  Surgeon: Governor Specking, MD;  Location: [facility];  Service: Gynecology;  Laterality: N/A;    OB History    Gravida  0   Para  0   Term  0   Preterm  0   AB  0   Living  0     SAB  0   TAB  0   Ectopic  0   Multiple  0   Live Births               Home Medications    Prior to Admission medications   Medication Sig Start Date End Date Taking? Authorizing Provider  ALPRAZolam (XANAX) 0.25 MG tablet TAKE 1 TABLET BY MOUTH TWICE DAILY AS NEEDED FOR ANXIETY 12/03/17   Henson, Vickie L, NP-C  amLODipine (NORVASC) 5 MG tablet Take 1 tablet (5 mg total) by mouth daily. 12/14/18   Harland Dingwall  L, NP-C  doxycycline (VIBRAMYCIN) 100 MG capsule Take 1 capsule (100 mg total) by mouth 2 (two) times daily. 04/09/19   Hall-Potvin, [place], PA-C  Dulaglutide (TRULICITY) A999333 [id number] SOPN Inject 1 application into the skin once a week. 12/14/18   Henson, Vickie L, NP-C  ferrous sulfate 325 (65 FE) MG EC tablet Take 325 mg by mouth daily with breakfast.    [provider]  glucose blood (CONTOUR NEXT TEST) test strip Test twice a day 12/14/18   Raenette Rover, Vickie L, NP-C  ibuprofen (ADVIL,MOTRIN) 200 MG tablet Take 200-800 mg by mouth every 4 (four) hours as needed for moderate pain or cramping (depends on pain level if takes 2-4 tablets).    [provider]   losartan-hydrochlorothiazide (HYZAAR) 100-12.5 MG tablet Take 1 tablet by mouth daily. 12/14/18   Henson, Vickie L, NP-C  metFORMIN (GLUCOPHAGE) 1000 MG tablet Take 1 tablet (1,000 mg total) by mouth 2 (two) times daily with a meal. 12/21/18   Henson, Vickie L, NP-C  miconazole nitrate (MICATIN) POWD Apply 1 application topically 2 (two) times daily as needed. 04/09/19   Hall-Potvin, [place], PA-C  Microlet Lancets MISC Test twice a day 10/09/18   [name], NP-C    Family History Family History  Problem Relation Age of Onset  . Diabetes Mother   . Hypertension Mother   . Diabetes Sister   . Diabetes Maternal Grandmother   . Hypertension Maternal Grandmother   . Hypertension Other   . Diabetes Other   . Breast cancer Other   . Ovarian cancer Other   . Cancer Maternal Aunt     Social History Social History   Tobacco Use  . Smoking status: Never Smoker  . Smokeless tobacco: Never Used  Substance Use Topics  . Alcohol use: No  . Drug use: No     Allergies   Codeine   Review of Systems Review of Systems  Constitutional: Negative for fatigue and fever.  HENT: Negative for ear pain, sinus pain, sore throat and voice change.   Eyes: Negative for pain, redness and visual disturbance.  Respiratory: Negative for cough and shortness of breath.   Cardiovascular: Negative for chest pain and palpitations.  Gastrointestinal: Negative for abdominal pain, diarrhea and vomiting.  Musculoskeletal: Negative for arthralgias and myalgias.  Skin: Positive for rash. Negative for wound.  Neurological: Negative for syncope and headaches.     Physical Exam Triage Vital Signs ED Triage Vitals  Enc Vitals Group     BP      Pulse      Resp      Temp      Temp src      SpO2      Weight      Height      Head Circumference      Peak Flow      Pain Score      Pain Loc      Pain Edu?      Excl. in [place]?    No data found.  Updated Vital Signs BP (!) 178/124 (BP Location: Left  Arm) Comment: PA notified  Pulse (!) 111   Temp 99.4 F (37.4 C) (Oral)   Resp 18   LMP 03/31/2019   SpO2 95%   Visual Acuity Right Eye Distance:   Left Eye Distance:   Bilateral Distance:    Right Eye Near:   Left Eye Near:    Bilateral Near:     Physical Exam  Constitutional:      General: She is not in acute distress.    Appearance: She is obese. She is not ill-appearing.  HENT:     Head: Normocephalic and atraumatic.     Mouth/Throat:     Mouth: Mucous membranes are moist.     Pharynx: Oropharynx is clear.  Eyes:     General: No scleral icterus.    Pupils: Pupils are equal, round, and reactive to light.  Cardiovascular:     Rate and Rhythm: Regular rhythm. Tachycardia present.     Heart sounds: No murmur. No gallop.   Pulmonary:     Effort: Pulmonary effort is normal. No respiratory distress.     Breath sounds: No wheezing.  Chest:     Chest wall: No mass or crepitus.     Breasts: Tanner Score is 5.        Right: No swelling, bleeding, inverted nipple, nipple discharge, skin change or tenderness.        Left: Tenderness present. No swelling, bleeding, inverted nipple, nipple discharge or skin change.    Lymphadenopathy:     Upper Body:     Right upper body: No supraclavicular or axillary adenopathy.     Left upper body: No supraclavicular or axillary adenopathy.  Skin:    Capillary Refill: Capillary refill takes less than 2 seconds.  Neurological:     General: No focal deficit present.     Mental Status: She is alert and oriented to person, place, and time.      UC Treatments / Results  Labs (all labs ordered are listed, but only abnormal results are displayed) Labs Reviewed - No data to display  EKG   Radiology No results found.  Procedures Procedures (including critical care time)  Medications Ordered in UC Medications - No data to display  Initial Impression / Assessment and Plan / UC Course  I have reviewed the triage vital signs and the  nursing notes.  Pertinent labs & imaging results that were available during my care of the patient were reviewed by me and considered in my medical decision making (see chart for details).     Patient hypertensive in office: Has not yet taken her blood pressure medications.  Has had similar reading in the past when off of her medications.  States she has a way to check her blood pressure at home, is asymptomatic: Will check daily and follow-up with PCP.  Patient with elevated temperature and clinical signs of cellulitis.  Will treat with doxycycline.  Patient has a mammogram appointment on 11/17: Intends to keep this.  Low suspicion for malignancy at this time, though would be more concerned should she be refractory to antibiotic use.  Patient to follow-up with OB/GYN.  Will give miconazole powder, advised patient to use OTC athlete's foot cream if needed, for heat dermatitis under left breast (> right breast)-it is likely that cellulitis is second to patient itching this area, creating excoriations. Return precautions discussed, patient verbalized understanding and is agreeable to plan.  Final Clinical Impressions(s) / UC Diagnoses   Final diagnoses:  Cellulitis of left breast  Dermatitis     Discharge Instructions     May use athlete's foot cream under breast if needed, the powder is preferred to keep the area dry.  Keep area(s) clean and dry. Take antibiotic as prescribed with food - important to complete course. Recommend you follow-up with your OB/GYN should symptoms persist, worsen after antibiotic use. Keep your mammogram appointment on  11/17.  Helpful prevention tips: Keep nails short to avoid secondary skin infections. Use new, clean razors when shaving. Avoid antiperspirants - look for deodorants without aluminum. Avoid wearing underwire bras as this can irritate the area further.     ED Prescriptions    Medication Sig Dispense Auth. Provider   doxycycline (VIBRAMYCIN)  100 MG capsule Take 1 capsule (100 mg total) by mouth 2 (two) times daily. 20 capsule Hall-Potvin, [place], PA-C   miconazole nitrate (MICATIN) POWD Apply 1 application topically 2 (two) times daily as needed. 100 g Hall-Potvin, [place], PA-C     PDMP not reviewed this encounter.   Hall-Potvin, [place], [place] 04/09/19 1736

## 2019-04-20 DIAGNOSIS — Z1231 Encounter for screening mammogram for malignant neoplasm of breast: Secondary | ICD-10-CM | POA: Diagnosis not present

## 2019-04-20 LAB — HM MAMMOGRAPHY

## 2019-04-26 ENCOUNTER — Encounter: Payer: Self-pay | Admitting: Internal Medicine

## 2019-06-26 LAB — HM DIABETES EYE EXAM

## 2019-07-09 ENCOUNTER — Encounter: Payer: Self-pay | Admitting: Internal Medicine

## 2019-07-13 DIAGNOSIS — E282 Polycystic ovarian syndrome: Secondary | ICD-10-CM | POA: Diagnosis not present

## 2019-07-13 DIAGNOSIS — N926 Irregular menstruation, unspecified: Secondary | ICD-10-CM | POA: Diagnosis not present

## 2019-07-13 DIAGNOSIS — E119 Type 2 diabetes mellitus without complications: Secondary | ICD-10-CM | POA: Diagnosis not present

## 2019-07-13 DIAGNOSIS — Z6841 Body Mass Index (BMI) 40.0 and over, adult: Secondary | ICD-10-CM | POA: Diagnosis not present

## 2019-08-04 ENCOUNTER — Other Ambulatory Visit: Payer: BC Managed Care – PPO

## 2019-08-05 ENCOUNTER — Ambulatory Visit: Payer: BC Managed Care – PPO | Attending: Internal Medicine

## 2019-08-05 DIAGNOSIS — Z23 Encounter for immunization: Secondary | ICD-10-CM | POA: Insufficient documentation

## 2019-08-05 NOTE — Progress Notes (Signed)
   Covid-19 Vaccination Clinic  Name:  Amy Banks    MRN: [id number] DOB: May 26, 1976  08/05/2019  Amy Banks was observed post Covid-19 immunization for 15 minutes without incident. She was provided with Vaccine Information Sheet and instruction to access the V-Safe system.   Amy Banks was instructed to call 911 with any severe reactions post vaccine: Marland Kitchen Difficulty breathing  . Swelling of face and throat  . A fast heartbeat  . A bad rash all over body  . Dizziness and weakness   Immunizations Administered    Name Date Dose VIS Date Route   Pfizer COVID-19 Vaccine 08/05/2019  4:47 PM 0.3 mL 05/14/2019 Intramuscular   Manufacturer: [place]   Lot: [id number]   [place]: [id number]

## 2019-08-18 ENCOUNTER — Telehealth: Payer: Self-pay | Admitting: Family Medicine

## 2019-08-18 NOTE — Telephone Encounter (Signed)
Called pt she needs diabetic visit and cpe.  She will call us back when she gets home.

## 2019-09-01 ENCOUNTER — Ambulatory Visit: Payer: BC Managed Care – PPO | Attending: Internal Medicine

## 2019-09-01 DIAGNOSIS — Z23 Encounter for immunization: Secondary | ICD-10-CM

## 2019-09-01 NOTE — Progress Notes (Signed)
   Covid-19 Vaccination Clinic  Name:  Amy Banks    MRN: [id number] DOB: 12/03/1975  09/01/2019  Amy Banks was observed post Covid-19 immunization for 15 minutes without incident. She was provided with Vaccine Information Sheet and instruction to access the V-Safe system.   Amy Banks was instructed to call 911 with any severe reactions post vaccine: Marland Kitchen Difficulty breathing  . Swelling of face and throat  . A fast heartbeat  . A bad rash all over body  . Dizziness and weakness   Immunizations Administered    Name Date Dose VIS Date Route   Pfizer COVID-19 Vaccine 09/01/2019  3:59 PM 0.3 mL 05/14/2019 Intramuscular   Manufacturer: [employer]   Lot: [id number]   [place]: [id number]

## 2019-09-06 ENCOUNTER — Telehealth: Payer: Self-pay | Admitting: Internal Medicine

## 2019-09-06 NOTE — Telephone Encounter (Signed)
Left message for pt to call back to schedule appt, CPE and DM

## 2019-11-12 DIAGNOSIS — M9902 Segmental and somatic dysfunction of thoracic region: Secondary | ICD-10-CM | POA: Diagnosis not present

## 2019-11-12 DIAGNOSIS — M5414 Radiculopathy, thoracic region: Secondary | ICD-10-CM | POA: Diagnosis not present

## 2019-11-12 DIAGNOSIS — M9904 Segmental and somatic dysfunction of sacral region: Secondary | ICD-10-CM | POA: Diagnosis not present

## 2019-11-12 DIAGNOSIS — M9903 Segmental and somatic dysfunction of lumbar region: Secondary | ICD-10-CM | POA: Diagnosis not present

## 2019-11-13 ENCOUNTER — Other Ambulatory Visit: Payer: Self-pay

## 2019-11-13 ENCOUNTER — Emergency Department (HOSPITAL_COMMUNITY): Payer: BC Managed Care – PPO

## 2019-11-13 ENCOUNTER — Ambulatory Visit: Payer: Self-pay

## 2019-11-13 ENCOUNTER — Inpatient Hospital Stay (HOSPITAL_COMMUNITY)
Admission: EM | Admit: 2019-11-13 | Discharge: 2019-11-15 | DRG: 304 | Disposition: A | Discharge status: Home / Self Care | Payer: BC Managed Care – PPO | Attending: Internal Medicine | Admitting: Internal Medicine

## 2019-11-13 ENCOUNTER — Ambulatory Visit (INDEPENDENT_AMBULATORY_CARE_PROVIDER_SITE_OTHER)
Admission: EM | Admit: 2019-11-13 | Discharge: 2019-11-13 | Disposition: A | Discharge status: Home / Self Care | Payer: BC Managed Care – PPO | Source: Home / Self Care

## 2019-11-13 ENCOUNTER — Encounter (HOSPITAL_COMMUNITY): Payer: Self-pay

## 2019-11-13 DIAGNOSIS — I5021 Acute systolic (congestive) heart failure: Secondary | ICD-10-CM | POA: Diagnosis present

## 2019-11-13 DIAGNOSIS — Z79899 Other long term (current) drug therapy: Secondary | ICD-10-CM | POA: Diagnosis not present

## 2019-11-13 DIAGNOSIS — Z885 Allergy status to narcotic agent status: Secondary | ICD-10-CM

## 2019-11-13 DIAGNOSIS — Z713 Dietary counseling and surveillance: Secondary | ICD-10-CM

## 2019-11-13 DIAGNOSIS — D509 Iron deficiency anemia, unspecified: Secondary | ICD-10-CM | POA: Diagnosis not present

## 2019-11-13 DIAGNOSIS — R0789 Other chest pain: Secondary | ICD-10-CM | POA: Diagnosis not present

## 2019-11-13 DIAGNOSIS — I161 Hypertensive emergency: Secondary | ICD-10-CM | POA: Diagnosis not present

## 2019-11-13 DIAGNOSIS — Z7984 Long term (current) use of oral hypoglycemic drugs: Secondary | ICD-10-CM | POA: Diagnosis not present

## 2019-11-13 DIAGNOSIS — Z20822 Contact with and (suspected) exposure to covid-19: Secondary | ICD-10-CM | POA: Diagnosis present

## 2019-11-13 DIAGNOSIS — I10 Essential (primary) hypertension: Secondary | ICD-10-CM | POA: Diagnosis not present

## 2019-11-13 DIAGNOSIS — E1165 Type 2 diabetes mellitus with hyperglycemia: Secondary | ICD-10-CM

## 2019-11-13 DIAGNOSIS — I160 Hypertensive urgency: Secondary | ICD-10-CM

## 2019-11-13 DIAGNOSIS — I110 Hypertensive heart disease with heart failure: Secondary | ICD-10-CM | POA: Diagnosis present

## 2019-11-13 DIAGNOSIS — Z6841 Body Mass Index (BMI) 40.0 and over, adult: Secondary | ICD-10-CM | POA: Diagnosis not present

## 2019-11-13 DIAGNOSIS — I169 Hypertensive crisis, unspecified: Secondary | ICD-10-CM | POA: Diagnosis not present

## 2019-11-13 DIAGNOSIS — R519 Headache, unspecified: Secondary | ICD-10-CM | POA: Diagnosis not present

## 2019-11-13 DIAGNOSIS — R079 Chest pain, unspecified: Secondary | ICD-10-CM | POA: Diagnosis not present

## 2019-11-13 DIAGNOSIS — F329 Major depressive disorder, single episode, unspecified: Secondary | ICD-10-CM | POA: Diagnosis not present

## 2019-11-13 DIAGNOSIS — F419 Anxiety disorder, unspecified: Secondary | ICD-10-CM | POA: Diagnosis not present

## 2019-11-13 LAB — CBC
HCT: 45.9 % (ref 36.0–46.0)
Hemoglobin: 14.3 g/dL (ref 12.0–15.0)
MCH: 26.8 pg (ref 26.0–34.0)
MCHC: 31.2 g/dL (ref 30.0–36.0)
MCV: 86 fL (ref 80.0–100.0)
Platelets: 362 10*3/uL (ref 150–400)
RBC: 5.34 MIL/uL — ABNORMAL HIGH (ref 3.87–5.11)
RDW: 13.9 % (ref 11.5–15.5)
WBC: 9.6 10*3/uL (ref 4.0–10.5)
nRBC: 0 % (ref 0.0–0.2)

## 2019-11-13 LAB — BASIC METABOLIC PANEL
Anion gap: 10 (ref 5–15)
BUN: 9 mg/dL (ref 6–20)
CO2: 29 mmol/L (ref 22–32)
Calcium: 9.9 mg/dL (ref 8.9–10.3)
Chloride: 99 mmol/L (ref 98–111)
Creatinine, Ser: 0.83 mg/dL (ref 0.44–1.00)
GFR calc Af Amer: >60 mL/min (ref >60)
GFR calc non Af Amer: >60 mL/min (ref >60)
Glucose, Bld: 369 mg/dL — ABNORMAL HIGH (ref 70–99)
Potassium: 4.4 mmol/L (ref 3.5–5.1)
Sodium: 138 mmol/L (ref 135–145)

## 2019-11-13 LAB — I-STAT BETA HCG BLOOD, ED (MC, WL, AP ONLY): I-stat hCG, quantitative: <5 m[IU]/mL (ref <5)

## 2019-11-13 LAB — SARS CORONAVIRUS 2 BY RT PCR (HOSPITAL ORDER, PERFORMED IN ~~LOC~~ HOSPITAL LAB): SARS Coronavirus 2: NEGATIVE

## 2019-11-13 LAB — CBG MONITORING, ED: Glucose-Capillary: 297 mg/dL — ABNORMAL HIGH (ref 70–99)

## 2019-11-13 LAB — TROPONIN I (HIGH SENSITIVITY)
Troponin I (High Sensitivity): 8 ng/L (ref <18)
Troponin I (High Sensitivity): 8 ng/L (ref <18)

## 2019-11-13 MED ORDER — HYDROCHLOROTHIAZIDE 12.5 MG PO CAPS: 12.5000 mg | ORAL_CAPSULE | Freq: Every day | ORAL | Status: DC

## 2019-11-13 MED ORDER — INSULIN GLARGINE 100 UNIT/ML ~~LOC~~ SOLN
20.0000 [IU] | Freq: Every day | SUBCUTANEOUS | Status: DC
  Administered 2019-11-13: 20 [IU] via SUBCUTANEOUS
  Filled 2019-11-13 (×2): qty 0.2

## 2019-11-13 MED ORDER — SODIUM CHLORIDE 0.9% FLUSH
3.0000 mL | Freq: Once | INTRAVENOUS | Status: AC
  Administered 2019-11-13: 3 mL via INTRAVENOUS

## 2019-11-13 MED ORDER — KETOROLAC TROMETHAMINE 30 MG/ML IJ SOLN
30.0000 mg | Freq: Once | INTRAMUSCULAR | Status: AC
  Administered 2019-11-13: 30 mg via INTRAVENOUS
  Filled 2019-11-13: qty 1

## 2019-11-13 MED ORDER — ACETAMINOPHEN 650 MG RE SUPP: 650.0000 mg | Freq: Four times a day (QID) | RECTAL | Status: DC | PRN

## 2019-11-13 MED ORDER — ACETAMINOPHEN 325 MG PO TABS
650.0000 mg | ORAL_TABLET | Freq: Four times a day (QID) | ORAL | Status: DC | PRN
  Administered 2019-11-14 (×3): 650 mg via ORAL
  Filled 2019-11-13 (×3): qty 2

## 2019-11-13 MED ORDER — AMLODIPINE BESYLATE 10 MG PO TABS
10.0000 mg | ORAL_TABLET | Freq: Every day | ORAL | Status: DC
  Administered 2019-11-14 – 2019-11-15 (×2): 10 mg via ORAL
  Filled 2019-11-13 (×2): qty 1

## 2019-11-13 MED ORDER — ONDANSETRON HCL 4 MG/2ML IJ SOLN: 4.0000 mg | Freq: Four times a day (QID) | INTRAMUSCULAR | Status: DC | PRN

## 2019-11-13 MED ORDER — LABETALOL HCL 5 MG/ML IV SOLN
5.0000 mg | Freq: Once | INTRAVENOUS | Status: AC
  Administered 2019-11-13: 5 mg via INTRAVENOUS
  Filled 2019-11-13: qty 4

## 2019-11-13 MED ORDER — ONDANSETRON HCL 4 MG PO TABS: 4.0000 mg | ORAL_TABLET | Freq: Four times a day (QID) | ORAL | Status: DC | PRN

## 2019-11-13 MED ORDER — INSULIN ASPART 100 UNIT/ML ~~LOC~~ SOLN
0.0000 [IU] | Freq: Three times a day (TID) | SUBCUTANEOUS | Status: DC
  Administered 2019-11-13: 11 [IU] via SUBCUTANEOUS
  Administered 2019-11-14: 4 [IU] via SUBCUTANEOUS
  Administered 2019-11-14: 7 [IU] via SUBCUTANEOUS
  Administered 2019-11-14 (×2): 11 [IU] via SUBCUTANEOUS
  Administered 2019-11-15: 7 [IU] via SUBCUTANEOUS

## 2019-11-13 MED ORDER — ALPRAZOLAM 0.25 MG PO TABS: 0.2500 mg | ORAL_TABLET | Freq: Two times a day (BID) | ORAL | Status: DC | PRN

## 2019-11-13 MED ORDER — ENOXAPARIN SODIUM 40 MG/0.4ML ~~LOC~~ SOLN
40.0000 mg | SUBCUTANEOUS | Status: DC
  Administered 2019-11-14 – 2019-11-15 (×2): 40 mg via SUBCUTANEOUS
  Filled 2019-11-13 (×2): qty 0.4

## 2019-11-13 MED ORDER — LOSARTAN POTASSIUM-HCTZ 100-12.5 MG PO TABS: 1.0000 | ORAL_TABLET | Freq: Every day | ORAL | Status: DC

## 2019-11-13 MED ORDER — LOSARTAN POTASSIUM 50 MG PO TABS
100.0000 mg | ORAL_TABLET | Freq: Every day | ORAL | Status: DC
  Administered 2019-11-14 – 2019-11-15 (×2): 100 mg via ORAL
  Filled 2019-11-13 (×2): qty 2

## 2019-11-13 MED ORDER — HYDROCHLOROTHIAZIDE 12.5 MG PO CAPS
12.5000 mg | ORAL_CAPSULE | Freq: Every day | ORAL | Status: DC
  Administered 2019-11-14 – 2019-11-15 (×2): 12.5 mg via ORAL
  Filled 2019-11-13 (×2): qty 1

## 2019-11-13 MED ORDER — INSULIN ASPART 100 UNIT/ML ~~LOC~~ SOLN
6.0000 [IU] | Freq: Three times a day (TID) | SUBCUTANEOUS | Status: DC
  Administered 2019-11-14 – 2019-11-15 (×4): 6 [IU] via SUBCUTANEOUS

## 2019-11-13 MED ORDER — POLYETHYLENE GLYCOL 3350 17 G PO PACK: 17.0000 g | PACK | Freq: Every day | ORAL | Status: DC | PRN

## 2019-11-13 MED ORDER — HYDRALAZINE HCL 20 MG/ML IJ SOLN
10.0000 mg | Freq: Four times a day (QID) | INTRAMUSCULAR | Status: DC | PRN
  Administered 2019-11-14: 10 mg via INTRAVENOUS
  Filled 2019-11-13: qty 1

## 2019-11-13 NOTE — ED Notes (Signed)
IV team at bedside 

## 2019-11-13 NOTE — ED Triage Notes (Signed)
Pt arrives to ED from UC w/ c/o tachycardia, headache, and cough that started yesterday.

## 2019-11-13 NOTE — ED Notes (Signed)
Multiple IV team attempts without success IV team Consulted

## 2019-11-13 NOTE — H&P (Signed)
History and Physical    Amy Banks XIP:[id number] DOB: Oct 09, 1975 DOA: 11/13/2019  PCP: [name], NP-C  Patient coming from: Home   Chief Complaint:  Chief Complaint  Patient presents with  . Tachycardia     HPI:    44 year old female with past medical history of hypertension, obesity, diabetes mellitus type 2, iron deficiency anemia, menorrhagia who presents to [facility] emergency department with complaints of headache and chest pressure.  Patient explains that in January all of her medications were stopped by her outpatient provider after she was told that her blood pressures were "normal" without any medication.  Patient explains that 3 days ago she began to develop a headache. This headache was moderate to severe in intensity, frontal in location and nonradiating. Headache was constant and persisted throughout the day. The following day she presented to her chiropractor for blood pressure and told her that it was high and that she needed to be evaluated.  Patient notes that in the days that followed, she began to develop associated shortness of breath, moderate in intensity without any alleviating or exacerbating factors associated with feelings of chest pressure. Patient describes the chest pressure as midsternal in location, nonradiating and not associated with exertion. Patient denies any fever, cough, sick contacts, leg swelling, paroxysmal nocturnal dyspnea or pillow orthopnea.  Patient also reports a short bout of palpitations that she states correlated with a heart rate of the 110's according to her smart watch. Due to continued symptoms the morning of 6/12 the patient took her old home regimen of losartan, hydrochlorothiazide and amlodipine.  Patient eventually presented to [facility] emergency department for evaluation. Upon evaluation in the emergency department patient was found to have markedly elevated blood pressures as high as 220/138.  Troponin and EKG were unremarkable. Patient was administered intravenous labetalol. The hospital group was then called to assess patient for admission the hospital.   Review of Systems: A 10-system review of systems has been performed and all systems are negative with the exception of what is listed in the HPI.    Past Medical History:  Diagnosis Date  . Anemia   . Anxiety   . Depression   . Diabetes mellitus without complication ([place])   . Diverticulitis 08/2013  . Fibroids    tx w/ hormone meds  . History of blood transfusion 03/2016   at [facility]  . Hypertension   . Infertility, female    on hormone meds  . Morbid obesity ([place])     Past Surgical History:  Procedure Laterality Date  . BREAST SURGERY  2001   reduction  . LAPAROTOMY Right 01/02/2015   Procedure: EXPLORATORY LAPAROTOMY WITH RIGHT OVARIAN CYSTECTOMY;  Surgeon: Marylynn Pearson, MD;  Location: [place] [facility];  Service: Gynecology;  Laterality: Right;  . LAPAROTOMY N/A 06/13/2017   Procedure: MINI LAPAROTOMY;  Surgeon: Governor Specking, MD;  Location: [place] [facility];  Service: Gynecology;  Laterality: N/A;  . LYSIS OF ADHESION N/A 01/02/2015   Procedure: LYSIS OF ADHESION;  Surgeon: Marylynn Pearson, MD;  Location: [place] [facility];  Service: Gynecology;  Laterality: N/A;  . LYSIS OF ADHESION N/A 06/13/2017   Procedure: LYSIS OF ADHESION;  Surgeon: Governor Specking, MD;  Location: [place] [facility];  Service: Gynecology;  Laterality: N/A;  . MYOMECTOMY  2009  . ROBOT ASSISTED MYOMECTOMY N/A 06/13/2017   Procedure: ATTEMPTED ROBOTIC ASSISTED MYOMECTOMY;  Surgeon: Governor Specking, MD;  Location: [place] [facility];  Service: Gynecology;  Laterality: N/A;     reports  that she has never smoked. She has never used smokeless tobacco. She reports that she does not drink alcohol and does not use drugs.  Allergies  Allergen Reactions  . Codeine Hives and Itching    Family History  Problem Relation Age of Onset  . Diabetes Mother   . Hypertension Mother   .  Diabetes Sister   . Diabetes Maternal Grandmother   . Hypertension Maternal Grandmother   . Hypertension Other   . Diabetes Other   . Breast cancer Other   . Ovarian cancer Other   . Cancer Maternal Aunt      Prior to Admission medications   Medication Sig Start Date End Date Taking? Authorizing Provider  ALPRAZolam (XANAX) 0.25 MG tablet TAKE 1 TABLET BY MOUTH TWICE DAILY AS NEEDED FOR ANXIETY Patient taking differently: Take 0.25 mg by mouth as needed for anxiety.  12/03/17  Yes Henson, Vickie L, NP-C  amLODipine (NORVASC) 5 MG tablet Take 1 tablet (5 mg total) by mouth daily. 12/14/18  Yes Henson, Vickie L, NP-C  glucose blood (CONTOUR NEXT TEST) test strip Test twice a day 12/14/18  Yes Henson, Vickie L, NP-C  losartan-hydrochlorothiazide (HYZAAR) 100-12.5 MG tablet Take 1 tablet by mouth daily. 12/14/18  Yes Henson, Vickie L, NP-C  metFORMIN (GLUCOPHAGE) 1000 MG tablet Take 1 tablet (1,000 mg total) by mouth 2 (two) times daily with a meal. Patient taking differently: Take 1,000 mg by mouth at bedtime.  12/21/18  Yes Henson, Vickie L, NP-C  miconazole nitrate (MICATIN) POWD Apply 1 application topically 2 (two) times daily as needed. 04/09/19  Yes Hall-Potvin, [place], PA-C  Microlet Lancets MISC Test twice a day 10/09/18  Yes Henson, Vickie L, NP-C  VITAMIN D PO Take 1 tablet by mouth daily. gummies   Yes [provider]  Dulaglutide (TRULICITY) 1.06 YI/9.4WN SOPN Inject 1 application into the skin once a week. Patient not taking: Reported on 11/13/2019 12/14/18   [name], NP-C    Physical Exam: Vitals:   11/13/19 1946 11/13/19 1947 11/13/19 1948 11/13/19 1949  BP: (!) 211/121     Pulse: 80 75 77 83  Resp: 20 19 (!) 21 (!) 22  Temp:      TempSrc:      SpO2: 97% 98% 97% 97%    Constitutional: Acute alert and oriented x3, no associated distress. Patient is obese. Skin: no rashes, no lesions, good skin turgor noted. Eyes: Pupils are equally reactive to light.  No  evidence of scleral icterus or conjunctival pallor.  ENMT: Moist mucous membranes noted.  Posterior pharynx clear of any exudate or lesions.   Neck: normal, supple, no masses, no thyromegaly.  No evidence of jugular venous distension.   Respiratory: clear to auscultation bilaterally, no wheezing, no crackles. Normal respiratory effort. No accessory muscle use.  Cardiovascular: Regular rate and rhythm, no murmurs / rubs / gallops. No extremity edema. 2+ pedal pulses. No carotid bruits.  Chest:   Nontender without crepitus or deformity.   Back:   Nontender without crepitus or deformity. Abdomen: Abdomen is soft and nontender.  No evidence of intra-abdominal masses.  Positive bowel sounds noted in all quadrants.   Musculoskeletal: No joint deformity upper and lower extremities. Good ROM, no contractures. Normal muscle tone.  Neurologic: CN 2-12 grossly intact. Sensation intact, strength noted to be 5 out of 5 in all 4 extremities.  Patient is following all commands.  Patient is responsive to verbal stimuli.   Psychiatric: Patient presents as a  normal mood with appropriate affect.  Patient seems to possess insight as to theircurrent situation.     Labs on Admission: I have personally reviewed following labs and imaging studies -   CBC: Recent Labs  Lab 11/13/19 1413  WBC 9.6  HGB 14.3  HCT 45.9  MCV 86.0  PLT 025   Basic Metabolic Panel: Recent Labs  Lab 11/13/19 1413  NA 138  K 4.4  CL 99  CO2 29  GLUCOSE 369*  BUN 9  CREATININE 0.83  CALCIUM 9.9   GFR: CrCl cannot be calculated (Unknown ideal weight.). Liver Function Tests: No results for input(s): AST, ALT, ALKPHOS, BILITOT, PROT, ALBUMIN in the last 168 hours. No results for input(s): LIPASE, AMYLASE in the last 168 hours. No results for input(s): AMMONIA in the last 168 hours. Coagulation Profile: No results for input(s): INR, PROTIME in the last 168 hours. Cardiac Enzymes: No results for input(s): CKTOTAL, CKMB,  CKMBINDEX, TROPONINI in the last 168 hours. BNP (last 3 results) No results for input(s): PROBNP in the last 8760 hours. HbA1C: No results for input(s): HGBA1C in the last 72 hours. CBG: No results for input(s): GLUCAP in the last 168 hours. Lipid Profile: No results for input(s): CHOL, HDL, LDLCALC, TRIG, CHOLHDL, LDLDIRECT in the last 72 hours. Thyroid Function Tests: No results for input(s): TSH, T4TOTAL, FREET4, T3FREE, THYROIDAB in the last 72 hours. Anemia Panel: No results for input(s): VITAMINB12, FOLATE, FERRITIN, TIBC, IRON, RETICCTPCT in the last 72 hours. Urine analysis:    Component Value Date/Time   COLORURINE YELLOW 10/16/2014 2325   APPEARANCEUR CLOUDY (A) 10/16/2014 2325   LABSPEC 1.025 10/09/2018 1451   PHURINE 5.0 10/16/2014 2325   GLUCOSEU 100 (A) 10/16/2014 2325   HGBUR LARGE (A) 10/16/2014 2325   BILIRUBINUR negative 10/09/2018 1451   KETONESUR negative 10/09/2018 1451   KETONESUR NEGATIVE 10/16/2014 2325   PROTEINUR negative 10/09/2018 1451   PROTEINUR NEGATIVE 10/16/2014 2325   UROBILINOGEN 0.2 10/16/2014 2325   NITRITE Negative 10/09/2018 1451   NITRITE NEGATIVE 10/16/2014 2325   LEUKOCYTESUR Negative 10/09/2018 1451    Radiological Exams on Admission - Personally Reviewed: DG Chest 2 View  Result Date: 11/13/2019 CLINICAL DATA:  Chest pain.  Hypertension. EXAM: CHEST - 2 VIEW COMPARISON:  September 06, 2016 FINDINGS: The lungs are clear. The heart size and pulmonary vascularity are normal. No adenopathy. No pneumothorax. No bone lesions. IMPRESSION: Lungs clear.  Cardiac silhouette normal. Electronically Signed   By: Lowella Grip III M.D.   On: 11/13/2019 15:02   CT Head Wo Contrast  Result Date: 11/13/2019 CLINICAL DATA:  Mental status change. Hypertensive emergency. Headache. No reported injury. EXAM: CT HEAD WITHOUT CONTRAST TECHNIQUE: Contiguous axial images were obtained from the base of the skull through the vertex without intravenous contrast.  COMPARISON:  05/18/2015 brain MRI. FINDINGS: Brain: No evidence of parenchymal hemorrhage or extra-axial fluid collection. No mass lesion, mass effect, or midline shift. No CT evidence of acute infarction. Cerebral volume is age appropriate. No ventriculomegaly. Vascular: No acute abnormality. Skull: No evidence of calvarial fracture. Sinuses/Orbits: The visualized paranasal sinuses are essentially clear. Other:  The mastoid air cells are unopacified. IMPRESSION: Negative head CT. No evidence of acute intracranial abnormality. Electronically Signed   By: Ilona Sorrel M.D.   On: 11/13/2019 17:22    EKG: Personally reviewed.  Rhythm is normal sinus rhythm with heart rate of 98 bpm.  No dynamic ST segment changes appreciated.  Assessment/Plan Principal problem:  Hypertensive urgency  Patient presented with markedly elevated blood pressures as high as 220/138 in the setting of shortness of breath, chest discomfort and headache.  Patient has likely been experiencing markedly elevated blood pressures ever since her medications were discontinued in January.  Considering longstanding hypertension due to lack of medications, blood pressure reduction in the first 24 hours is 25%.  Restarting home regimen of antihypertensive therapy in addition to as needed intravenous antihypertensives for markedly elevated blood pressures.  We will then slowly titrate antihypertensive regimen to begin to approach normal blood pressures in the next several days.  Cycling cardiac enzymes  Monitoring patient on telemetry  No evidence of renal injury on chemistry.   Chest x-ray reveals no evidence of pulmonary edema.  Echocardiogram ordered for the morning  Active problems:    Morbid obesity ([place])   Severe obesity is likely contributing factor in the patient's profound hypertension.  Counseling patient on caloric restriction and regular physical activity    Uncontrolled type 2 diabetes mellitus with  hyperglycemia, without long-term current use of insulin [facility])  Patient presenting with initial blood sugar of 369 on arrival to the emergency department  Patient reports longstanding history of diabetes but is only been managed with noninsulin based therapies in the past  Hemoglobin A1c in our system in 2020 is over 11%, highly suggestive that the patient would best be served by an insulin regimen.  Obtaining repeat hemoglobin A1c  Placing patient on a modest regimen of basal bolus insulin therapy with Lantus 20 units nightly and 6 units of NovoLog before every meal.  This will be slowly titrated throughout the hospitalization to achieve improved glycemic control.  Accu-Cheks before every meal and nightly with additional sliding scale insulin.   Code Status:  Full code Family Communication: Husband is at the bedside and has been updated on plan of care. Cyst  Status is: Observation  The patient remains OBS appropriate and will d/c before 2 midnights.  Dispo: The patient is from: Home              Anticipated d/c is to: Home              Anticipated d/c date is: 2 days              Patient currently is not medically stable to d/c.        Vernelle Emerald MD [facility] Pager [phone]  If 7PM-7AM, please contact night-coverage [email or web address] Use universal [place] password for that web site. If you do not have the password, please call the hospital operator.  11/13/2019, 8:28 PM

## 2019-11-13 NOTE — ED Notes (Signed)
Pt requesting med for H/A,

## 2019-11-13 NOTE — ED Triage Notes (Signed)
Patient is here with elevated heart rate, headache and a cough that started yesterday.

## 2019-11-13 NOTE — ED Provider Notes (Signed)
[facility]    CSN: [id number] Arrival date & time: 11/13/19  1206      History   Chief Complaint Chief Complaint  Patient presents with  . Chest Pain    appointment  . Tachycardia    HPI Amy Banks is a 44 y.o. female.   44 year old female with history of HTN, uncontrolled DM comes in for palpitation, tachycardia, headache, chest pressure, shortness of breath.  She started having headache 4 days ago without photophobia, phonophobia.  Some nausea without vomiting.  However, yesterday started having tachycardia and palpitations with chest tightness.  She states shortness of breath is mostly due to cough while she is talking, this resolves if she is not talking.  She denies chest pain.  No one-sided weakness, dizziness, syncope.  She has not taken her blood pressure medicines for the past few months, took losartan-HCTZ, amlodipine this morning at 7 AM.     Past Medical History:  Diagnosis Date  . Anemia   . Anxiety   . Depression   . Diabetes mellitus without complication ([place])   . Diverticulitis 08/2013  . Fibroids    tx w/ hormone meds  . History of blood transfusion 03/2016   at [facility]  . Hypertension   . Infertility, female    on hormone meds  . Morbid obesity [facility])     Patient Active Problem List   Diagnosis Date Noted  . Anxiety and depression 10/14/2018  . Leiomyoma 06/13/2017  . Uncontrolled hypertension 06/04/2017  . Morbid obesity ([place]) 02/13/2017  . Elevated serum glucose with glucosuria 02/13/2017  . Fibroids   . Uncontrolled type 2 diabetes mellitus without complication, without long-term current use of insulin   . 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 Essential hypertension 05/11/2015  . History of anxiety 05/11/2015  . Menorrhagia 01/02/2015  . Diverticulitis of colon without hemorrhage 10/13/2013  . Anemia 10/13/2013    Past Surgical History:  Procedure Laterality Date  . BREAST SURGERY  2001    reduction  . LAPAROTOMY Right 01/02/2015   Procedure: EXPLORATORY LAPAROTOMY WITH RIGHT OVARIAN CYSTECTOMY;  Surgeon: Marylynn Pearson, MD;  Location: [place] [facility];  Service: Gynecology;  Laterality: Right;  . LAPAROTOMY N/A 06/13/2017   Procedure: MINI LAPAROTOMY;  Surgeon: Governor Specking, MD;  Location: [place] [facility];  Service: Gynecology;  Laterality: N/A;  . LYSIS OF ADHESION N/A 01/02/2015   Procedure: LYSIS OF ADHESION;  Surgeon: Marylynn Pearson, MD;  Location: [place] [facility];  Service: Gynecology;  Laterality: N/A;  . LYSIS OF ADHESION N/A 06/13/2017   Procedure: LYSIS OF ADHESION;  Surgeon: Governor Specking, MD;  Location: [place] [facility];  Service: Gynecology;  Laterality: N/A;  . MYOMECTOMY  2009  . ROBOT ASSISTED MYOMECTOMY N/A 06/13/2017   Procedure: ATTEMPTED ROBOTIC ASSISTED MYOMECTOMY;  Surgeon: Governor Specking, MD;  Location: [place] [facility];  Service: Gynecology;  Laterality: N/A;    OB History    Gravida  0   Para  0   Term  0   Preterm  0   AB  0   Living  0     SAB  0   TAB  0   Ectopic  0   Multiple  0   Live Births               Home Medications    Prior to Admission medications   Medication Sig Start Date End Date Taking? Authorizing Provider  ALPRAZolam (XANAX) 0.25 MG tablet TAKE 1 TABLET BY MOUTH  TWICE DAILY AS NEEDED FOR ANXIETY 12/03/17   Henson, Vickie L, NP-C  amLODipine (NORVASC) 5 MG tablet Take 1 tablet (5 mg total) by mouth daily. 12/14/18   Henson, Vickie L, NP-C  Dulaglutide (TRULICITY) 2.99 BZ/1.6RC SOPN Inject 1 application into the skin once a week. 12/14/18   Henson, Vickie L, NP-C  ferrous sulfate 325 (65 FE) MG EC tablet Take 325 mg by mouth daily with breakfast.    [provider]  glucose blood (CONTOUR NEXT TEST) test strip Test twice a day 12/14/18   Raenette Rover, Vickie L, NP-C  ibuprofen (ADVIL,MOTRIN) 200 MG tablet Take 200-800 mg by mouth every 4 (four) hours as needed for moderate pain or cramping (depends on pain level if takes 2-4 tablets).    [provider]  losartan-hydrochlorothiazide (HYZAAR) 100-12.5 MG tablet Take 1 tablet by mouth daily. 12/14/18   Henson, Vickie L, NP-C  metFORMIN (GLUCOPHAGE) 1000 MG tablet Take 1 tablet (1,000 mg total) by mouth 2 (two) times daily with a meal. 12/21/18   Henson, Vickie L, NP-C  miconazole nitrate (MICATIN) POWD Apply 1 application topically 2 (two) times daily as needed. 04/09/19   Hall-Potvin, [place], PA-C  Microlet Lancets MISC Test twice a day 10/09/18   [name], NP-C   Family History Family History  Problem Relation Age of Onset  . Diabetes Mother   . Hypertension Mother   . Diabetes Sister   . Diabetes Maternal Grandmother   . Hypertension Maternal Grandmother   . Hypertension Other   . Diabetes Other   . Breast cancer Other   . Ovarian cancer Other   . Cancer Maternal Aunt     Social History Social History   Tobacco Use  . Smoking status: Never Smoker  . Smokeless tobacco: Never Used  Vaping Use  . Vaping Use: Never used  Substance Use Topics  . Alcohol use: No  . Drug use: No     Allergies   Codeine   Review of Systems Review of Systems  Reason unable to perform ROS: See HPI as above.     Physical Exam Triage Vital Signs ED Triage Vitals  Enc Vitals Group     BP 11/13/19 1220 (!) 204/117     Pulse Rate 11/13/19 1220 (!) 114     Resp 11/13/19 1220 16     Temp 11/13/19 1220 98.2 F (36.8 C)     Temp Source 11/13/19 1220 Oral     SpO2 11/13/19 1220 98 %     Weight --      Height --      Head Circumference --      Peak Flow --      Pain Score 11/13/19 1222 8     Pain Loc --      Pain Edu? --      Excl. in [place]? --    No data found.  Updated Vital Signs BP (!) 210/100 (BP Location: Left Arm)   Pulse 92   Temp 98.2 F (36.8 C) (Oral)   Resp 16   LMP 10/02/2019 (Approximate)   SpO2 98%   Physical Exam Constitutional:      General: She is not in acute distress.    Appearance: Normal appearance. She is well-developed. She is not  toxic-appearing or diaphoretic.  HENT:     Head: Normocephalic and atraumatic.  Eyes:     Conjunctiva/sclera: Conjunctivae normal.     Pupils: Pupils are equal, round, and reactive to light.  Cardiovascular:     Rate and Rhythm: Normal rate and regular rhythm.     Comments: Tachycardia resolved Pulmonary:     Effort: Pulmonary effort is normal. No respiratory distress.     Comments: LCTAB Musculoskeletal:     Cervical back: Normal range of motion and neck supple.  Skin:    General: Skin is warm and dry.  Neurological:     Mental Status: She is alert and oriented to person, place, and time.     Comments: Cranial nerves II-XII grossly intact. Strength 5/5 bilaterally for upper and lower extremity. Sensation intact. Normal coordination with normal finger to nose, heel to shin. Negative pronator drift, romberg. Gait intact. Able to ambulate on own without difficulty.       UC Treatments / Results  Labs (all labs ordered are listed, but only abnormal results are displayed) Labs Reviewed - No data to display  EKG   Radiology No results found.  Procedures Procedures (including critical care time)  Medications Ordered in UC Medications - No data to display  Initial Impression / Assessment and Plan / UC Course  I have reviewed the triage vital signs and the nursing notes.  Pertinent labs & imaging results that were available during my care of the patient were reviewed by me and considered in my medical decision making (see chart for details).    44 year old female with history of uncontrolled diabetes, HTN comes in for 2-day history of chest pressure/tightness, shortness of breath, tachycardia, palpitation.  She also endorses headache, nausea without vomiting.  Denies photophobia, phonophobia.  Denies one-sided weakness, dizziness, syncope.  She has a history of hypertension, but has not taken her medications for the past few months.  Took losartan-HCTZ, amlodipine at 7 AM this  morning.  She was found to be hypertensive at 204/117, HR 114 without tachypnea, hypoxia. EKG sinus tachycardia, 104 bpm, nonspecific T wave abnormality compared to prior EKG.  No acute ST elevation or depression.  During exam, tachycardia resolved, with regular rate and rhythm.  Lungs clear to auscultation bilaterally with adventitious lung sounds. Her neurology exam was grossly intact.  However, recheck BP 210/100 despite resolution of tachycardia. Continued chest pressure/tightness.  Discussed case with Dr. Lanny Cramp, who agrees to ED evaluation from hypertensive emergency, though feels patient is stable enough for private vehicle transfer. Patient discharged in stable condition to the ED for further evaluation via private vehicle. Strict instructions to call EMS if worsening symptoms. Husband and patient expresses understanding and agrees to plan.   Final Clinical Impressions(s) / UC Diagnoses   Final diagnoses:  Hypertensive emergency    ED Prescriptions    None     PDMP not reviewed this encounter.   Ok Edwards, PA-C 11/13/19 1341

## 2019-11-13 NOTE — Discharge Instructions (Addendum)
44 year old female with history of uncontrolled diabetes, HTN comes in for 2-day history of chest pressure/tightness, shortness of breath, tachycardia, palpitation.  She also endorses headache, nausea without vomiting.  Denies photophobia, phonophobia.  Denies one-sided weakness, dizziness, syncope.  She has a history of hypertension, but has not taken her medications for the past few months.  Took losartan-HCTZ, amlodipine at 7 AM this morning.  She was found to be hypertensive at 204/117, HR 114 without tachypnea, hypoxia. EKG sinus tachycardia, 104 bpm, nonspecific T wave abnormality compared to prior EKG.  No acute ST elevation or depression.  During exam, tachycardia resolved, with regular rate and rhythm.  Lungs clear to auscultation bilaterally with adventitious lung sounds. Her neurology exam was grossly intact.  However, recheck BP 210/100 despite resolution of tachycardia. Continued chest pressure/tightness. Therefore discharged to the ED for further evaluation.

## 2019-11-13 NOTE — ED Notes (Signed)
Pt reports she is having a headache EDP notified

## 2019-11-13 NOTE — ED Provider Notes (Signed)
Emergency Department Provider Note   I have reviewed the triage vital signs and the nursing notes.   HISTORY  Chief Complaint Tachycardia   HPI Amy Banks is a 44 y.o. female with PMH of DM, HTN, and elevated BMI presents to the ED with HA x 4 days and new onset CP and SOB with cough. Starting yesterday. She is UTD on vaccines.  Patient's headache began gradually with no clear provoking factors.  She denies any photophobia or phonophobia.  No similar headaches in the past.  She denies any unilateral weakness, numbness, tingling.  She had been on blood pressure medicines in the past but these were discontinued after discussion with her OB/GYN.  She was managing headache symptoms at home but yesterday developed a central tightness in the chest with shortness of breath type symptoms.  She developed a cough as well.  She is noted some mild confusion at times but states that is fairly minimal.  She was found to have elevated heart rate and significantly elevated blood pressure at urgent care and was redirected to the emergency department for evaluation.  Patient did have leftover blood pressure medicine which she took this morning (HCTZ/Lisinopril and Amlodipine).   Past Medical History:  Diagnosis Date  . Anemia   . Anxiety   . Depression   . Diabetes mellitus without complication ([place])   . Diverticulitis 08/2013  . Fibroids    tx w/ hormone meds  . History of blood transfusion 03/2016   at [facility]  . Hypertension   . Infertility, female    on hormone meds  . Morbid obesity [facility])     Patient Active Problem List   Diagnosis Date Noted  . Hypertensive urgency 11/13/2019  . Uncontrolled type 2 diabetes mellitus with hyperglycemia, without Chalsea Darko-term current use of insulin ([place]) 11/13/2019  . Anxiety and depression 10/14/2018  . Leiomyoma 06/13/2017  . Uncontrolled hypertension 06/04/2017  . Morbid obesity ([place]) 02/13/2017  . Elevated serum glucose with glucosuria  02/13/2017  . Fibroids   . Uncontrolled type 2 diabetes mellitus without complication, without Shylo Dillenbeck-term current use of insulin   . 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 Essential hypertension 05/11/2015  . History of anxiety 05/11/2015  . Menorrhagia 01/02/2015  . Diverticulitis of colon without hemorrhage 10/13/2013  . Anemia 10/13/2013    Past Surgical History:  Procedure Laterality Date  . BREAST SURGERY  2001   reduction  . LAPAROTOMY Right 01/02/2015   Procedure: EXPLORATORY LAPAROTOMY WITH RIGHT OVARIAN CYSTECTOMY;  Surgeon: Marylynn Pearson, MD;  Location: [facility];  Service: Gynecology;  Laterality: Right;  . LAPAROTOMY N/A 06/13/2017   Procedure: MINI LAPAROTOMY;  Surgeon: Governor Specking, MD;  Location: [facility];  Service: Gynecology;  Laterality: N/A;  . LYSIS OF ADHESION N/A 01/02/2015   Procedure: LYSIS OF ADHESION;  Surgeon: Marylynn Pearson, MD;  Location: [facility];  Service: Gynecology;  Laterality: N/A;  . LYSIS OF ADHESION N/A 06/13/2017   Procedure: LYSIS OF ADHESION;  Surgeon: Governor Specking, MD;  Location: [facility];  Service: Gynecology;  Laterality: N/A;  . MYOMECTOMY  2009  . ROBOT ASSISTED MYOMECTOMY N/A 06/13/2017   Procedure: ATTEMPTED ROBOTIC ASSISTED MYOMECTOMY;  Surgeon: Governor Specking, MD;  Location: [facility];  Service: Gynecology;  Laterality: N/A;    Allergies Codeine  Family History  Problem Relation Age of Onset  . Diabetes Mother   . Hypertension Mother   . Diabetes Sister   . Diabetes Maternal Grandmother   .  Hypertension Maternal Grandmother   . Hypertension Other   . Diabetes Other   . Breast cancer Other   . Ovarian cancer Other   . Cancer Maternal Aunt     Social History Social History   Tobacco Use  . Smoking status: Never Smoker  . Smokeless tobacco: Never Used  Vaping Use  . Vaping Use: Never used  Substance Use Topics  . Alcohol use: No  . Drug use: No    Review of Systems  Constitutional: No fever/chills Eyes:  No visual changes. ENT: No sore throat. Cardiovascular: Positive chest pain. Respiratory: Positive shortness of breath and cough.  Gastrointestinal: No abdominal pain.  No nausea, no vomiting.  No diarrhea.  No constipation. Genitourinary: Negative for dysuria. Musculoskeletal: Negative for back pain. Skin: Negative for rash. Neurological: Negative for focal weakness or numbness. Positive HA.   10-point ROS otherwise negative.  ____________________________________________   PHYSICAL EXAM:  VITAL SIGNS: ED Triage Vitals  Enc Vitals Group     BP 11/13/19 1405 (!) 220/138     Pulse Rate 11/13/19 1405 96     Resp 11/13/19 1405 16     Temp 11/13/19 1405 98.3 F (36.8 C)     Temp Source 11/13/19 1405 Oral     SpO2 11/13/19 1405 100 %   Constitutional: Alert and oriented. Well appearing and in no acute distress. Eyes: Conjunctivae are normal. PERRL. Head: Atraumatic. Nose: No congestion/rhinnorhea. Mouth/Throat: Mucous membranes are moist.  Neck: No stridor.   Cardiovascular: Normal rate, regular rhythm. Good peripheral circulation. Grossly normal heart sounds.   Respiratory: Normal respiratory effort.  No retractions. Lungs CTAB. Gastrointestinal: Soft and nontender. No distention.  Musculoskeletal: No lower extremity tenderness nor edema. No gross deformities of extremities. Neurologic:  Normal speech and language. No gross focal neurologic deficits are appreciated.  Equal strength in the bilateral upper and lower extremities without sensory deficit.  No pronator drift.  Normal cranial nerve exam 2 through 12.  Skin:  Skin is warm, dry and intact. No rash noted.   ____________________________________________   LABS (all labs ordered are listed, but only abnormal results are displayed)  Labs Reviewed  BASIC METABOLIC PANEL - Abnormal; Notable for the following components:      Result Value   Glucose, Bld 369 (*)    All other components within normal limits  CBC -  Abnormal; Notable for the following components:   RBC 5.34 (*)    All other components within normal limits  HEMOGLOBIN A1C - Abnormal; Notable for the following components:   Hgb A1c MFr Bld 11.7 (*)    All other components within normal limits  CBC WITH DIFFERENTIAL/PLATELET - Abnormal; Notable for the following components:   RBC 5.15 (*)    All other components within normal limits  COMPREHENSIVE METABOLIC PANEL - Abnormal; Notable for the following components:   Glucose, Bld 244 (*)    All other components within normal limits  GLUCOSE, CAPILLARY - Abnormal; Notable for the following components:   Glucose-Capillary 254 (*)    All other components within normal limits  GLUCOSE, CAPILLARY - Abnormal; Notable for the following components:   Glucose-Capillary 238 (*)    All other components within normal limits  GLUCOSE, CAPILLARY - Abnormal; Notable for the following components:   Glucose-Capillary 175 (*)    All other components within normal limits  GLUCOSE, CAPILLARY - Abnormal; Notable for the following components:   Glucose-Capillary 257 (*)    All other components within normal limits  GLUCOSE, CAPILLARY - Abnormal; Notable for the following components:   Glucose-Capillary 220 (*)    All other components within normal limits  CBG MONITORING, ED - Abnormal; Notable for the following components:   Glucose-Capillary 297 (*)    All other components within normal limits  SARS CORONAVIRUS 2 BY RT PCR (HOSPITAL ORDER, [facility])  BRAIN NATRIURETIC PEPTIDE  HIV ANTIBODY (ROUTINE TESTING W REFLEX)  MAGNESIUM  RAPID URINE DRUG SCREEN, HOSP PERFORMED  I-STAT BETA HCG BLOOD, ED ([facility], [facility], [facility] ONLY)  TROPONIN I (HIGH SENSITIVITY)  TROPONIN I (HIGH SENSITIVITY)  TROPONIN I (HIGH SENSITIVITY)   ____________________________________________  EKG  Sinus rhythm with a rate of 98.  Normal PR and QTc intervals.  Narrow QRS with LVH type changes and nonspecific  ST changes.  No STEMI.  EKG is similar to her 2018 tracing.  ____________________________________________  RADIOLOGY  DG Chest 2 View  Result Date: 11/13/2019 CLINICAL DATA:  Chest pain.  Hypertension. EXAM: CHEST - 2 VIEW COMPARISON:  September 06, 2016 FINDINGS: The lungs are clear. The heart size and pulmonary vascularity are normal. No adenopathy. No pneumothorax. No bone lesions. IMPRESSION: Lungs clear.  Cardiac silhouette normal. Electronically Signed   By: Lowella Grip III M.D.   On: 11/13/2019 15:02    ____________________________________________   PROCEDURES  Procedure(s) performed:   Procedures  None ____________________________________________   INITIAL IMPRESSION / ASSESSMENT AND PLAN / ED COURSE  Pertinent labs & imaging results that were available during my care of the patient were reviewed by me and considered in my medical decision making (see chart for details).   Patient presents to the emergency department with concern for possible hypertensive emergency with chest pain, shortness of breath, headache.  She has an intact neurologic exam and her lab work from triage is largely unremarkable with normal high-sensitivity troponin, clear chest x-ray, and otherwise normal lab work.  I have added on a CT head as well as Covid testing with her cough although she is up-to-date on vaccine.  Will give labetalol here.  Considered PE but history is not consistent with this and my suspicion is overall very low.   CT imaging and labs reviewed. HTN difficult to control here and patient is symptomatic. Plan to admit with HTN urgency.   Discussed patient's case with [facility] to request admission. Patient and family (if present) updated with plan. Care transferred to [facility] service.  I reviewed all nursing notes, vitals, pertinent old records, EKGs, labs, imaging (as available).  ____________________________________________  FINAL CLINICAL IMPRESSION(S) / ED DIAGNOSES  Final diagnoses:   Hypertensive urgency     MEDICATIONS GIVEN DURING THIS VISIT:  Medications  sodium chloride flush (NS) 0.9 % injection 3 mL (3 mLs Intravenous Given 11/13/19 2002)  labetalol (NORMODYNE) injection 5 mg (5 mg Intravenous Given 11/13/19 1842)  ketorolac (TORADOL) 30 MG/ML injection 30 mg (30 mg Intravenous Given 11/13/19 1844)  labetalol (NORMODYNE) injection 5 mg (5 mg Intravenous Given 11/13/19 1958)     NEW OUTPATIENT MEDICATIONS STARTED DURING THIS VISIT:  Discharge Medication List as of 11/15/2019 11:03 AM    START taking these medications   Details  blood glucose meter kit and supplies Dispense based on patient and insurance preference. Use up to four times daily as directed. (FOR ICD-10 E10.9, E11.9)., Print    insulin glargine (LANTUS SOLOSTAR) 100 UNIT/ML Solostar Pen Inject 30 Units into the skin daily., Starting Mon 11/15/2019, Normal    Insulin Pen Needle 32G X  4 MM MISC 1 each by Does not apply route daily., Starting Mon 11/15/2019, Until Wed 12/15/2019, Normal        Note:  This document was prepared using Dragon voice recognition software and may include unintentional dictation errors.  Nanda Quinton, MD, [facility]    Janeya Deyo, Wonda Olds, MD 11/16/19 [phone]

## 2019-11-14 ENCOUNTER — Observation Stay (HOSPITAL_BASED_OUTPATIENT_CLINIC_OR_DEPARTMENT_OTHER): Payer: BC Managed Care – PPO

## 2019-11-14 ENCOUNTER — Other Ambulatory Visit: Payer: Self-pay

## 2019-11-14 DIAGNOSIS — I5021 Acute systolic (congestive) heart failure: Secondary | ICD-10-CM | POA: Diagnosis not present

## 2019-11-14 DIAGNOSIS — Z885 Allergy status to narcotic agent status: Secondary | ICD-10-CM | POA: Diagnosis not present

## 2019-11-14 DIAGNOSIS — Z6841 Body Mass Index (BMI) 40.0 and over, adult: Secondary | ICD-10-CM | POA: Diagnosis not present

## 2019-11-14 DIAGNOSIS — F329 Major depressive disorder, single episode, unspecified: Secondary | ICD-10-CM | POA: Diagnosis present

## 2019-11-14 DIAGNOSIS — Z7984 Long term (current) use of oral hypoglycemic drugs: Secondary | ICD-10-CM | POA: Diagnosis not present

## 2019-11-14 DIAGNOSIS — D509 Iron deficiency anemia, unspecified: Secondary | ICD-10-CM | POA: Diagnosis present

## 2019-11-14 DIAGNOSIS — E1165 Type 2 diabetes mellitus with hyperglycemia: Secondary | ICD-10-CM | POA: Diagnosis not present

## 2019-11-14 DIAGNOSIS — R0789 Other chest pain: Secondary | ICD-10-CM | POA: Diagnosis present

## 2019-11-14 DIAGNOSIS — Z713 Dietary counseling and surveillance: Secondary | ICD-10-CM | POA: Diagnosis not present

## 2019-11-14 DIAGNOSIS — F419 Anxiety disorder, unspecified: Secondary | ICD-10-CM | POA: Diagnosis present

## 2019-11-14 DIAGNOSIS — I110 Hypertensive heart disease with heart failure: Secondary | ICD-10-CM | POA: Diagnosis present

## 2019-11-14 DIAGNOSIS — I169 Hypertensive crisis, unspecified: Secondary | ICD-10-CM | POA: Diagnosis present

## 2019-11-14 DIAGNOSIS — Z20822 Contact with and (suspected) exposure to covid-19: Secondary | ICD-10-CM | POA: Diagnosis present

## 2019-11-14 DIAGNOSIS — I160 Hypertensive urgency: Secondary | ICD-10-CM | POA: Diagnosis not present

## 2019-11-14 DIAGNOSIS — Z79899 Other long term (current) drug therapy: Secondary | ICD-10-CM | POA: Diagnosis not present

## 2019-11-14 LAB — GLUCOSE, CAPILLARY
Glucose-Capillary: 254 mg/dL — ABNORMAL HIGH (ref 70–99)
Glucose-Capillary: 238 mg/dL — ABNORMAL HIGH (ref 70–99)
Glucose-Capillary: 175 mg/dL — ABNORMAL HIGH (ref 70–99)
Glucose-Capillary: 257 mg/dL — ABNORMAL HIGH (ref 70–99)

## 2019-11-14 LAB — TROPONIN I (HIGH SENSITIVITY): Troponin I (High Sensitivity): 8 ng/L (ref <18)

## 2019-11-14 LAB — COMPREHENSIVE METABOLIC PANEL
ALT: 16 U/L (ref 0–44)
AST: 17 U/L (ref 15–41)
Albumin: 3.8 g/dL (ref 3.5–5.0)
Alkaline Phosphatase: 103 U/L (ref 38–126)
Anion gap: 12 (ref 5–15)
BUN: 9 mg/dL (ref 6–20)
CO2: 25 mmol/L (ref 22–32)
Calcium: 9.5 mg/dL (ref 8.9–10.3)
Chloride: 100 mmol/L (ref 98–111)
Creatinine, Ser: 0.79 mg/dL (ref 0.44–1.00)
GFR calc Af Amer: >60 mL/min (ref >60)
GFR calc non Af Amer: >60 mL/min (ref >60)
Glucose, Bld: 244 mg/dL — ABNORMAL HIGH (ref 70–99)
Potassium: 3.5 mmol/L (ref 3.5–5.1)
Sodium: 137 mmol/L (ref 135–145)
Total Bilirubin: 0.4 mg/dL (ref 0.3–1.2)
Total Protein: 7.8 g/dL (ref 6.5–8.1)

## 2019-11-14 LAB — CBC WITH DIFFERENTIAL/PLATELET
Abs Immature Granulocytes: 0.02 10*3/uL (ref 0.00–0.07)
Basophils Absolute: 0 10*3/uL (ref 0.0–0.1)
Basophils Relative: 0 %
Eosinophils Absolute: 0 10*3/uL (ref 0.0–0.5)
Eosinophils Relative: 0 %
HCT: 43.8 % (ref 36.0–46.0)
Hemoglobin: 13.8 g/dL (ref 12.0–15.0)
Immature Granulocytes: 0 %
Lymphocytes Relative: 33 %
Lymphs Abs: 3.4 10*3/uL (ref 0.7–4.0)
MCH: 26.8 pg (ref 26.0–34.0)
MCHC: 31.5 g/dL (ref 30.0–36.0)
MCV: 85 fL (ref 80.0–100.0)
Monocytes Absolute: 0.7 10*3/uL (ref 0.1–1.0)
Monocytes Relative: 7 %
Neutro Abs: 6.3 10*3/uL (ref 1.7–7.7)
Neutrophils Relative %: 60 %
Platelets: 368 10*3/uL (ref 150–400)
RBC: 5.15 MIL/uL — ABNORMAL HIGH (ref 3.87–5.11)
RDW: 13.8 % (ref 11.5–15.5)
WBC: 10.4 10*3/uL (ref 4.0–10.5)
nRBC: 0 % (ref 0.0–0.2)

## 2019-11-14 LAB — HEMOGLOBIN A1C
Hgb A1c MFr Bld: 11.7 % — ABNORMAL HIGH (ref 4.8–5.6)
Mean Plasma Glucose: 289.09 mg/dL

## 2019-11-14 LAB — MAGNESIUM: Magnesium: 2 mg/dL (ref 1.7–2.4)

## 2019-11-14 LAB — HIV ANTIBODY (ROUTINE TESTING W REFLEX): HIV Screen 4th Generation wRfx: NONREACTIVE

## 2019-11-14 LAB — ECHOCARDIOGRAM COMPLETE

## 2019-11-14 LAB — BRAIN NATRIURETIC PEPTIDE: B Natriuretic Peptide: 29.5 pg/mL (ref 0.0–100.0)

## 2019-11-14 MED ORDER — HYDRALAZINE HCL 20 MG/ML IJ SOLN
10.0000 mg | Freq: Four times a day (QID) | INTRAMUSCULAR | Status: DC | PRN
  Administered 2019-11-14: 10 mg via INTRAVENOUS
  Filled 2019-11-14: qty 1

## 2019-11-14 MED ORDER — INSULIN GLARGINE 100 UNIT/ML ~~LOC~~ SOLN
30.0000 [IU] | Freq: Every day | SUBCUTANEOUS | Status: DC
  Administered 2019-11-14: 30 [IU] via SUBCUTANEOUS
  Filled 2019-11-14 (×2): qty 0.3

## 2019-11-14 NOTE — Progress Notes (Signed)
   11/14/19 1225  Assess: MEWS Score  Temp 98.4 F (36.9 C)  BP (!) 206/120  Pulse Rate 84  ECG Heart Rate 85  Resp 18  Level of Consciousness Alert  SpO2 95 %  O2 Device Room Air  Assess: MEWS Score  MEWS Temp 0  MEWS Systolic 2  MEWS Pulse 0  MEWS RR 0  MEWS LOC 0  MEWS Score 2  MEWS Score Color Yellow  Assess: if the MEWS score is Yellow or Red  Were vital signs taken at a resting state? Yes  Focused Assessment Documented focused assessment  Early Detection of Sepsis Score *See Row Information* Low  MEWS guidelines implemented *See Row Information* Yes  Treat  MEWS Interventions Escalated (See documentation below)  Take Vital Signs  Increase Vital Sign Frequency  Yellow: Q 2hr X 2 then Q 4hr X 2, if remains yellow, continue Q 4hrs  Escalate  MEWS: Escalate Yellow: discuss with charge nurse/RN and consider discussing with provider and RRT  Notify: Charge Nurse/RN  Name of Charge Nurse/RN Notified Cyril Mourning Cyril Mourning )  Date Charge Nurse/RN Notified 11/14/19  Time Charge Nurse/RN Notified 2902  Notify: Provider  Provider Name/Title Dr. Horris Latino  Date Provider Notified 11/14/19  Time Provider Notified 1115  Notification Type Page  Notification Reason Change in status  Response See new orders  Date of Provider Response 11/14/19  Time of Provider Response [id number]  Document  Patient Outcome Stabilized after interventions  Progress note created (see row info) Yes

## 2019-11-14 NOTE — Discharge Instructions (Addendum)
[facility] Glucose Products:  ReliOnT glucose products raise low blood sugar fast. Tablets are free of fat, caffeine, sodium and gluten. They are portable and easy to carry, making it easier for people with diabetes to BE PREPARED for lows.  Glucose Tablets Available in 6 flavors . 10 ct...................................... $1.00 . 50 ct...................................... $3.98 Glucose Shot..................................$1.48 Glucose Gel....................................$3.44  Alcohol Swabs Alcohol swabs are used to sterilize your injection site. All of our swabs are individually wrapped for maximum safety, convenience and moisture retention. ReliOnT Alcohol Swabs . 100 ct Swabs..............................$1.00 . 400 ct Swabs..............................$3.74  Lancets ReliOnT offers three lancet options conveniently designed to work with almost every lancing device. Each features a protective disk, which guarantees sterility before testing. ReliOnT Lancets . 100 ct Lancets $1.56 . 200 ct Lancets $2.64 Available in Ultra-Thin, Thin & Micro-Thin ReliOnT 2-IN-1 Lancing Device . 50 ct Lancets..................................... $3.44 Available in 30 gauge and 25 gauge ReliOnT Lancing Device....................$5.84  Blood Glucose Monitors ReliOnT offers a full range of blood glucose testing options to provide an accurate, affordable system that meets each person's unique needs and preferences. Prime Meter....................................... $9.00 Prime Test Strips . 25 test strips.................................... $5.00 . 50 test strips.................................... $9.00 . 100 test strips.................................$17.88 Premier BLU Meter  ............  $18.98 Premier Voice Meter  .............  $14.98 Premier Test Strips . 50 test strips.................................... $9.00 . 100 test strips.................................$17.88 Premier [employer]  ............  $19.44 Kit includes: . 50 test strips . 10 lancets . Lancing device . Carry case  Ketone Test Strips . 50 test strips  ................  $6.64    Insulin Delivery ReliOnT syringes and pen needles provide precision technology, comfort and accuracy in insulin delivery at affordable prices. ReliOnT Pen Needles* . 50 ct....................................................$9.00 Available in 61m, 682m 13m413m 65m46m

## 2019-11-14 NOTE — Progress Notes (Signed)
Inpatient Diabetes Program Recommendations  AACE/ADA: New Consensus Statement on Inpatient Glycemic Control (2015)  Target Ranges:  Prepandial:   less than 140 mg/dL      Peak postprandial:   less than 180 mg/dL (1-2 hours)      Critically ill patients:  140 - 180 mg/dL   Lab Results  Component Value Date   GLUCAP 254 (H) 11/14/2019   HGBA1C 11.7 (H) 11/14/2019    Review of Glycemic Control  Diabetes history: DM 2 Outpatient Diabetes medications: Metformin 1000 mg bid Current orders for Inpatient glycemic control:  Lantus 20 units qhs Novolog 0-20 units tid and hs Novolog 6 units tid meal coverage  Inpatient Diabetes Program Recommendations:    Glucose 254 after Lantus 20 given.  -  Increase Lantus to 30 units.  A1c 11.7%. Pt to benefit from daily basal insulin injection at time of d/c.   Has [employer]. Has only had oral DM medications in the past. Will be seen by DM coordinator tomorrow 6/14 to discuss insulin pen as DM coordinator works remotely over the weekend but is available by phone and pager. Please call if needed before then.  Thanks,  Tama Headings RN, MSN, BC-ADM Inpatient Diabetes Coordinator Team Pager [phone] (8a-5p)

## 2019-11-14 NOTE — Progress Notes (Signed)
PROGRESS NOTE  Amy Banks:[id number] DOB: May 05, 1976 DOA: 11/13/2019 PCP: [name], NP-C  HPI/Recap of past 24 hours: HPI from Dr Amy Banks 44 year old female with past medical history of HTN, obesity, DM type 2, iron deficiency anemia, menorrhagia who presents to the ED c/o headache and chest pressure, SOB that has been ongoing for the past couple of days. Patient explains that in January all of her medications were stopped by her OBGYN provider after she was told that her blood pressures were "normal" without any medication. Pt denies any other new complaints. In the ED, patient was found to have markedly elevated blood pressures as high as 220/138. Troponin and EKG were unremarkable. Patient was administered intravenous labetalol. TRH admitted for further management.   Today, pt still reports intermittent headcahes, denies any further chest pressure or SOB, N/V, abdominal pain, fever/chills, no obvious focal neurologic deficit.   Assessment/Plan: Principal Problem:   Hypertensive urgency Active Problems:   Morbid obesity ([place])   Uncontrolled type 2 diabetes mellitus with hyperglycemia, without long-term current use of insulin ([place])   Hypertensive crisis Still uncontrolled BP as high as 220/138 upon admission BP meds were stopped by OBGYN due to a normal reading in the office Trops negative, EKG with no acute ST changes UDS pending BNP 29.5 CXR unremarkable  ECHO with EF of 55-60%, no RWMA, LV diastolic parameters were normal, severe LVH Continue home meds, amlodipine, losartan, HCTZ, IV hydralazine prn Telemetry, monitor closely  DM type 2 Uncontrolled Last A1c 11.7 SSI, lantus, accuchecks, hypoglycemic protocol Hold home regimen  Morbid obesity Lifestyle modification advised        Malnutrition Type:      Malnutrition Characteristics:      Nutrition Interventions:       Estimated body mass index is 51.88 kg/m as calculated from the  following:   Height as of 10/14/18: 5\' 6"  (1.676 m).   Weight as of 10/14/18: 145.8 kg.     Code Status: Full  Family Communication: Husband via phone  Disposition Plan: Status is: Observation  The patient will require care spanning > 2 midnights and should be moved to inpatient because: Inpatient level of care appropriate due to severity of illness  Dispo: The patient is from: Home              Anticipated d/c is to: Home              Anticipated d/c date is: 1 day              Patient currently is not medically stable to d/c.     Consultants:  None  Procedures:  None  Antimicrobials:  None  DVT prophylaxis: Lovenox   Objective: Vitals:   11/14/19 0730 11/14/19 1100 11/14/19 1225 11/14/19 1253  BP: (!) 148/87 (!) 161/95 (!) 206/120 (!) 174/100  Pulse: 93 87 84   Resp: 20 20 18    Temp: 98.4 F (36.9 C) 98.3 F (36.8 C) 98.4 F (36.9 C)   TempSrc: Oral Oral    SpO2: 97% 97% 95%     Intake/Output Summary (Last 24 hours) at 11/14/2019 1322 Last data filed at 11/14/2019 9373 Gross per 24 hour  Intake 240 ml  Output --  Net 240 ml   There were no vitals filed for this visit.  Exam:  General: NAD, obese   Cardiovascular: S1, S2 present  Respiratory: CTAB  Abdomen: Soft, nontender, nondistended, bowel sounds present  Musculoskeletal: No bilateral pedal edema noted  Skin: Normal  Psychiatry: Normal mood    Data Reviewed: CBC: Recent Labs  Lab 11/13/19 1413 11/14/19 0500  WBC 9.6 10.4  NEUTROABS  --  6.3  HGB 14.3 13.8  HCT 45.9 43.8  MCV 86.0 85.0  PLT 362 902   Basic Metabolic Panel: Recent Labs  Lab 11/13/19 1413 11/14/19 0040  NA 138 137  K 4.4 3.5  CL 99 100  CO2 29 25  GLUCOSE 369* 244*  BUN 9 9  CREATININE 0.83 0.79  CALCIUM 9.9 9.5  MG  --  2.0   GFR: CrCl cannot be calculated (Unknown ideal weight.). Liver Function Tests: Recent Labs  Lab 11/14/19 0040  AST 17  ALT 16  ALKPHOS 103  BILITOT 0.4  PROT 7.8   ALBUMIN 3.8   No results for input(s): LIPASE, AMYLASE in the last 168 hours. No results for input(s): AMMONIA in the last 168 hours. Coagulation Profile: No results for input(s): INR, PROTIME in the last 168 hours. Cardiac Enzymes: No results for input(s): CKTOTAL, CKMB, CKMBINDEX, TROPONINI in the last 168 hours. BNP (last 3 results) No results for input(s): PROBNP in the last 8760 hours. HbA1C: Recent Labs    11/14/19 0102  HGBA1C 11.7*   CBG: Recent Labs  Lab 11/13/19 2041 11/14/19 0734 11/14/19 1208  GLUCAP 297* 254* 238*   Lipid Profile: No results for input(s): CHOL, HDL, LDLCALC, TRIG, CHOLHDL, LDLDIRECT in the last 72 hours. Thyroid Function Tests: No results for input(s): TSH, T4TOTAL, FREET4, T3FREE, THYROIDAB in the last 72 hours. Anemia Panel: No results for input(s): VITAMINB12, FOLATE, FERRITIN, TIBC, IRON, RETICCTPCT in the last 72 hours. Urine analysis:    Component Value Date/Time   COLORURINE YELLOW 10/16/2014 2325   APPEARANCEUR CLOUDY (A) 10/16/2014 2325   LABSPEC 1.025 10/09/2018 1451   PHURINE 5.0 10/16/2014 2325   GLUCOSEU 100 (A) 10/16/2014 2325   HGBUR LARGE (A) 10/16/2014 2325   BILIRUBINUR negative 10/09/2018 1451   KETONESUR negative 10/09/2018 1451   KETONESUR NEGATIVE 10/16/2014 2325   PROTEINUR negative 10/09/2018 1451   PROTEINUR NEGATIVE 10/16/2014 2325   UROBILINOGEN 0.2 10/16/2014 2325   NITRITE Negative 10/09/2018 1451   NITRITE NEGATIVE 10/16/2014 2325   LEUKOCYTESUR Negative 10/09/2018 1451   Sepsis Labs: @LABRCNTIP (procalcitonin:4,lacticidven:4)  ) Recent Results (from the past 240 hour(s))  SARS Coronavirus 2 by RT PCR (hospital order, performed in [place] hospital lab) Nasopharyngeal Nasopharyngeal Swab     Status: None   Collection Time: 11/13/19  6:12 PM   Specimen: Nasopharyngeal Swab  Result Value Ref Range Status   SARS Coronavirus 2 NEGATIVE NEGATIVE Final    Comment: (NOTE) SARS-CoV-2 target nucleic  acids are NOT DETECTED.  The SARS-CoV-2 RNA is generally detectable in upper and lower respiratory specimens during the acute phase of infection. The lowest concentration of SARS-CoV-2 viral copies this assay can detect is 250 copies / mL. A negative result does not preclude SARS-CoV-2 infection and should not be used as the sole basis for treatment or other patient management decisions.  A negative result may occur with improper specimen collection / handling, submission of specimen other than nasopharyngeal swab, presence of viral mutation(s) within the areas targeted by this assay, and inadequate number of viral copies (<250 copies / mL). A negative result must be combined with clinical observations, patient history, and epidemiological information.  Fact Sheet for Patients:   StrictlyIdeas.no  Fact Sheet for Healthcare Providers: BankingDealers.co.za  This test is not yet approved or  cleared by  the [employer] and has been authorized for detection and/or diagnosis of SARS-CoV-2 by FDA under an Emergency Use Authorization (EUA).  This EUA will remain in effect (meaning this test can be used) for the duration of the COVID-19 declaration under Section 564(b)(1) of the Act, 21 U.S.C. section 360bbb-3(b)(1), unless the authorization is terminated or revoked sooner.  Performed at [facility], [place] [address], [place], [place] [address]       Studies: DG Chest 2 View  Result Date: 11/13/2019 CLINICAL DATA:  Chest pain.  Hypertension. EXAM: CHEST - 2 VIEW COMPARISON:  September 06, 2016 FINDINGS: The lungs are clear. The heart size and pulmonary vascularity are normal. No adenopathy. No pneumothorax. No bone lesions. IMPRESSION: Lungs clear.  Cardiac silhouette normal. Electronically Signed   By: Lowella Grip III M.D.   On: 11/13/2019 15:02   CT Head Wo Contrast  Result Date: 11/13/2019 CLINICAL DATA:  Mental status  change. Hypertensive emergency. Headache. No reported injury. EXAM: CT HEAD WITHOUT CONTRAST TECHNIQUE: Contiguous axial images were obtained from the base of the skull through the vertex without intravenous contrast. COMPARISON:  05/18/2015 brain MRI. FINDINGS: Brain: No evidence of parenchymal hemorrhage or extra-axial fluid collection. No mass lesion, mass effect, or midline shift. No CT evidence of acute infarction. Cerebral volume is age appropriate. No ventriculomegaly. Vascular: No acute abnormality. Skull: No evidence of calvarial fracture. Sinuses/Orbits: The visualized paranasal sinuses are essentially clear. Other:  The mastoid air cells are unopacified. IMPRESSION: Negative head CT. No evidence of acute intracranial abnormality. Electronically Signed   By: Ilona Sorrel M.D.   On: 11/13/2019 17:22   ECHOCARDIOGRAM COMPLETE  Result Date: 11/14/2019    ECHOCARDIOGRAM REPORT   Patient Name:   Amy Banks [place] Date of Exam: 11/14/2019 Medical Rec #:  [id number]        Height:       66.0 in Accession #:    [id number]       Weight:       321.4 lb Date of Birth:  01/05/1976         BSA:          2.446 m Patient Age:    69 years         BP:           147/87 mmHg Patient Gender: F                HR:           93 bpm. Exam Location:  Inpatient Procedure: 2D Echo, Cardiac Doppler and Color Doppler Indications:    CHF-Acute systolic  History:        Patient has no prior history of Echocardiogram examinations.                 Risk Factors:Hypertension and Diabetes.  Sonographer:    Clayton Lefort RDCS (AE) Referring Phys: [id number] Sherryll Burger [facility]  Sonographer Comments: Patient is morbidly obese. Image acquisition challenging due to patient body habitus. IMPRESSIONS  1. Left ventricular ejection fraction, by estimation, is 55 to 60%. The left ventricle has normal function. The left ventricle has no regional wall motion abnormalities. There is severe left ventricular hypertrophy. Left ventricular diastolic parameters   were normal.  2. Right ventricular systolic function is normal. The right ventricular size is normal.  3. The mitral valve is normal in structure. Trivial mitral valve regurgitation. No evidence of mitral stenosis.  4. The aortic valve is tricuspid. Aortic  valve regurgitation is not visualized. Mild aortic valve sclerosis is present, with no evidence of aortic valve stenosis.  5. The inferior vena cava is normal in size with greater than 50% respiratory variability, suggesting right atrial pressure of 3 mmHg. FINDINGS  Left Ventricle: Left ventricular ejection fraction, by estimation, is 55 to 60%. The left ventricle has normal function. The left ventricle has no regional wall motion abnormalities. The left ventricular internal cavity size was normal in size. There is  severe left ventricular hypertrophy. Left ventricular diastolic parameters were normal. Right Ventricle: The right ventricular size is normal. No increase in right ventricular wall thickness. Right ventricular systolic function is normal. Left Atrium: Left atrial size was normal in size. Right Atrium: Right atrial size was normal in size. Pericardium: There is no evidence of pericardial effusion. Mitral Valve: The mitral valve is normal in structure. There is mild thickening of the mitral valve leaflet(s). There is mild calcification of the mitral valve leaflet(s). Normal mobility of the mitral valve leaflets. Trivial mitral valve regurgitation. No evidence of mitral valve stenosis. MV peak gradient, 7.6 mmHg. The mean mitral valve gradient is 2.0 mmHg. Tricuspid Valve: The tricuspid valve is normal in structure. Tricuspid valve regurgitation is not demonstrated. No evidence of tricuspid stenosis. Aortic Valve: The aortic valve is tricuspid. Aortic valve regurgitation is not visualized. Mild aortic valve sclerosis is present, with no evidence of aortic valve stenosis. Aortic valve mean gradient measures 7.0 mmHg. Aortic valve peak gradient measures  13.5 mmHg. Aortic valve area, by VTI measures 2.58 cm. Pulmonic Valve: The pulmonic valve was normal in structure. Pulmonic valve regurgitation is not visualized. No evidence of pulmonic stenosis. Aorta: The aortic root is normal in size and structure. Venous: The inferior vena cava is normal in size with greater than 50% respiratory variability, suggesting right atrial pressure of 3 mmHg. IAS/Shunts: No atrial level shunt detected by color flow Doppler.  LEFT VENTRICLE PLAX 2D LVIDd:         3.50 cm  Diastology LVIDs:         2.40 cm  LV e' lateral:   7.40 cm/s LV PW:         1.90 cm  LV E/e' lateral: 9.6 LV IVS:        1.90 cm  LV e' medial:    6.42 cm/s LVOT diam:     2.10 cm  LV E/e' medial:  11.1 LV SV:         82 LV SV Index:   34 LVOT Area:     3.46 cm  RIGHT VENTRICLE RV Basal diam:  1.90 cm RV S prime:     11.10 cm/s TAPSE (M-mode): 2.2 cm LEFT ATRIUM             Index       RIGHT ATRIUM           Index LA diam:        2.80 cm 1.14 cm/m  RA Area:     10.10 cm LA Vol (A2C):   57.8 ml 23.63 ml/m RA Volume:   19.40 ml  7.93 ml/m LA Vol (A4C):   59.4 ml 24.28 ml/m LA Biplane Vol: 60.8 ml 24.85 ml/m  AORTIC VALVE AV Area (Vmax):    2.18 cm AV Area (Vmean):   2.14 cm AV Area (VTI):     2.58 cm AV Vmax:           184.00 cm/s AV Vmean:  122.000 cm/s AV VTI:            0.320 m AV Peak Grad:      13.5 mmHg AV Mean Grad:      7.0 mmHg LVOT Vmax:         116.00 cm/s LVOT Vmean:        75.400 cm/s LVOT VTI:          0.238 m LVOT/AV VTI ratio: 0.74  AORTA Ao Root diam: 3.00 cm Ao Asc diam:  3.30 cm MITRAL VALVE MV Area (PHT): 2.99 cm    SHUNTS MV Peak grad:  7.6 mmHg    Systemic VTI:  0.24 m MV Mean grad:  2.0 mmHg    Systemic Diam: 2.10 cm MV Vmax:       1.38 m/s MV Vmean:      70.9 cm/s MV Decel Time: 254 msec MV E velocity: 71.10 cm/s MV A velocity: 81.80 cm/s MV E/A ratio:  0.87 Jenkins Rouge MD Electronically signed by Jenkins Rouge MD Signature Date/Time: 11/14/2019/10:53:55 AM    Final      Scheduled Meds: . amLODipine  10 mg Oral Daily  . enoxaparin (LOVENOX) injection  40 mg Subcutaneous Q24H  . losartan  100 mg Oral Daily   And  . hydrochlorothiazide  12.5 mg Oral Daily  . insulin aspart  0-20 Units Subcutaneous TID AC & HS  . insulin aspart  6 Units Subcutaneous TID WC  . insulin glargine  30 Units Subcutaneous QHS    Continuous Infusions:   LOS: 0 days     Alma Friendly, MD [facility]  If 7PM-7AM, please contact night-coverage [email or web address] 11/14/2019, 1:22 PM

## 2019-11-14 NOTE — Progress Notes (Signed)
Inpatient Diabetes Program Recommendations  AACE/ADA: New Consensus Statement on Inpatient Glycemic Control (2015)  Target Ranges:  Prepandial:   less than 140 mg/dL      Peak postprandial:   less than 180 mg/dL (1-2 hours)      Critically ill patients:  140 - 180 mg/dL   Lab Results  Component Value Date   GLUCAP 238 (H) 11/14/2019   HGBA1C 11.7 (H) 11/14/2019    Review of Glycemic Control  spoek with pt over the phone regarding A1c level and glucose control at home. Pt reports seeing Harland Dingwall at [facility], but has not had an appointment this year. Pt reports working with her to control her glucose levels and has been on Trulicity in the past (similar operation to insulin pen).  Pt does not have a glucose meter at home and will need one at time of d/c. Pt reports already practicing insulin injections and glucose checks.  Discussed insulin pen, checking CBGs (fasting and alternating second check writing down trends for PCP appt), and hypoglycemia (s/s and treatment).  Discussed glucose and A1c goals.  Pt reports her mother also takes insulin. Discussed with pt to go onto the lantus website and fill out form for savings card.  Emailed videos on operation of insulin pen, CBG checks, hypoglycemia to pt via ([email or web address]).   D/c meds: Glucose meter kit order #[id number] Lantus solostar insulin pen order # [id number] Insulin pen needles order # [id number]  Thanks,  Tama Headings RN, MSN, BC-ADM Inpatient Diabetes Coordinator Team Pager [phone] (8a-5p)

## 2019-11-14 NOTE — Progress Notes (Signed)
°  Echocardiogram 2D Echocardiogram has been performed.  Amy Banks 11/14/2019, 9:23 AM

## 2019-11-15 LAB — RAPID URINE DRUG SCREEN, HOSP PERFORMED
Amphetamines: NOT DETECTED
Barbiturates: NOT DETECTED
Benzodiazepines: NOT DETECTED
Cocaine: NOT DETECTED
Opiates: NOT DETECTED
Tetrahydrocannabinol: NOT DETECTED

## 2019-11-15 LAB — GLUCOSE, CAPILLARY: Glucose-Capillary: 220 mg/dL — ABNORMAL HIGH (ref 70–99)

## 2019-11-15 MED ORDER — LANTUS SOLOSTAR 100 UNIT/ML ~~LOC~~ SOPN: 30.0000 [IU] | PEN_INJECTOR | Freq: Every day | SUBCUTANEOUS | 0 refills | Status: DC

## 2019-11-15 MED ORDER — BLOOD GLUCOSE METER KIT: PACK | 0 refills | Status: DC

## 2019-11-15 MED ORDER — LOSARTAN POTASSIUM-HCTZ 100-12.5 MG PO TABS: 1.0000 | ORAL_TABLET | Freq: Every day | ORAL | 0 refills | Status: DC

## 2019-11-15 MED ORDER — INSULIN PEN NEEDLE 32G X 4 MM MISC: 1.0000 | Freq: Every day | 0 refills | Status: DC

## 2019-11-15 MED ORDER — AMLODIPINE BESYLATE 10 MG PO TABS: 10.0000 mg | ORAL_TABLET | Freq: Every day | ORAL | 0 refills | Status: DC

## 2019-11-15 MED ORDER — METFORMIN HCL 1000 MG PO TABS
1000.0000 mg | ORAL_TABLET | Freq: Two times a day (BID) | ORAL | 0 refills | Status: DC
Start: 2019-11-15 — End: 2019-11-17

## 2019-11-15 NOTE — Plan of Care (Signed)
Independent with adl's

## 2019-11-15 NOTE — Progress Notes (Signed)
Inpatient Diabetes Program Recommendations  AACE/ADA: New Consensus Statement on Inpatient Glycemic Control (2015)  Target Ranges:  Prepandial:   less than 140 mg/dL      Peak postprandial:   less than 180 mg/dL (1-2 hours)      Critically ill patients:  140 - 180 mg/dL   Lab Results  Component Value Date   GLUCAP 220 (H) 11/15/2019   HGBA1C 11.7 (H) 11/14/2019    Review of Glycemic Control Results for Amy Banks, Amy Banks (MRN [id number]) as of 11/15/2019 11:39  Ref. Range 11/14/2019 07:34 11/14/2019 12:08 11/14/2019 15:59 11/14/2019 21:20 11/15/2019 07:52  Glucose-Capillary Latest Ref Range: 70 - 99 mg/dL 254 (H) 238 (H) 175 (H) 257 (H) 220 (H)   Diabetes history: DM 2 Outpatient Diabetes medications: None Current orders for Inpatient glycemic control:  Novolog resistant tid with meals and HS Novolog 6 units tid with meals Lantus 30 units q HS  Inpatient Diabetes Program Recommendations:    Spoke to patient regarding discharge.  She states that she feels comfortable with insulin and discharge plan. Encouraged close monitoring of blood sugars and for her to write down to take to PCP.  Also discussed that she may want to talk to PCP about seeing endocrinologist as well?  We reviewed normal blood sugar levels as well as hypoglycemia. 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  Will also place "Outpatient DM education consult" per standing order/cosign required.   Thanks  Adah Perl, RN, BC-ADM Inpatient Diabetes Coordinator Pager [phone] (8a-5p)

## 2019-11-15 NOTE — Discharge Summary (Signed)
Discharge Summary  Amy Banks EOF:[id number] DOB: 1975-09-02  PCP: [name], NP-C  Admit date: 11/13/2019 Discharge date: 11/15/2019  Time spent: 40 mins  Recommendations for Outpatient Follow-up:  1. PCP in 1 week with BP and CBG checks for adjustments prn    Discharge Diagnoses:  Active Hospital Problems   Diagnosis Date Noted  . Hypertensive urgency 11/13/2019  . Uncontrolled type 2 diabetes mellitus with hyperglycemia, without long-term current use of insulin ([place]) 11/13/2019  . Morbid obesity ([place]) 02/13/2017    Resolved Hospital Problems  No resolved problems to display.    Discharge Condition: Stable  Diet recommendation: Heart healthy/modified carbs  Vitals:   11/15/19 0608 11/15/19 0755  BP: (!) 155/80 (!) 152/100  Pulse: 87 82  Resp: 20 18  Temp: 98.7 F (37.1 C) 98.5 F (36.9 C)  SpO2: 97% 98%    History of present illness:  44 year old female with past medical history of HTN, obesity, DM type 2, iron deficiency anemia, menorrhagia who presents to the ED c/o headache and chest pressure, SOB that has been ongoing for the past couple of days. Patient explains that in January all of her medications were stopped by her OBGYN provider after she was told that her blood pressures were "normal" without any medication. Pt denies any other new complaints. In the ED, patient was found to have markedly elevated blood pressures as high as 220/138. Troponin and EKG were unremarkable. Patient was administered intravenous labetalol. TRH admitted for further management.    Today, pt denies any new complaints, had an extensive conversation with patient about lifestyle modification, advised to be compliant with medications, appointments, modify her diet and stay very active. Keep a BP and CBG log and present it to PCP during follow up for further adjustments prn. Pt verbalized understanding.    Hospital Course:  Principal Problem:   Hypertensive urgency Active  Problems:   Morbid obesity ([place])   Uncontrolled type 2 diabetes mellitus with hyperglycemia, without long-term current use of insulin ([place])   Hypertensive crisis BP better, improving BP as high as 220/138 upon admission BP meds were stopped by OBGYN due to a "normal reading in the office" as per patient Trops negative, EKG with no acute ST changes UDS unremarkable BNP 29.5 CXR unremarkable  ECHO with EF of 55-60%, no RWMA, LV diastolic parameters were normal, severe LVH Continue home meds, amlodipine, losartan, HCTZ Follow up with PCP in 1 week as discussed  DM type 2 Uncontrolled Last A1c 11.7 Discharge home on lantus 30U daily, continue metformin BID, PCP to decide on when to re-start trulicity Advised to keep a CBG log (glucometer and testing kit ordered for pt)  Morbid obesity Lifestyle modification advised        Malnutrition Type:      Malnutrition Characteristics:      Nutrition Interventions:      Estimated body mass index is 49.1 kg/m as calculated from the following:   Height as of this encounter: '5\' 6"'  (1.676 m).   Weight as of this encounter: 138 kg.    Procedures:  None  Consultations:  DM coordinator  Discharge Exam: BP (!) 152/100 (BP Location: Right Arm)   Pulse 82   Temp 98.5 F (36.9 C) (Oral)   Resp 18   Ht '5\' 6"'  (1.676 m)   Wt (!) 138 kg   LMP 10/15/2019 (Within Days)   SpO2 98%   BMI 49.10 kg/m   General: NAD Cardiovascular: S1, S2 present Respiratory:  CTAB   Discharge Instructions You were cared for by a hospitalist during your hospital stay. If you have any questions about your discharge medications or the care you received while you were in the hospital after you are discharged, you can call the unit and asked to speak with the hospitalist on call if the hospitalist that took care of you is not available. Once you are discharged, your primary care physician will handle any further medical issues. Please note that NO  REFILLS for any discharge medications will be authorized once you are discharged, as it is imperative that you return to your primary care physician (or establish a relationship with a primary care physician if you do not have one) for your aftercare needs so that they can reassess your need for medications and monitor your lab values.  Discharge Instructions    Ambulatory referral to Nutrition and Diabetic Education   Complete by: As directed    A1C=11.7%- new to insulin   Diet - low sodium heart healthy   Complete by: As directed    Increase activity slowly   Complete by: As directed      Allergies as of 11/15/2019      Reactions   Codeine Hives, Itching      Medication List    STOP taking these medications   Contour Next Test test strip Generic drug: glucose blood   Microlet Lancets Misc   Trulicity 6.57 QI/6.9GE Sopn Generic drug: Dulaglutide     TAKE these medications   ALPRAZolam 0.25 MG tablet Commonly known as: XANAX TAKE 1 TABLET BY MOUTH TWICE DAILY AS NEEDED FOR ANXIETY What changed:   when to take this  reasons to take this  additional instructions   amLODipine 10 MG tablet Commonly known as: NORVASC Take 1 tablet (10 mg total) by mouth daily. What changed:   medication strength  how much to take   blood glucose meter kit and supplies Dispense based on patient and insurance preference. Use up to four times daily as directed. (FOR ICD-10 E10.9, E11.9).   Insulin Pen Needle 32G X 4 MM Misc 1 each by Does not apply route daily.   Lantus SoloStar 100 UNIT/ML Solostar Pen Generic drug: insulin glargine Inject 30 Units into the skin daily.   losartan-hydrochlorothiazide 100-12.5 MG tablet Commonly known as: HYZAAR Take 1 tablet by mouth daily.   metFORMIN 1000 MG tablet Commonly known as: GLUCOPHAGE Take 1 tablet (1,000 mg total) by mouth 2 (two) times daily with a meal. What changed: when to take this   miconazole nitrate Powd Commonly known  as: MICATIN Apply 1 application topically 2 (two) times daily as needed.   VITAMIN D PO Take 1 tablet by mouth daily. gummies      Allergies  Allergen Reactions  . Codeine Hives and Itching    Follow-up Information    Henson, Vickie L, NP-C. Schedule an appointment as soon as possible for a visit in 1 week(s).   Specialty: Family Medicine Contact information: [place] [address] [phone]                The results of significant diagnostics from this hospitalization (including imaging, microbiology, ancillary and laboratory) are listed below for reference.    Significant Diagnostic Studies: DG Chest 2 View  Result Date: 11/13/2019 CLINICAL DATA:  Chest pain.  Hypertension. EXAM: CHEST - 2 VIEW COMPARISON:  September 06, 2016 FINDINGS: The lungs are clear. The heart size and pulmonary vascularity are  normal. No adenopathy. No pneumothorax. No bone lesions. IMPRESSION: Lungs clear.  Cardiac silhouette normal. Electronically Signed   By: Lowella Grip III M.D.   On: 11/13/2019 15:02   CT Head Wo Contrast  Result Date: 11/13/2019 CLINICAL DATA:  Mental status change. Hypertensive emergency. Headache. No reported injury. EXAM: CT HEAD WITHOUT CONTRAST TECHNIQUE: Contiguous axial images were obtained from the base of the skull through the vertex without intravenous contrast. COMPARISON:  05/18/2015 brain MRI. FINDINGS: Brain: No evidence of parenchymal hemorrhage or extra-axial fluid collection. No mass lesion, mass effect, or midline shift. No CT evidence of acute infarction. Cerebral volume is age appropriate. No ventriculomegaly. Vascular: No acute abnormality. Skull: No evidence of calvarial fracture. Sinuses/Orbits: The visualized paranasal sinuses are essentially clear. Other:  The mastoid air cells are unopacified. IMPRESSION: Negative head CT. No evidence of acute intracranial abnormality. Electronically Signed   By: Ilona Sorrel M.D.   On: 11/13/2019  17:22   ECHOCARDIOGRAM COMPLETE  Result Date: 11/14/2019    ECHOCARDIOGRAM REPORT   Patient Name:   LEAHA CUERVO Levitt Date of Exam: 11/14/2019 Medical Rec #:  [id number]        Height:       66.0 in Accession #:    [id number]       Weight:       321.4 lb Date of Birth:  April 26, 1976         BSA:          2.446 m Patient Age:    18 years         BP:           147/87 mmHg Patient Gender: F                HR:           93 bpm. Exam Location:  Inpatient Procedure: 2D Echo, Cardiac Doppler and Color Doppler Indications:    CHF-Acute systolic  History:        Patient has no prior history of Echocardiogram examinations.                 Risk Factors:Hypertension and Diabetes.  Sonographer:    Clayton Lefort RDCS (AE) Referring Phys: [id number] [facility]  Sonographer Comments: Patient is morbidly obese. Image acquisition challenging due to patient body habitus. IMPRESSIONS  1. Left ventricular ejection fraction, by estimation, is 55 to 60%. The left ventricle has normal function. The left ventricle has no regional wall motion abnormalities. There is severe left ventricular hypertrophy. Left ventricular diastolic parameters  were normal.  2. Right ventricular systolic function is normal. The right ventricular size is normal.  3. The mitral valve is normal in structure. Trivial mitral valve regurgitation. No evidence of mitral stenosis.  4. The aortic valve is tricuspid. Aortic valve regurgitation is not visualized. Mild aortic valve sclerosis is present, with no evidence of aortic valve stenosis.  5. The inferior vena cava is normal in size with greater than 50% respiratory variability, suggesting right atrial pressure of 3 mmHg. FINDINGS  Left Ventricle: Left ventricular ejection fraction, by estimation, is 55 to 60%. The left ventricle has normal function. The left ventricle has no regional wall motion abnormalities. The left ventricular internal cavity size was normal in size. There is  severe left ventricular hypertrophy.  Left ventricular diastolic parameters were normal. Right Ventricle: The right ventricular size is normal. No increase in right ventricular wall thickness. Right ventricular systolic function is normal. Left Atrium: Left atrial  size was normal in size. Right Atrium: Right atrial size was normal in size. Pericardium: There is no evidence of pericardial effusion. Mitral Valve: The mitral valve is normal in structure. There is mild thickening of the mitral valve leaflet(s). There is mild calcification of the mitral valve leaflet(s). Normal mobility of the mitral valve leaflets. Trivial mitral valve regurgitation. No evidence of mitral valve stenosis. MV peak gradient, 7.6 mmHg. The mean mitral valve gradient is 2.0 mmHg. Tricuspid Valve: The tricuspid valve is normal in structure. Tricuspid valve regurgitation is not demonstrated. No evidence of tricuspid stenosis. Aortic Valve: The aortic valve is tricuspid. Aortic valve regurgitation is not visualized. Mild aortic valve sclerosis is present, with no evidence of aortic valve stenosis. Aortic valve mean gradient measures 7.0 mmHg. Aortic valve peak gradient measures 13.5 mmHg. Aortic valve area, by VTI measures 2.58 cm. Pulmonic Valve: The pulmonic valve was normal in structure. Pulmonic valve regurgitation is not visualized. No evidence of pulmonic stenosis. Aorta: The aortic root is normal in size and structure. Venous: The inferior vena cava is normal in size with greater than 50% respiratory variability, suggesting right atrial pressure of 3 mmHg. IAS/Shunts: No atrial level shunt detected by color flow Doppler.  LEFT VENTRICLE PLAX 2D LVIDd:         3.50 cm  Diastology LVIDs:         2.40 cm  LV e' lateral:   7.40 cm/s LV PW:         1.90 cm  LV E/e' lateral: 9.6 LV IVS:        1.90 cm  LV e' medial:    6.42 cm/s LVOT diam:     2.10 cm  LV E/e' medial:  11.1 LV SV:         82 LV SV Index:   34 LVOT Area:     3.46 cm  RIGHT VENTRICLE RV Basal diam:  1.90 cm RV S  prime:     11.10 cm/s TAPSE (M-mode): 2.2 cm LEFT ATRIUM             Index       RIGHT ATRIUM           Index LA diam:        2.80 cm 1.14 cm/m  RA Area:     10.10 cm LA Vol (A2C):   57.8 ml 23.63 ml/m RA Volume:   19.40 ml  7.93 ml/m LA Vol (A4C):   59.4 ml 24.28 ml/m LA Biplane Vol: 60.8 ml 24.85 ml/m  AORTIC VALVE AV Area (Vmax):    2.18 cm AV Area (Vmean):   2.14 cm AV Area (VTI):     2.58 cm AV Vmax:           184.00 cm/s AV Vmean:          122.000 cm/s AV VTI:            0.320 m AV Peak Grad:      13.5 mmHg AV Mean Grad:      7.0 mmHg LVOT Vmax:         116.00 cm/s LVOT Vmean:        75.400 cm/s LVOT VTI:          0.238 m LVOT/AV VTI ratio: 0.74  AORTA Ao Root diam: 3.00 cm Ao Asc diam:  3.30 cm MITRAL VALVE MV Area (PHT): 2.99 cm    SHUNTS MV Peak grad:  7.6 mmHg    Systemic VTI:  0.24 m MV Mean grad:  2.0 mmHg    Systemic Diam: 2.10 cm MV Vmax:       1.38 m/s MV Vmean:      70.9 cm/s MV Decel Time: 254 msec MV E velocity: 71.10 cm/s MV A velocity: 81.80 cm/s MV E/A ratio:  0.87 Jenkins Rouge MD Electronically signed by Jenkins Rouge MD Signature Date/Time: 11/14/2019/10:53:55 AM    Final     Microbiology: Recent Results (from the past 240 hour(s))  SARS Coronavirus 2 by RT PCR (hospital order, performed in [facility] hospital lab) Nasopharyngeal Nasopharyngeal Swab     Status: None   Collection Time: 11/13/19  6:12 PM   Specimen: Nasopharyngeal Swab  Result Value Ref Range Status   SARS Coronavirus 2 NEGATIVE NEGATIVE Final    Comment: (NOTE) SARS-CoV-2 target nucleic acids are NOT DETECTED.  The SARS-CoV-2 RNA is generally detectable in upper and lower respiratory specimens during the acute phase of infection. The lowest concentration of SARS-CoV-2 viral copies this assay can detect is 250 copies / mL. A negative result does not preclude SARS-CoV-2 infection and should not be used as the sole basis for treatment or other patient management decisions.  A negative result may occur  with improper specimen collection / handling, submission of specimen other than nasopharyngeal swab, presence of viral mutation(s) within the areas targeted by this assay, and inadequate number of viral copies (<250 copies / mL). A negative result must be combined with clinical observations, patient history, and epidemiological information.  Fact Sheet for Patients:   StrictlyIdeas.no  Fact Sheet for Healthcare Providers: BankingDealers.co.za  This test is not yet approved or  cleared by the [place] FDA and has been authorized for detection and/or diagnosis of SARS-CoV-2 by FDA under an Emergency Use Authorization (EUA).  This EUA will remain in effect (meaning this test can be used) for the duration of the COVID-19 declaration under Section 564(b)(1) of the Act, 21 U.S.C. section 360bbb-3(b)(1), unless the authorization is terminated or revoked sooner.  Performed at [facility], [place] [address] [place], [place] [address]      Labs: Basic Metabolic Panel: Recent Labs  Lab 11/13/19 1413 11/14/19 0040  NA 138 137  K 4.4 3.5  CL 99 100  CO2 29 25  GLUCOSE 369* 244*  BUN 9 9  CREATININE 0.83 0.79  CALCIUM 9.9 9.5  MG  --  2.0   Liver Function Tests: Recent Labs  Lab 11/14/19 0040  AST 17  ALT 16  ALKPHOS 103  BILITOT 0.4  PROT 7.8  ALBUMIN 3.8   No results for input(s): LIPASE, AMYLASE in the last 168 hours. No results for input(s): AMMONIA in the last 168 hours. CBC: Recent Labs  Lab 11/13/19 1413 11/14/19 0500  WBC 9.6 10.4  NEUTROABS  --  6.3  HGB 14.3 13.8  HCT 45.9 43.8  MCV 86.0 85.0  PLT 362 368   Cardiac Enzymes: No results for input(s): CKTOTAL, CKMB, CKMBINDEX, TROPONINI in the last 168 hours. BNP: BNP (last 3 results) Recent Labs    11/14/19 0102  BNP 29.5    ProBNP (last 3 results) No results for input(s): PROBNP in the last 8760 hours.  CBG: Recent Labs  Lab  11/14/19 0734 11/14/19 1208 11/14/19 1559 11/14/19 2120 11/15/19 0752  GLUCAP 254* 238* 175* 257* 220*       Signed:  Alma Friendly, MD [facility] 11/15/2019, 12:57 PM

## 2019-11-17 ENCOUNTER — Ambulatory Visit: Payer: BC Managed Care – PPO | Admitting: Family Medicine

## 2019-11-17 ENCOUNTER — Encounter: Payer: Self-pay | Admitting: Family Medicine

## 2019-11-17 ENCOUNTER — Other Ambulatory Visit: Payer: Self-pay

## 2019-11-17 VITALS — BP 154/100 | HR 102 | Temp 96.8°F | Wt 308.6 lb

## 2019-11-17 DIAGNOSIS — Z09 Encounter for follow-up examination after completed treatment for conditions other than malignant neoplasm: Secondary | ICD-10-CM

## 2019-11-17 DIAGNOSIS — E1165 Type 2 diabetes mellitus with hyperglycemia: Secondary | ICD-10-CM | POA: Diagnosis not present

## 2019-11-17 DIAGNOSIS — I119 Hypertensive heart disease without heart failure: Secondary | ICD-10-CM | POA: Diagnosis not present

## 2019-11-17 DIAGNOSIS — I10 Essential (primary) hypertension: Secondary | ICD-10-CM | POA: Diagnosis not present

## 2019-11-17 MED ORDER — METFORMIN HCL ER 750 MG PO TB24: 750.0000 mg | ORAL_TABLET | Freq: Every day | ORAL | 1 refills | Status: DC

## 2019-11-17 NOTE — Patient Instructions (Signed)
Stop the regular Metformin and start on extended release Metformin once daily until you see the endocrinologist.   Continue on all of your other medications.   Cut back on sugar and carbohydrates (potatoes, bread, rice, pasta). Do not drink any beverages with sugar in them.   Keep a close eye on your blood sugar and blood pressure at home. Keep a log of them.   Take all of your readings to your visits with your specialists.    DASH Eating Plan DASH stands for "Dietary Approaches to Stop Hypertension." The DASH eating plan is a healthy eating plan that has been shown to reduce high blood pressure (hypertension). It may also reduce your risk for type 2 diabetes, heart disease, and stroke. The DASH eating plan may also help with weight loss. What are tips for following this plan?  General guidelines  Avoid eating more than 2,300 mg (milligrams) of salt (sodium) a day. If you have hypertension, you may need to reduce your sodium intake to 1,500 mg a day.  Limit alcohol intake to no more than 1 drink a day for nonpregnant women and 2 drinks a day for men. One drink equals 12 oz of beer, 5 oz of wine, or 1 oz of hard liquor.  Work with your health care provider to maintain a healthy body weight or to lose weight. Ask what an ideal weight is for you.  Get at least 30 minutes of exercise that causes your heart to beat faster (aerobic exercise) most days of the week. Activities may include walking, swimming, or biking.  Work with your health care provider or diet and nutrition specialist (dietitian) to adjust your eating plan to your individual calorie needs. Reading food labels   Check food labels for the amount of sodium per serving. Choose foods with less than 5 percent of the Daily Value of sodium. Generally, foods with less than 300 mg of sodium per serving fit into this eating plan.  To find whole grains, look for the word "whole" as the first word in the ingredient  list. Shopping  Buy products labeled as "low-sodium" or "no salt added."  Buy fresh foods. Avoid canned foods and premade or frozen meals. Cooking  Avoid adding salt when cooking. Use salt-free seasonings or herbs instead of table salt or sea salt. Check with your health care provider or pharmacist before using salt substitutes.  Do not fry foods. Cook foods using healthy methods such as baking, boiling, grilling, and broiling instead.  Cook with heart-healthy oils, such as olive, canola, soybean, or sunflower oil. Meal planning  Eat a balanced diet that includes: ? 5 or more servings of fruits and vegetables each day. At each meal, try to fill half of your plate with fruits and vegetables. ? Up to 6-8 servings of whole grains each day. ? Less than 6 oz of lean meat, poultry, or fish each day. A 3-oz serving of meat is about the same size as a deck of cards. One egg equals 1 oz. ? 2 servings of low-fat dairy each day. ? A serving of nuts, seeds, or beans 5 times each week. ? Heart-healthy fats. Healthy fats called Omega-3 fatty acids are found in foods such as flaxseeds and coldwater fish, like sardines, salmon, and mackerel.  Limit how much you eat of the following: ? Canned or prepackaged foods. ? Food that is high in trans fat, such as fried foods. ? Food that is high in saturated fat, such as fatty meat. ?  Sweets, desserts, sugary drinks, and other foods with added sugar. ? Full-fat dairy products.  Do not salt foods before eating.  Try to eat at least 2 vegetarian meals each week.  Eat more home-cooked food and less restaurant, buffet, and fast food.  When eating at a restaurant, ask that your food be prepared with less salt or no salt, if possible. What foods are recommended? The items listed may not be a complete list. Talk with your dietitian about what dietary choices are best for you. Grains Whole-grain or whole-wheat bread. Whole-grain or whole-wheat pasta. Brown  rice. [place]. Bulgur. Whole-grain and low-sodium cereals. Pita bread. Low-fat, low-sodium crackers. Whole-wheat flour tortillas. Vegetables Fresh or frozen vegetables (raw, steamed, roasted, or grilled). Low-sodium or reduced-sodium tomato and vegetable juice. Low-sodium or reduced-sodium tomato sauce and tomato paste. Low-sodium or reduced-sodium canned vegetables. Fruits All fresh, dried, or frozen fruit. Canned fruit in natural juice (without added sugar). Meat and other protein foods Skinless chicken or [place]. Ground chicken or [place]. Pork with fat trimmed off. Fish and seafood. Egg whites. Dried beans, peas, or lentils. Unsalted nuts, nut butters, and seeds. Unsalted canned beans. Lean cuts of beef with fat trimmed off. Low-sodium, lean deli meat. Dairy Low-fat (1%) or fat-free (skim) milk. Fat-free, low-fat, or reduced-fat cheeses. Nonfat, low-sodium ricotta or cottage cheese. Low-fat or nonfat yogurt. Low-fat, low-sodium cheese. Fats and oils Soft margarine without trans fats. Vegetable oil. Low-fat, reduced-fat, or light mayonnaise and salad dressings (reduced-sodium). Canola, safflower, olive, soybean, and sunflower oils. Avocado. Seasoning and other foods Herbs. Spices. Seasoning mixes without salt. Unsalted popcorn and pretzels. Fat-free sweets. What foods are not recommended? The items listed may not be a complete list. Talk with your dietitian about what dietary choices are best for you. Grains Baked goods made with fat, such as croissants, muffins, or some breads. Dry pasta or rice meal packs. Vegetables Creamed or fried vegetables. Vegetables in a cheese sauce. Regular canned vegetables (not low-sodium or reduced-sodium). Regular canned tomato sauce and paste (not low-sodium or reduced-sodium). Regular tomato and vegetable juice (not low-sodium or reduced-sodium). Angie Fava. Olives. Fruits Canned fruit in a light or heavy syrup. Fried fruit. Fruit in cream or butter  sauce. Meat and other protein foods Fatty cuts of meat. Ribs. Fried meat. Berniece Salines. Sausage. Bologna and other processed lunch meats. Salami. Fatback. Hotdogs. Bratwurst. Salted nuts and seeds. Canned beans with added salt. Canned or smoked fish. Whole eggs or egg yolks. Chicken or [place] with skin. Dairy Whole or 2% milk, cream, and half-and-half. Whole or full-fat cream cheese. Whole-fat or sweetened yogurt. Full-fat cheese. Nondairy creamers. Whipped toppings. Processed cheese and cheese spreads. Fats and oils Butter. Stick margarine. Lard. Shortening. Ghee. Bacon fat. Tropical oils, such as coconut, palm kernel, or palm oil. Seasoning and other foods Salted popcorn and pretzels. Onion salt, garlic salt, seasoned salt, table salt, and sea salt. Worcestershire sauce. Tartar sauce. Barbecue sauce. Teriyaki sauce. Soy sauce, including reduced-sodium. Steak sauce. Canned and packaged gravies. Fish sauce. Oyster sauce. Cocktail sauce. Horseradish that you find on the shelf. Ketchup. Mustard. Meat flavorings and tenderizers. Bouillon cubes. Hot sauce and Tabasco sauce. Premade or packaged marinades. Premade or packaged taco seasonings. Relishes. Regular salad dressings. Where to find more information:  National Heart, Lung, and [place]: https://wilson-eaton.com/  American Heart Association: www.heart.org Summary  The DASH eating plan is a healthy eating plan that has been shown to reduce high blood pressure (hypertension). It may also reduce your risk for type 2 diabetes,  heart disease, and stroke.  With the DASH eating plan, you should limit salt (sodium) intake to 2,300 mg a day. If you have hypertension, you may need to reduce your sodium intake to 1,500 mg a day.  When on the DASH eating plan, aim to eat more fresh fruits and vegetables, whole grains, lean proteins, low-fat dairy, and heart-healthy fats.  Work with your health care provider or diet and nutrition specialist (dietitian) to adjust  your eating plan to your individual calorie needs. This information is not intended to replace advice given to you by your health care provider. Make sure you discuss any questions you have with your health care provider. Document Revised: 05/02/2017 Document Reviewed: 05/13/2016 Elsevier Patient Education  2020 [employer].

## 2019-11-17 NOTE — Progress Notes (Signed)
Subjective:    Patient ID: Amy Banks, female    DOB: 01-30-1976, 44 y.o.   MRN: [id number]  HPI Chief Complaint  Patient presents with  . other    hospital follow up on b/p and diabetes   Here for a hospital discharge follow up.  She was hospitalized with hypertensive urgency and uncontrolled diabetes from 11/13/2019-11/15/2019.  She presented to the ED with headache, chest pain and shortness of breath.  States she was off of her medications for months.  She was discharged from the hospital on metformin bid and Lantus. For her diabetes and amlodipine 10 mg and Hyzaar 100-12.5 mg for HTN.  Her Hgb A1c was 11.7%  States her FBS today was 186 and yesterday it was >200.  States she is no longer having chest pain or shortness of breath. Continues having headache.  States metformin is causing her to have GI upset and would like to switch to XR as she has taken in the past.  Negative head CT on 11/13/2019.   States her BP yesterday was 144/95   States she saw Dr. Benjie Karvonen, her OB/GYN in January 2021 and was told her BP was good. She was not taking her BP medication at her visit so she did not realize she needed to continue taking it since her BP was reportedly fine at her OB/GYN office.  She is still wanting to have a baby and is not using protection.   Echocardiogram showed severe LVH.   No other concerns today.   Denies fever, chills, dizziness, chest pain, palpitations, shortness of breath, abdominal pain, N/V/D, urinary symptoms, LE edema.    Reviewed allergies, medications, past medical, surgical, family, and social history.     Review of Systems Pertinent positives and negatives in the history of present illness.     Objective:   Physical Exam BP (!) 154/100   Pulse (!) 102   Temp (!) 96.8 F (36 C)   Wt (!) 308 lb 9.6 oz (140 kg)   SpO2 98%   BMI 49.81 kg/m   Alert and in no distress.Neck is supple without adenopathy or thyromegaly. Cardiac exam shows a regular  rhythm without murmurs or gallops. Lungs are clear to auscultation. Extremities without edema. Skin is warm and dry.        Assessment & Plan:  Hospital discharge follow-up -reviewed hospital discharge summary, labs, echocardiogram and CT head. Reconciled medications. Referrals made to endocrinology, cardiology and MNT to help with uncontrolled chronic health conditions.   Uncontrolled type 2 diabetes mellitus with hyperglycemia, without long-term current use of insulin ([place]) - Plan: Amb ref to Medical Nutrition Therapy-MNT, Ambulatory referral to Endocrinology, metFORMIN (GLUCOPHAGE-XR) 750 MG 24 hr tablet -I will switch her to metformin XR since she is having severe GI upset. She will continue on Lantus 30 units. Referrals made to MNT and endocrinology. Recommend she keep a close eye on her BS and keep a log to take to her appointment. She would like to get her diabetes under control as soon as possible so that she can try to have a baby. Recommend for now she used contraception.   Uncontrolled hypertension - Plan: Amb ref to Medical Nutrition Therapy-MNT, Ambulatory referral to Cardiology -HTN is still uncontrolled but she has not been back on medication very long. Referral to nutritionist. Recommend low sodium diet. Referral to cardiology for help with HTN and possible referral to HTN clinic for close monitoring.   Morbid obesity ([place]) -counseling on health diet  and referral to nutritionist.   Hypertensive left ventricular hypertrophy, without heart failure - Plan: Ambulatory referral to Cardiology

## 2019-11-24 ENCOUNTER — Encounter: Payer: Self-pay | Admitting: Internal Medicine

## 2019-11-24 ENCOUNTER — Ambulatory Visit: Payer: BC Managed Care – PPO | Admitting: Internal Medicine

## 2019-11-24 ENCOUNTER — Other Ambulatory Visit: Payer: Self-pay

## 2019-11-24 VITALS — BP 162/102 | HR 72 | Ht 66.0 in | Wt 313.2 lb

## 2019-11-24 DIAGNOSIS — E1165 Type 2 diabetes mellitus with hyperglycemia: Secondary | ICD-10-CM | POA: Diagnosis not present

## 2019-11-24 DIAGNOSIS — E785 Hyperlipidemia, unspecified: Secondary | ICD-10-CM | POA: Diagnosis not present

## 2019-11-24 DIAGNOSIS — I10 Essential (primary) hypertension: Secondary | ICD-10-CM

## 2019-11-24 LAB — GLUCOSE, POCT (MANUAL RESULT ENTRY): POC Glucose: 208 mg/dl — AB (ref 70–99)

## 2019-11-24 MED ORDER — LANTUS SOLOSTAR 100 UNIT/ML ~~LOC~~ SOPN: 34.0000 [IU] | PEN_INJECTOR | Freq: Every day | SUBCUTANEOUS | 11 refills | Status: DC

## 2019-11-24 MED ORDER — ATORVASTATIN CALCIUM 10 MG PO TABS
10.0000 mg | ORAL_TABLET | Freq: Every day | ORAL | 1 refills | Status: DC
Start: 2019-11-24 — End: 2020-03-08

## 2019-11-24 MED ORDER — INSULIN PEN NEEDLE 32G X 4 MM MISC: 1.0000 | Freq: Every day | 11 refills | Status: AC

## 2019-11-24 MED ORDER — TRULICITY 0.75 MG/0.5ML ~~LOC~~ SOAJ
0.7500 mg | SUBCUTANEOUS | 4 refills | Status: DC
Start: 2019-11-24 — End: 2020-01-19

## 2019-11-24 NOTE — Progress Notes (Signed)
Name: Amy Banks  MRN/ DOB: [id number], [birth year]   Age/ Sex: 44 y.o., female    PCP: [name], NP-C   Reason for Endocrinology Evaluation: Type 2 Diabetes Mellitus     Date of Initial Endocrinology Visit: 11/24/2019     PATIENT IDENTIFIER: Amy Banks is a 44 y.o. female with a past medical history of HTN, T2DM and Dyslipidemia. The patient presented for initial endocrinology clinic visit on 11/24/2019 for consultative assistance with her diabetes management.    HPI: Amy Banks was    Diagnosed with DM years  Prior Medications tried/Intolerance: Trulicity - slight headaches. Higher doses of metformin cause vomiting.  Currently checking blood sugars 1 x / day Hypoglycemia episodes : no              Hemoglobin A1c has ranged from 9.0% in 2018, peaking at 11.7%  in 2021 Patient required assistance for hypoglycemia:  Patient has required hospitalization within the last 1 year from hyper or hypoglycemia: no   In terms of diet, the patient  Eats 3 meals a day, snacks 2-3 . Stopped sugar- sweetened beverages    Recruits for [place]: Lantus 30 units daily  Metformin 750 mg daily     Statin: no ACE-I/ARB: yes Prior Diabetic Education: no   METER DOWNLOAD SUMMARY: Did not bring    DIABETIC COMPLICATIONS: Microvascular complications:    Denies: CKD, retinopathy , neuropathy   Last eye exam: Completed 08/2019  Macrovascular complications:    Denies: CAD, PVD, CVA   PAST HISTORY: Past Medical History:  Past Medical History:  Diagnosis Date   Anemia    Anxiety    Depression    Diabetes mellitus without complication ([place])    Diverticulitis 08/2013   Fibroids    tx w/ hormone meds   History of blood transfusion 03/2016   at [facility]   Hypertension    Infertility, female    on hormone meds   Morbid obesity [facility])    Past Surgical History:  Past Surgical History:  Procedure Laterality Date    BREAST SURGERY  2001   reduction   LAPAROTOMY Right 01/02/2015   Procedure: EXPLORATORY LAPAROTOMY WITH RIGHT OVARIAN CYSTECTOMY;  Surgeon: Marylynn Pearson, MD;  Location: [facility];  Service: Gynecology;  Laterality: Right;   LAPAROTOMY N/A 06/13/2017   Procedure: MINI LAPAROTOMY;  Surgeon: Governor Specking, MD;  Location: [facility];  Service: Gynecology;  Laterality: N/A;   LYSIS OF ADHESION N/A 01/02/2015   Procedure: LYSIS OF ADHESION;  Surgeon: Marylynn Pearson, MD;  Location: [facility];  Service: Gynecology;  Laterality: N/A;   LYSIS OF ADHESION N/A 06/13/2017   Procedure: LYSIS OF ADHESION;  Surgeon: Governor Specking, MD;  Location: [facility];  Service: Gynecology;  Laterality: N/A;   MYOMECTOMY  2009   ROBOT ASSISTED MYOMECTOMY N/A 06/13/2017   Procedure: ATTEMPTED ROBOTIC ASSISTED MYOMECTOMY;  Surgeon: Governor Specking, MD;  Location: [facility];  Service: Gynecology;  Laterality: N/A;      Social History:  reports that she has never smoked. She has never used smokeless tobacco. She reports that she does not drink alcohol and does not use drugs. Family History:  Family History  Problem Relation Age of Onset   Diabetes Mother    Hypertension Mother    Diabetes Sister    Diabetes Maternal Grandmother    Hypertension Maternal Grandmother    Hypertension Other    Diabetes Other    Breast cancer Other  Ovarian cancer Other    Cancer Maternal Aunt      HOME MEDICATIONS: Allergies as of 11/24/2019      Reactions   Codeine Hives, Itching      Medication List       Accurate as of November 24, 2019  1:13 PM. If you have any questions, ask your nurse or doctor.        Accu-Chek Guide test strip Generic drug: glucose blood USE UP TO FOUR TIMES DAILY AS DIRECTED.   Accu-Chek Softclix Lancets lancets USE UP TO FOUR TIMES DAILY AS DIRECTED.   amLODipine 10 MG tablet Commonly known as: NORVASC Take 1 tablet (10 mg total) by mouth daily.   blood glucose meter kit and  supplies Dispense based on patient and insurance preference. Use up to four times daily as directed. (FOR ICD-10 E10.9, E11.9).   Insulin Pen Needle 32G X 4 MM Misc 1 each by Does not apply route daily.   Lantus SoloStar 100 UNIT/ML Solostar Pen Generic drug: insulin glargine Inject 30 Units into the skin daily.   losartan-hydrochlorothiazide 100-12.5 MG tablet Commonly known as: HYZAAR Take 1 tablet by mouth daily.   metFORMIN 750 MG 24 hr tablet Commonly known as: GLUCOPHAGE-XR Take 1 tablet (750 mg total) by mouth daily with breakfast.   miconazole nitrate Powd Commonly known as: MICATIN Apply 1 application topically 2 (two) times daily as needed.   VITAMIN D PO Take 1 tablet by mouth daily. gummies        ALLERGIES: Allergies  Allergen Reactions   Codeine Hives and Itching     REVIEW OF SYSTEMS: A comprehensive ROS was conducted with the patient and is negative except as per HPI and below:  Review of Systems  Gastrointestinal: Negative for diarrhea and nausea.  Genitourinary: Negative for frequency.  Endo/Heme/Allergies: Negative for polydipsia.      OBJECTIVE:   VITAL SIGNS: BP (!) 162/102 (BP Location: Left Arm, Patient Position: Sitting, Cuff Size: Large)    Pulse 72    Ht '5\' 6"'  (1.676 m)    Wt (!) 313 lb 3.2 oz (142.1 kg)    SpO2 98%    BMI 50.55 kg/m    PHYSICAL EXAM:  General: Pt appears well and is in NAD  HEENT:  Eyes: External eye exam normal without stare, lid lag or exophthalmos.  EOM intact.    Neck: General: Supple without adenopathy or carotid bruits. Thyroid: Thyroid size normal.  No goiter or nodules appreciated. No thyroid bruit.  Lungs: Clear with good BS bilat with no rales, rhonchi, or wheezes  Heart: RRR with normal S1 and S2 and no gallops; no murmurs; no rub  Abdomen: Normoactive bowel sounds, soft, nontender, without masses or organomegaly palpable  Extremities:  Lower extremities - No pretibial edema. No lesions.  Skin: Normal  texture and temperature to palpation. No rash noted. No Acanthosis nigricans/skin tags. No lipohypertrophy.  Neuro: MS is good with appropriate affect, pt is alert and Ox3    DM foot exam: 11/24/2019  The skin of the feet is intact without sores or ulcerations. The pedal pulses are 2+ on right and 2+ on left. The sensation is intact to a screening 5.07, 10 gram monofilament bilaterally      DATA REVIEWED:  Lab Results  Component Value Date   HGBA1C 11.7 (H) 11/14/2019   HGBA1C 11.0 (H) 10/09/2018   HGBA1C 9.4 06/04/2017   Lab Results  Component Value Date   MICROALBUR 1.7 02/13/2017  LDLCALC 142 (H) 10/09/2018   CREATININE 0.79 11/14/2019   Lab Results  Component Value Date   MICRALBCREAT 8 02/13/2017    Lab Results  Component Value Date   CHOL 203 (H) 10/09/2018   HDL 38 (L) 10/09/2018   LDLCALC 142 (H) 10/09/2018   TRIG 114 10/09/2018   CHOLHDL 5.3 (H) 10/09/2018       In-office BG 208 mg/dL  ASSESSMENT / PLAN / RECOMMENDATIONS:   1) Type 2 Diabetes Mellitus, Poorly controlled, Without complications - Most recent A1c of 11.7 %. Goal A1c < 7.0 %.    Plan: GENERAL: I have discussed with the patient the pathophysiology of diabetes. We went over the natural progression of the disease. We talked about both insulin resistance and insulin deficiency. We stressed the importance of lifestyle changes including diet and exercise. I explained the complications associated with diabetes including retinopathy, nephropathy, neuropathy as well as increased risk of cardiovascular disease. We went over the benefit seen with glycemic control.    I explained to the patient that diabetic patients are at higher than normal risk for amputations.   I have advised the pt to avoid sugar-sweetened beverages and snacks, we also discussed low carb snacks if needed  She has been on Trulicity before without GI symptoms , she self stopped the medication, will restart   Intolerant to higher  doses of metformin   Will check BMP    MEDICATIONS: - Increase Lantus to 34 units daily  - Continue Metformin 750 mg daily  - Restart Trulicity 0.93 mg weekly    EDUCATION / INSTRUCTIONS:  BG monitoring instructions: Patient is instructed to check her blood sugars 1 times a day, fasting  Call [facility] if: BG persistently < 70   I reviewed the Rule of 15 for the treatment of hypoglycemia in detail with the patient. Literature supplied.   2) Diabetic complications:   Eye: Does not have known diabetic retinopathy.   Neuro/ Feet: Does not have known diabetic peripheral neuropathy.  Renal: Patient does not have known baseline CKD. She is on an ACEI/ARB at present.Will check urine for MA/Cr ratio.   3) Dyslipidemia: Patient is not on a statin. LDL elevated at 142 mg/dL . Discussed cardiovascular benefits of statins and pt in agreement to start this . Will check lipids today    Medication  Atorvastatin 10 mg daily    4) Hypertension: Above goal of < 140/90 mmHg. Will check for hyperaldosteronism and excess cortisol    F/U in 8 weeks    Signed electronically by: Mack Guise, MD  [facility] End[facility]in[facility]  [facility] [address] [place], [place] [address] Phone: [phone] FAX: [phone]   CC: [name], NP-C [address] [place] [address] Phone: [phone]  Fax: [phone]    Return to Endocrinology clinic as below: Future Appointments  Date Time Provider [place]  12/07/2019  8:20 AM O'Neal, Cassie Freer, MD [facility]  02/17/2020 10:00 AM [name], NP-C [facility]

## 2019-11-24 NOTE — Patient Instructions (Addendum)
-   Increase Lantus to 34 units daily  - Continue Metformin 750 mg daily  - Restart Trulicity 7.00 mg weekly      Choose healthy, lower carb lower calorie snacks: toss salad, vegetables, cottage cheese, peanut butter, low fat cheese / string cheese, lower sodium deli meat, tuna salad or chicken salad     HOW TO TREAT LOW BLOOD SUGARS (Blood sugar LESS THAN 70 MG/DL)  Please follow the RULE OF 15 for the treatment of hypoglycemia treatment (when your (blood sugars are less than 70 mg/dL)    STEP 1: Take 15 grams of carbohydrates when your blood sugar is low, which includes:   3-4 GLUCOSE TABS  OR  3-4 OZ OF JUICE OR REGULAR SODA OR  ONE TUBE OF GLUCOSE GEL     STEP 2: RECHECK blood sugar in 15 MINUTES STEP 3: If your blood sugar is still low at the 15 minute recheck --> then, go back to STEP 1 and treat AGAIN with another 15 grams of carbohydrates.    24-Hour Urine Collection   You will be collecting your urine for a 24-hour period of time.  Your timer starts with your first urine of the morning (For example - If you first pee at [place], your timer will start at [place])  [place] away your first urine of the morning  Collect your urine every time you pee for the next 24 hours STOP your urine collection 24 hours after you started the collection (For example - You would stop at 9AM the day after you started)

## 2019-11-25 LAB — COMPREHENSIVE METABOLIC PANEL
ALT: 16 U/L (ref 0–35)
AST: 15 U/L (ref 0–37)
Albumin: 4.3 g/dL (ref 3.5–5.2)
Alkaline Phosphatase: 94 U/L (ref 39–117)
BUN: 11 mg/dL (ref 6–23)
CO2: 29 mEq/L (ref 19–32)
Calcium: 10 mg/dL (ref 8.4–10.5)
Chloride: 97 mEq/L (ref 96–112)
Creatinine, Ser: 0.69 mg/dL (ref 0.40–1.20)
GFR: 112.72 mL/min (ref >60.00)
Glucose, Bld: 177 mg/dL — ABNORMAL HIGH (ref 70–99)
Potassium: 3.9 mEq/L (ref 3.5–5.1)
Sodium: 134 mEq/L — ABNORMAL LOW (ref 135–145)
Total Bilirubin: 0.2 mg/dL (ref 0.2–1.2)
Total Protein: 7.4 g/dL (ref 6.0–8.3)

## 2019-11-25 LAB — LIPID PANEL
Cholesterol: 161 mg/dL (ref 0–200)
HDL: 35 mg/dL — ABNORMAL LOW (ref >39.00)
LDL Cholesterol: 104 mg/dL — ABNORMAL HIGH (ref 0–99)
NonHDL: 125.82
Total CHOL/HDL Ratio: 5
Triglycerides: 111 mg/dL (ref 0.0–149.0)
VLDL: 22.2 mg/dL (ref 0.0–40.0)

## 2019-11-29 LAB — ALDOSTERONE + RENIN ACTIVITY W/ RATIO
ALDO / PRA Ratio: 0.4 Ratio — ABNORMAL LOW (ref 0.9–28.9)
Aldosterone: 2 ng/dL
Renin Activity: 4.66 ng/mL/h (ref 0.25–5.82)

## 2019-12-06 NOTE — Progress Notes (Signed)
Cardiology Office Note:   Date:  12/07/2019  NAME:  Amy Banks    MRN: [id number] DOB:  01/19/1976   PCP:  [name], NP-C  Cardiologist:  No primary care provider on file.   Referring MD: [name], NP-C   Chief Complaint  Patient presents with  . Abnormal ECG   History of Present Illness:   Amy Banks is a 44 y.o. female with a hx of HTN, obesity, HLD, DM who is being seen today for the evaluation of abnormal EKG at the request of Henson, Vickie L, NP-C. Recent admission for HTN urgency. Evaluated by endocrinology and negative screen for hyperaldosteronism.   She reports has had high blood pressure since her mid 58s.  Her main risk factors include morbid obesity as well as likely untreated sleep apnea.  She does report that she snores frequently and is fatigued during the day.  She is never been tested for sleep apnea.  She was evaluated by endocrinology and ruled out for hyperaldosteronism.  Apparently she was admitted to the hospital for hypertensive urgency.  She had quit taking her medications for about 6 months.  This included diabetes as well as blood pressure medications.  She is been restarted on amlodipine 10 mg daily, losartan 100 mg daily, HCTZ 12.5 mg daily.  Her blood pressure is 142/92 today.  She did take her medications.  She does not exercise routinely but denies any chest pain or shortness of breath.  Apparently when she was admitted in the hospital with hypertensive urgency she did have some chest pain or shortness of breath.  She reports that she has started to meet with a nutritionist and is working on her diet.  Her diabetes is uncontrolled with an A1c 11.7.  She is working on this.  She is on a statin but her LDL remains 104.  Blood pressure is not that far off from goal.  She is only on 3 medications.  I think she will get there with continue diet and exercise.  Problem List 1. HTN 2. DM2 -A1c 11.7 -T chol 161, HDL 35, LDL 104, TG 111 3.  Obesity (BMI 49)  Past Medical History: Past Medical History:  Diagnosis Date  . Anemia   . Anxiety   . Depression   . Diabetes mellitus without complication ([place])   . Diverticulitis 08/2013  . Fibroids    tx w/ hormone meds  . History of blood transfusion 03/2016   at [facility]  . Hypertension   . Infertility, female    on hormone meds  . Morbid obesity ([place])     Past Surgical History: Past Surgical History:  Procedure Laterality Date  . BREAST SURGERY  2001   reduction  . LAPAROTOMY Right 01/02/2015   Procedure: EXPLORATORY LAPAROTOMY WITH RIGHT OVARIAN CYSTECTOMY;  Surgeon: Marylynn Pearson, MD;  Location: [facility];  Service: Gynecology;  Laterality: Right;  . LAPAROTOMY N/A 06/13/2017   Procedure: MINI LAPAROTOMY;  Surgeon: Governor Specking, MD;  Location: [facility];  Service: Gynecology;  Laterality: N/A;  . LYSIS OF ADHESION N/A 01/02/2015   Procedure: LYSIS OF ADHESION;  Surgeon: Marylynn Pearson, MD;  Location: [facility];  Service: Gynecology;  Laterality: N/A;  . LYSIS OF ADHESION N/A 06/13/2017   Procedure: LYSIS OF ADHESION;  Surgeon: Governor Specking, MD;  Location: [facility];  Service: Gynecology;  Laterality: N/A;  . MYOMECTOMY  2009  . ROBOT ASSISTED MYOMECTOMY N/A 06/13/2017   Procedure: ATTEMPTED ROBOTIC ASSISTED MYOMECTOMY;  Surgeon: Governor Specking, MD;  Location: [facility];  Service: Gynecology;  Laterality: N/A;    Current Medications: Current Meds  Medication Sig  . ACCU-CHEK GUIDE test strip USE UP TO FOUR TIMES DAILY AS DIRECTED.  . Accu-Chek Softclix Lancets lancets USE UP TO FOUR TIMES DAILY AS DIRECTED.  Marland Kitchen amLODipine (NORVASC) 10 MG tablet Take 1 tablet (10 mg total) by mouth daily.  Marland Kitchen atorvastatin (LIPITOR) 10 MG tablet Take 1 tablet (10 mg total) by mouth daily.  . blood glucose meter kit and supplies Dispense based on patient and insurance preference. Use up to four times daily as directed. (FOR ICD-10 E10.9, E11.9).  . Dulaglutide (TRULICITY) 0.94  BS/9.6GE SOPN Inject 0.5 mLs (0.75 mg total) into the skin once a week.  . insulin glargine (LANTUS SOLOSTAR) 100 UNIT/ML Solostar Pen Inject 34 Units into the skin daily.  . Insulin Pen Needle 32G X 4 MM MISC 1 each by Does not apply route daily.  Marland Kitchen losartan-hydrochlorothiazide (HYZAAR) 100-12.5 MG tablet Take 1 tablet by mouth daily.  . metFORMIN (GLUCOPHAGE-XR) 750 MG 24 hr tablet Take 1 tablet (750 mg total) by mouth daily with breakfast.  . miconazole nitrate (MICATIN) POWD Apply 1 application topically 2 (two) times daily as needed.  Marland Kitchen VITAMIN D PO Take 1 tablet by mouth daily. gummies     Allergies:    Codeine   Social History: Social History   Socioeconomic History  . Marital status: Married    Spouse name: Not on file  . Number of children: Not on file  . Years of education: Not on file  . Highest education level: Not on file  Occupational History  . Not on file  Tobacco Use  . Smoking status: Never Smoker  . Smokeless tobacco: Never Used  Vaping Use  . Vaping Use: Never used  Substance and Sexual Activity  . Alcohol use: No  . Drug use: No  . Sexual activity: Yes    Birth control/protection: None  Other Topics Concern  . 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 Difficulty of Paying Living Expenses:   Food Insecurity:   . Worried About Charity fundraiser in the Last Year:   . Arboriculturist in the Last Year:   Transportation Needs:   . Film/video editor (Medical):   Marland Kitchen Lack of Transportation (Non-Medical):   Physical Activity:   . Days of Exercise per Week:   . Minutes of Exercise per Session:   Stress:   . Feeling of Stress :   Social Connections:   . Frequency of Communication with Friends and Family:   . Frequency of Social Gatherings with Friends  and Family:   . Attends Religious Services:   . Active Member of Clubs or Organizations:   . Attends Archivist Meetings:   Marland Kitchen Marital Status:      Family History: The patient's family history includes Breast cancer in an other family member; Cancer in her maternal aunt; Diabetes in her maternal grandmother, mother, sister, and another family member; Heart disease in her maternal grandfather and maternal grandmother; Hypertension in her maternal grandmother, mother, and another family member; Ovarian cancer in an other family member.  ROS:   All other ROS reviewed and negative. Pertinent positives  noted in the HPI.     EKGs/Labs/Other Studies Reviewed:   The following studies were personally reviewed by me today:  EKG:  EKG is ordered today.  The ekg ordered today demonstrates normal sinus rhythm, heart rate 75, no acute ST-T changes, no evidence for infarction, and was personally reviewed by me.   TTE 11/14/2019 1. Left ventricular ejection fraction, by estimation, is 55 to 60%. The  left ventricle has normal function. The left ventricle has no regional  wall motion abnormalities. There is severe left ventricular hypertrophy.  Left ventricular diastolic parameters  were normal.  2. Right ventricular systolic function is normal. The right ventricular  size is normal.  3. The mitral valve is normal in structure. Trivial mitral valve  regurgitation. No evidence of mitral stenosis.  4. The aortic valve is tricuspid. Aortic valve regurgitation is not  visualized. Mild aortic valve sclerosis is present, with no evidence of  aortic valve stenosis.  5. The inferior vena cava is normal in size with greater than 50%  respiratory variability, suggesting right atrial pressure of 3 mmHg.  Recent Labs: 11/14/2019: B Natriuretic Peptide 29.5; Hemoglobin 13.8; Magnesium 2.0; Platelets 368 11/24/2019: ALT 16; BUN 11; Creatinine, Ser 0.69; Potassium 3.9; Sodium 134   Recent Lipid  Panel    Component Value Date/Time   CHOL 161 11/24/2019 1354   CHOL 203 (H) 10/09/2018 1440   TRIG 111.0 11/24/2019 1354   HDL 35.00 (L) 11/24/2019 1354   HDL 38 (L) 10/09/2018 1440   CHOLHDL 5 11/24/2019 1354   VLDL 22.2 11/24/2019 1354   LDLCALC 104 (H) 11/24/2019 1354   LDLCALC 142 (H) 10/09/2018 1440    Physical Exam:   VS:  BP (!) 142/92   Pulse 75   Ht '5\' 6"'  (1.676 m)   Wt (!) 317 lb (143.8 kg)   SpO2 98%   BMI 51.17 kg/m    Wt Readings from Last 3 Encounters:  12/07/19 (!) 317 lb (143.8 kg)  11/24/19 (!) 313 lb 3.2 oz (142.1 kg)  11/17/19 (!) 308 lb 9.6 oz (140 kg)    General: Morbidly obese female, no acute distress Heart: Atraumatic, normal size  Eyes: PEERLA, EOMI  Neck: Supple, no JVD Endocrine: No thryomegaly Cardiac: Normal S1, S2; RRR; no murmurs, rubs, or gallops Lungs: Clear to auscultation bilaterally, no wheezing, rhonchi or rales  Abd: Soft, nontender, no hepatomegaly  Ext: No edema, pulses 2+ Musculoskeletal: No deformities, BUE and BLE strength normal and equal Skin: Warm and dry, no rashes   Neuro: Alert and oriented to person, place, time, and situation, CNII-XII grossly intact, no focal deficits  Psych: Normal mood and affect   ASSESSMENT:   Amy Banks is a 44 y.o. female who presents for the following: 1. Nonspecific abnormal electrocardiogram (ECG) (EKG)   2. Essential hypertension   3. Mixed hyperlipidemia   4. Obesity, morbid, BMI 40.0-49.9 ([place])   5. Chronic fatigue   6. Snoring     PLAN:   1. Nonspecific abnormal electrocardiogram (ECG) (EKG) 2. Essential hypertension -Blood pressure 142/92.  She screened negative for hyperaldosteronism.  I do have concerns for sleep apnea.  She does snore and is tired frequently.  We will proceed with a sleep study.  Her blood pressure is not that far from goal.  Goal is less than 130/80.  We discussed adding another medicine versus continue diet and exercise.  She has opted for conservative  approach with diet exercise and weight loss.  She also  will work on salt reductive strategies.  I will plan to see her back in about 3 months.  This will also give her time to complete her sleep study.  3. Mixed hyperlipidemia -Continue statin.  Most recent LDL 104.  We will plan to further titrate this at her next visit.  Goal LDL cholesterol is less than 70 given diabetes.  4. Obesity, morbid, BMI 40.0-49.9 ([place]) -Diet and exercise encouraged.  Weight loss also encouraged.  5. Chronic fatigue 6. Snoring -Home sleep study.   Disposition: Return in about 3 months (around 03/08/2020).  Medication Adjustments/Labs and Tests Ordered: Current medicines are reviewed at length with the patient today.  Concerns regarding medicines are outlined above.  Orders Placed This Encounter  Procedures  . EKG 12-Lead  . Home sleep test   No orders of the defined types were placed in this encounter.   Patient Instructions  Medication Instructions:  The current medical regimen is effective;  continue present plan and medications.  *If you need a refill on your cardiac medications before your next appointment, please call your pharmacy*   Testing/Procedures: Your physician has recommended that you have a sleep study. This test records several body functions during sleep, including: brain activity, eye movement, oxygen and carbon dioxide blood levels, heart rate and rhythm, breathing rate and rhythm, the flow of air through your mouth and nose, snoring, body muscle movements, and chest and belly movement.   Follow-Up: At [facility], you and your health needs are our priority.  As part of our continuing mission to provide you with exceptional heart care, we have created designated Provider Care Teams.  These Care Teams include your primary Cardiologist (physician) and Advanced Practice Providers (APPs -  Physician Assistants and Nurse Practitioners) who all work together to provide you with the care  you need, when you need it.  We recommend signing up for the patient portal called "MyChart".  Sign up information is provided on this After Visit Summary.  MyChart is used to connect with patients for Virtual Visits (Telemedicine).  Patients are able to view lab/test results, encounter notes, upcoming appointments, etc.  Non-urgent messages can be sent to your provider as well.   To learn more about what you can do with MyChart, go to [email or web address].    Your next appointment:   3 month(s)  The format for your next appointment:   In Person  Provider:   Eleonore Chiquito, MD   Other Instructions Keep BP log- bring with you to next appointment.  Follow salty six information sheet.     Signed, Addison Naegeli. Audie Box, [place]  [address], [place], [place] [address] [phone]  12/07/2019 10:37 AM

## 2019-12-07 ENCOUNTER — Ambulatory Visit: Payer: BC Managed Care – PPO | Admitting: Cardiovascular Disease

## 2019-12-07 ENCOUNTER — Other Ambulatory Visit: Payer: Self-pay

## 2019-12-07 ENCOUNTER — Encounter: Payer: Self-pay | Admitting: Cardiovascular Disease

## 2019-12-07 VITALS — BP 142/92 | HR 75 | Ht 66.0 in | Wt 317.0 lb

## 2019-12-07 DIAGNOSIS — I10 Essential (primary) hypertension: Secondary | ICD-10-CM

## 2019-12-07 DIAGNOSIS — R9431 Abnormal electrocardiogram [ECG] [EKG]: Secondary | ICD-10-CM | POA: Diagnosis not present

## 2019-12-07 DIAGNOSIS — R5382 Chronic fatigue, unspecified: Secondary | ICD-10-CM

## 2019-12-07 DIAGNOSIS — R0683 Snoring: Secondary | ICD-10-CM

## 2019-12-07 DIAGNOSIS — E782 Mixed hyperlipidemia: Secondary | ICD-10-CM | POA: Diagnosis not present

## 2019-12-07 NOTE — Patient Instructions (Signed)
Medication Instructions:  The current medical regimen is effective;  continue present plan and medications.  *If you need a refill on your cardiac medications before your next appointment, please call your pharmacy*   Testing/Procedures: Your physician has recommended that you have a sleep study. This test records several body functions during sleep, including: brain activity, eye movement, oxygen and carbon dioxide blood levels, heart rate and rhythm, breathing rate and rhythm, the flow of air through your mouth and nose, snoring, body muscle movements, and chest and belly movement.   Follow-Up: At [facility], you and your health needs are our priority.  As part of our continuing mission to provide you with exceptional heart care, we have created designated Provider Care Teams.  These Care Teams include your primary Cardiologist (physician) and Advanced Practice Providers (APPs -  Physician Assistants and Nurse Practitioners) who all work together to provide you with the care you need, when you need it.  We recommend signing up for the patient portal called "MyChart".  Sign up information is provided on this After Visit Summary.  MyChart is used to connect with patients for Virtual Visits (Telemedicine).  Patients are able to view lab/test results, encounter notes, upcoming appointments, etc.  Non-urgent messages can be sent to your provider as well.   To learn more about what you can do with MyChart, go to [email or web address].    Your next appointment:   3 month(s)  The format for your next appointment:   In Person  Provider:   Eleonore Chiquito, MD   Other Instructions Keep BP log- bring with you to next appointment.  Follow salty six information sheet.

## 2019-12-12 ENCOUNTER — Encounter: Payer: Self-pay | Admitting: Family Medicine

## 2019-12-13 DIAGNOSIS — I10 Essential (primary) hypertension: Secondary | ICD-10-CM

## 2019-12-13 MED ORDER — AMLODIPINE BESYLATE 10 MG PO TABS: 10.0000 mg | ORAL_TABLET | Freq: Every day | ORAL | 11 refills | Status: DC

## 2019-12-13 MED ORDER — LOSARTAN POTASSIUM-HCTZ 100-12.5 MG PO TABS: 1.0000 | ORAL_TABLET | Freq: Every day | ORAL | 11 refills | Status: DC

## 2019-12-15 ENCOUNTER — Ambulatory Visit: Payer: BC Managed Care – PPO | Admitting: Endocrinology

## 2020-01-06 ENCOUNTER — Encounter: Payer: Self-pay | Admitting: Internal Medicine

## 2020-01-07 ENCOUNTER — Other Ambulatory Visit: Payer: Self-pay

## 2020-01-07 MED ORDER — ONETOUCH ULTRA 2 W/DEVICE KIT: 1.0000 | PACK | Freq: Every day | 1 refills | Status: DC

## 2020-01-07 MED ORDER — ONETOUCH ULTRASOFT LANCETS MISC: 1.0000 | 1 refills | Status: DC | PRN

## 2020-01-07 MED ORDER — ONETOUCH ULTRA VI STRP: 1.0000 | ORAL_STRIP | 1 refills | Status: DC | PRN

## 2020-01-10 ENCOUNTER — Other Ambulatory Visit: Payer: Self-pay | Admitting: Family Medicine

## 2020-01-10 DIAGNOSIS — E1165 Type 2 diabetes mellitus with hyperglycemia: Secondary | ICD-10-CM

## 2020-01-12 ENCOUNTER — Encounter: Payer: Self-pay | Admitting: Internal Medicine

## 2020-01-12 DIAGNOSIS — E1165 Type 2 diabetes mellitus with hyperglycemia: Secondary | ICD-10-CM

## 2020-01-12 MED ORDER — METFORMIN HCL ER 750 MG PO TB24: 750.0000 mg | ORAL_TABLET | Freq: Every day | ORAL | 1 refills | Status: DC

## 2020-01-19 ENCOUNTER — Ambulatory Visit: Payer: BC Managed Care – PPO | Admitting: Internal Medicine

## 2020-01-19 ENCOUNTER — Other Ambulatory Visit: Payer: Self-pay

## 2020-01-19 ENCOUNTER — Encounter: Payer: Self-pay | Admitting: Internal Medicine

## 2020-01-19 VITALS — BP 124/72 | HR 89 | Ht 66.0 in | Wt 321.6 lb

## 2020-01-19 DIAGNOSIS — E1165 Type 2 diabetes mellitus with hyperglycemia: Secondary | ICD-10-CM | POA: Diagnosis not present

## 2020-01-19 DIAGNOSIS — E785 Hyperlipidemia, unspecified: Secondary | ICD-10-CM

## 2020-01-19 MED ORDER — TRULICITY 1.5 MG/0.5ML ~~LOC~~ SOAJ: 1.5000 mg | SUBCUTANEOUS | 6 refills | Status: AC

## 2020-01-19 MED ORDER — LANTUS SOLOSTAR 100 UNIT/ML ~~LOC~~ SOPN: 32.0000 [IU] | PEN_INJECTOR | Freq: Every day | SUBCUTANEOUS | 11 refills | Status: DC

## 2020-01-19 MED ORDER — INSULIN PEN NEEDLE 32G X 4 MM MISC: 1.0000 | Freq: Every day | 6 refills | Status: DC

## 2020-01-19 NOTE — Progress Notes (Signed)
Name: Amy Banks  Age/ Sex: 44 y.o., female   MRN/ DOB: [id number], March 14, 1976     PCP: [name], NP-C   Reason for Endocrinology Evaluation: Type 2 Diabetes Mellitus  Initial Endocrine Consultative Visit: 11/24/2019    PATIENT IDENTIFIER: Amy Banks is a 44 y.o. female with a past medical history of HTN, T2DM and Dyslipidemia. The patient has followed with Endocrinology clinic since 11/24/2019 for consultative assistance with management of her diabetes.  DIABETIC HISTORY:  Amy Banks was diagnosed with DM many years ago, Trulicity - slight headaches. Higher doses of metformin cause vomiting. Her hemoglobin A1c has ranged from 9.0% in 2018, peaking at 11.7%  in 2021  On her initial visit to our clinic she had an A1c of 11.7%. She was on Metformin and Lantus. We restarted Trulicity  SUBJECTIVE:   During the last visit (11/24/2019): A1c 11.7%. Increased lantus, continued metformin and restarted trulicity   Today (6/60/6301): Amy Banks is here for a follow up on diabetes management.  She checks her blood sugars 1 times daily, preprandial to breakfast The patient has not had hypoglycemic episodes since the last clinic visit.  Denies nausea or vomiting - has a headache after taking trulicity    HOME ENDOCRINE REGIMEN:  Lantus 34 units daily  Metformin 601 mg daily  Trulicity 0.93 mg weekly  Atorvastatin 10 mg daily     Statin: No  ACE-I/ARB: Yes    METER DOWNLOAD SUMMARY: Date range evaluated: 7/20-8/18/2021 Fingerstick Blood Glucose Tests = 16 Average Number Tests/Day = 0.5 Overall Mean FS Glucose = 133   BG Ranges: Low = 115 High = 161   Hypoglycemic Events/30 Days: BG < 50 = 0 Episodes of symptomatic severe hypoglycemia = 0    DIABETIC COMPLICATIONS: Microvascular complications:    Denies: CKD, retinopathy , neuropathy   Last eye exam: Completed 08/2019  Macrovascular complications:    Denies: CAD, PVD, CVA   HISTORY:  Past  Medical History:  Past Medical History:  Diagnosis Date  . Anemia   . Anxiety   . Depression   . Diabetes mellitus without complication ([place])   . Diverticulitis 08/2013  . Fibroids    tx w/ hormone meds  . History of blood transfusion 03/2016   at [facility]  . Hypertension   . Infertility, female    on hormone meds  . Morbid obesity ([place])    Past Surgical History:  Past Surgical History:  Procedure Laterality Date  . BREAST SURGERY  2001   reduction  . LAPAROTOMY Right 01/02/2015   Procedure: EXPLORATORY LAPAROTOMY WITH RIGHT OVARIAN CYSTECTOMY;  Surgeon: Marylynn Pearson, MD;  Location: [facility];  Service: Gynecology;  Laterality: Right;  . LAPAROTOMY N/A 06/13/2017   Procedure: MINI LAPAROTOMY;  Surgeon: Governor Specking, MD;  Location: [facility];  Service: Gynecology;  Laterality: N/A;  . LYSIS OF ADHESION N/A 01/02/2015   Procedure: LYSIS OF ADHESION;  Surgeon: Marylynn Pearson, MD;  Location: [facility];  Service: Gynecology;  Laterality: N/A;  . LYSIS OF ADHESION N/A 06/13/2017   Procedure: LYSIS OF ADHESION;  Surgeon: Governor Specking, MD;  Location: [facility];  Service: Gynecology;  Laterality: N/A;  . MYOMECTOMY  2009  . ROBOT ASSISTED MYOMECTOMY N/A 06/13/2017   Procedure: ATTEMPTED ROBOTIC ASSISTED MYOMECTOMY;  Surgeon: Governor Specking, MD;  Location: [facility];  Service: Gynecology;  Laterality: N/A;    Social History:  reports that she has never smoked. She has never used smokeless tobacco. She reports  that she does not drink alcohol and does not use drugs. Family History:  Family History  Problem Relation Age of Onset  . Diabetes Mother   . Hypertension Mother   . Diabetes Sister   . Diabetes Maternal Grandmother   . Hypertension Maternal Grandmother   . Heart disease Maternal Grandmother   . Hypertension Other   . Diabetes Other   . Breast cancer Other   . Ovarian cancer Other   . Cancer Maternal Aunt   . Heart disease Maternal Grandfather      HOME  MEDICATIONS: Allergies as of 01/19/2020      Reactions   Codeine Hives, Itching      Medication List       Accurate as of January 19, 2020  2:39 PM. If you have any questions, ask your nurse or doctor.        STOP taking these medications   blood glucose meter kit and supplies Stopped by: Dorita Sciara, MD   ONE TOUCH ULTRA 2 w/Device Kit Stopped by: Dorita Sciara, MD   OneTouch Ultra test strip Generic drug: glucose blood Stopped by: Dorita Sciara, MD   onetouch ultrasoft lancets Stopped by: Dorita Sciara, MD     TAKE these medications   amLODipine 10 MG tablet Commonly known as: NORVASC Take 1 tablet (10 mg total) by mouth daily.   atorvastatin 10 MG tablet Commonly known as: LIPITOR Take 1 tablet (10 mg total) by mouth daily.   Lantus SoloStar 100 UNIT/ML Solostar Pen Generic drug: insulin glargine Inject 34 Units into the skin daily.   losartan-hydrochlorothiazide 100-12.5 MG tablet Commonly known as: HYZAAR Take 1 tablet by mouth daily.   metFORMIN 750 MG 24 hr tablet Commonly known as: GLUCOPHAGE-XR Take 1 tablet (750 mg total) by mouth daily with breakfast.   miconazole nitrate Powd Commonly known as: MICATIN Apply 1 application topically 2 (two) times daily as needed.   Trulicity 5.46 EV/0.3JK Sopn Generic drug: Dulaglutide Inject 0.5 mLs (0.75 mg total) into the skin once a week.   VITAMIN D PO Take 1 tablet by mouth daily. gummies        OBJECTIVE:   Vital Signs: Ht '5\' 6"'  (1.676 m)   BMI 51.17 kg/m   Wt Readings from Last 3 Encounters:  12/07/19 (!) 317 lb (143.8 kg)  11/24/19 (!) 313 lb 3.2 oz (142.1 kg)  11/17/19 (!) 308 lb 9.6 oz (140 kg)     Exam: General: Pt appears well and is in NAD  Lungs: Clear with good BS bilat with no rales, rhonchi, or wheezes  Heart: RRR with normal S1 and S2 and no gallops; no murmurs; no rub  Abdomen: Normoactive bowel sounds, soft, nontender, without masses or  organomegaly palpable  Extremities: No pretibial edema.   Neuro: MS is good with appropriate affect, pt is alert and Ox3    DM foot exam: 11/24/2019  The skin of the feet is intact without sores or ulcerations. The pedal pulses are 2+ on right and 2+ on left. The sensation is intact to a screening 5.07, 10 gram monofilament bilaterally   DATA REVIEWED:  Lab Results  Component Value Date   HGBA1C 11.7 (H) 11/14/2019   HGBA1C 11.0 (H) 10/09/2018   HGBA1C 9.4 06/04/2017   Lab Results  Component Value Date   MICROALBUR 1.7 02/13/2017   LDLCALC 104 (H) 11/24/2019   CREATININE 0.69 11/24/2019   Lab Results  Component Value Date   MICRALBCREAT 8  02/13/2017     Lab Results  Component Value Date   CHOL 161 11/24/2019   HDL 35.00 (L) 11/24/2019   LDLCALC 104 (H) 11/24/2019   TRIG 111.0 11/24/2019   CHOLHDL 5 11/24/2019       Results for GEOFFREY, MANKIN (MRN [id number]) as of 01/21/2020 17:55  Ref. Range 01/19/2020 14:58  Creatinine,U Latest Units: mg/dL 125.2  Microalb, Ur Latest Ref Range: 0.0 - 1.9 mg/dL 2.5 (H)  MICROALB/CREAT RATIO Latest Ref Range: 0.0 - 30.0 mg/g 2.0    ASSESSMENT / PLAN / RECOMMENDATIONS:   1) Type 2 Diabetes Mellitus, Improving Glycemic control, Without complications - Most recent A1c of 11.7 %. Goal A1c < 7.0 %.    - Too soon to repeat A1c at this time. But in review of her glucose meter download , her BG's have been tremendously  improving.  - Will adjust her regimen as below   -  Intolerant to higher doses of metformin   MEDICATIONS:  - Decrease  Lantus to 32 units daily  - Continue Metformin 750 mg daily   - Increase Trulicity 1.5 mg weekly    EDUCATION / INSTRUCTIONS:  BG monitoring instructions: Patient is instructed to check her blood sugars 1 times a day  Call [facility] if: BG persistently < 70  . I reviewed the Rule of 15 for the treatment of hypoglycemia in detail with the patient. Literature  supplied.   2) Diabetic complications:   Eye: Does not have known diabetic retinopathy.   Neuro/ Feet: Does not have known diabetic peripheral neuropathy .   Renal: Patient does not have known baseline CKD. She   is  on an ACEI/ARB at present. Normal microalbuminuria    3) Dyslipidemia :  - NO side effects to Atorvastatin   Medication  Atorvastatin 10 mg daily     F/U in 3 months    Signed electronically by: Mack Guise, MD  [facility]  [address] [place], [place] [address] Phone: [phone] FAX: [phone]   CC: [name], NP-C [address] [address] Phone: [phone]  Fax: [phone]  Return to Endocrinology clinic as below: Future Appointments  Date Time Provider [place]  02/21/2020  2:15 PM [name], NP-C [facility] [place]  03/08/2020  8:40 AM O'Neal, Cassie Freer, MD [facility]

## 2020-01-19 NOTE — Patient Instructions (Addendum)
-   Decrease  Lantus to 32 units daily  - Continue Metformin 750 mg daily   - Increase Trulicity 1.5 mg weekly      Choose healthy, lower carb lower calorie snacks: toss salad, vegetables, cottage cheese, peanut butter, low fat cheese / string cheese, lower sodium deli meat, tuna salad or chicken salad     HOW TO TREAT LOW BLOOD SUGARS (Blood sugar LESS THAN 70 MG/DL)  Please follow the RULE OF 15 for the treatment of hypoglycemia treatment (when your (blood sugars are less than 70 mg/dL)    STEP 1: Take 15 grams of carbohydrates when your blood sugar is low, which includes:   3-4 GLUCOSE TABS  OR  3-4 OZ OF JUICE OR REGULAR SODA OR  ONE TUBE OF GLUCOSE GEL     STEP 2: RECHECK blood sugar in 15 MINUTES STEP 3: If your blood sugar is still low at the 15 minute recheck --> then, go back to STEP 1 and treat AGAIN with another 15 grams of carbohydrates.

## 2020-01-20 LAB — MICROALBUMIN / CREATININE URINE RATIO
Creatinine,U: 125.2 mg/dL
Microalb Creat Ratio: 2 mg/g (ref 0.0–30.0)
Microalb, Ur: 2.5 mg/dL — ABNORMAL HIGH (ref 0.0–1.9)

## 2020-02-10 ENCOUNTER — Encounter: Payer: Self-pay | Admitting: Family Medicine

## 2020-02-11 ENCOUNTER — Ambulatory Visit: Payer: BC Managed Care – PPO | Admitting: Family Medicine

## 2020-02-11 ENCOUNTER — Other Ambulatory Visit: Payer: Self-pay

## 2020-02-11 ENCOUNTER — Encounter: Payer: Self-pay | Admitting: Family Medicine

## 2020-02-11 VITALS — BP 130/90 | HR 87 | Wt 322.8 lb

## 2020-02-11 DIAGNOSIS — G8929 Other chronic pain: Secondary | ICD-10-CM

## 2020-02-11 DIAGNOSIS — M5442 Lumbago with sciatica, left side: Secondary | ICD-10-CM

## 2020-02-11 NOTE — Progress Notes (Signed)
   Subjective:    Patient ID: Amy Banks, female    DOB: 08-06-1975, 44 y.o.   MRN: [id number]  HPI Chief Complaint  Patient presents with  . sciatic pain    sciatic pain x week. pain increasing   Complains of left low back pain x 6 weeks on and off. States the pain radiates down the back of her left leg to her mid hamstring. Pain is sharp at times. Pain is worse when getting up to start walking.  History of similar pain several years ago.  Denies fever, chills, abdominal pain, N/V/D or urinary symptoms.   Has been using icy hot and taking Tylenol prn.   Using condoms and denies chance of pregnancy.  Sees endocrinology and cardiology   Home sleep study ordered by Dr. Farris Has.    Review of Systems Pertinent positives and negatives in the history of present illness.     Objective:   Physical Exam Constitutional:      General: She is not in acute distress.    Appearance: She is not ill-appearing.  Pulmonary:     Effort: Pulmonary effort is normal.  Musculoskeletal:     Cervical back: Normal, normal range of motion and neck supple.     Thoracic back: Normal.     Lumbar back: No tenderness. Normal range of motion. Negative right straight leg raise test and negative left straight leg raise test.     Comments: Midline lumbar pain with flexion. Non tender. Normal sensation and good ROM. Unable to reproduce her pain.   Skin:    General: Skin is warm and dry.     Capillary Refill: Capillary refill takes less than 2 seconds.  Neurological:     General: No focal deficit present.     Mental Status: She is alert and oriented to person, place, and time.     Cranial Nerves: No cranial nerve deficit.     Sensory: No sensory deficit.     Motor: No weakness.     Coordination: Coordination normal.     Gait: Gait normal.     Deep Tendon Reflexes: Reflexes normal.    BP 130/90   Pulse 87   Wt (!) 322 lb 12.8 oz (146.4 kg)   BMI 52.10 kg/m        Assessment & Plan:  Chronic  left-sided low back pain with left-sided sciatica - Plan: DG Lumbar Spine Complete, Ambulatory referral to Physical Therapy  No red flag symptoms. No pain today.  I will treat her conservatively with NSAIDs, topical pain medication and ice or heat. Lumbar X ray ordered and referral to PT.

## 2020-02-11 NOTE — Patient Instructions (Signed)
Take 1-2 Aleve  with food twice daily for the next week. You can continue using a topical pain medication and heat or ice.   I will be in touch with your x-ray results  I am also referring you to physical therapy and you will hear from them to schedule

## 2020-02-15 ENCOUNTER — Other Ambulatory Visit: Payer: Self-pay

## 2020-02-15 ENCOUNTER — Ambulatory Visit
Admission: RE | Admit: 2020-02-15 | Discharge: 2020-02-15 | Disposition: A | Discharge status: Home / Self Care | Payer: BC Managed Care – PPO | Source: Ambulatory Visit | Attending: Family Medicine | Admitting: Family Medicine

## 2020-02-15 DIAGNOSIS — M5442 Lumbago with sciatica, left side: Secondary | ICD-10-CM

## 2020-02-15 DIAGNOSIS — M545 Low back pain: Secondary | ICD-10-CM | POA: Diagnosis not present

## 2020-02-17 ENCOUNTER — Ambulatory Visit: Payer: BC Managed Care – PPO | Admitting: Family Medicine

## 2020-02-21 ENCOUNTER — Other Ambulatory Visit: Payer: Self-pay

## 2020-02-21 ENCOUNTER — Encounter: Payer: Self-pay | Admitting: Family Medicine

## 2020-02-21 ENCOUNTER — Ambulatory Visit: Payer: BC Managed Care – PPO | Admitting: Family Medicine

## 2020-02-21 ENCOUNTER — Encounter: Payer: Self-pay | Admitting: Internal Medicine

## 2020-02-21 MED ORDER — INSULIN PEN NEEDLE 32G X 4 MM MISC: 1.0000 | Freq: Every day | 6 refills | Status: AC

## 2020-02-26 ENCOUNTER — Other Ambulatory Visit: Payer: Self-pay | Admitting: Internal Medicine

## 2020-02-26 DIAGNOSIS — E1165 Type 2 diabetes mellitus with hyperglycemia: Secondary | ICD-10-CM

## 2020-03-06 NOTE — Progress Notes (Signed)
Cardiology Office Note:   Date:  03/08/2020  NAME:  Amy Banks    MRN: [id number] DOB:  [birth year]   PCP:  [name], NP-C  Cardiologist:  No primary care provider on file.  Electrophysiologist:  None   Referring MD: [name], NP-C   Chief Complaint  Patient presents with  . Follow-up   History of Present Illness:   Amy Banks is a 44 y.o. female with a hx of diabetes, hypertension, morbid obesity who presents for follow-up. Seen for high BP. Obesity and sleep apnea a concern.  She has not completed her sleep study.  Unclear where the breakdown was.  Blood pressure today is 153/97.  She did not take her medications today.  She is on amlodipine as well as losartan-HCTZ.  Weights are still going up around 323.  Most recent A1c not controlled.  Has plans to have this rechecked by her primary care physician.  Started on Lipitor as well.  We do not have a recheck either.  She is exercising 2 times per week.  She goes to the gym and uses the treadmill.  She reports she can exercise for up to 30 minutes to 1 hour without any chest pain or shortness of breath.  Still not losing the weight.  I do worry about sleep apnea and snoring.  She still snores and has fatigue daily.  We do have plans to complete a sleep study.  She has not kept a log of her blood pressure.  She will do this moving forward.  Overall she has primary hypertension due to morbid obesity and obstructive sleep apnea.  I suspect she is well controlled if she takes her medications.  She did not take it this morning.  Problem List 1. HTN -negative secondary work-up -OSA 2. DM2 -A1c 11.7 -T chol 161, HDL 35, LDL 104, TG 111 3. Obesity (BMI 49)  Past Medical History: Past Medical History:  Diagnosis Date  . Anemia   . Anxiety   . Depression   . Diabetes mellitus without complication ([place])   . Diverticulitis 08/2013  . Fibroids    tx w/ hormone meds  . History of blood transfusion 03/2016   at [facility]  . Hypertension   . Infertility, female    on hormone meds  . Morbid obesity ([place])     Past Surgical History: Past Surgical History:  Procedure Laterality Date  . BREAST SURGERY  2001   reduction  . LAPAROTOMY Right 01/02/2015   Procedure: EXPLORATORY LAPAROTOMY WITH RIGHT OVARIAN CYSTECTOMY;  Surgeon: Marylynn Pearson, MD;  Location: [facility];  Service: Gynecology;  Laterality: Right;  . LAPAROTOMY N/A 06/13/2017   Procedure: MINI LAPAROTOMY;  Surgeon: Governor Specking, MD;  Location: [facility];  Service: Gynecology;  Laterality: N/A;  . LYSIS OF ADHESION N/A 01/02/2015   Procedure: LYSIS OF ADHESION;  Surgeon: Marylynn Pearson, MD;  Location: [facility];  Service: Gynecology;  Laterality: N/A;  . LYSIS OF ADHESION N/A 06/13/2017   Procedure: LYSIS OF ADHESION;  Surgeon: Governor Specking, MD;  Location: [facility];  Service: Gynecology;  Laterality: N/A;  . MYOMECTOMY  2009  . ROBOT ASSISTED MYOMECTOMY N/A 06/13/2017   Procedure: ATTEMPTED ROBOTIC ASSISTED MYOMECTOMY;  Surgeon: Governor Specking, MD;  Location: [facility];  Service: Gynecology;  Laterality: N/A;    Current Medications: Current Meds  Medication Sig  . Accu-Chek FastClix Lancets MISC by Does not apply route.  Marland Kitchen amLODipine (NORVASC) 10 MG tablet Take  1 tablet (10 mg total) by mouth daily.  Marland Kitchen atorvastatin (LIPITOR) 10 MG tablet Take 1 tablet (10 mg total) by mouth daily.  . Blood Glucose Monitoring Suppl (ACCU-CHEK GUIDE) w/Device KIT by Does not apply route.  . Dulaglutide (TRULICITY) 1.5 JF/3.5KT SOPN Inject 0.5 mLs (1.5 mg total) into the skin once a week. To replace the 0.75 mg dose  . glucose blood (ACCU-CHEK GUIDE) test strip 1 each by Other route as needed for other. Use as instructed  . insulin glargine (LANTUS SOLOSTAR) 100 UNIT/ML Solostar Pen Inject 32 Units into the skin daily.  . Insulin Pen Needle 32G X 4 MM MISC 1 Device by Does not apply route daily.  Marland Kitchen losartan-hydrochlorothiazide (HYZAAR) 100-12.5 MG tablet Take 1  tablet by mouth daily.  . metFORMIN (GLUCOPHAGE-XR) 750 MG 24 hr tablet TAKE 1 TABLET(750 MG) BY MOUTH DAILY WITH BREAKFAST  . miconazole nitrate (MICATIN) POWD Apply 1 application topically 2 (two) times daily as needed.  Marland Kitchen VITAMIN D PO Take 1 tablet by mouth daily. gummies     Allergies:    Codeine   Social History: Social History   Socioeconomic History  . Marital status: Married    Spouse name: Not on file  . Number of children: Not on file  . Years of education: Not on file  . Highest education level: Not on file  Occupational History  . Not on file  Tobacco Use  . Smoking status: Never Smoker  . Smokeless tobacco: Never Used  Vaping Use  . Vaping Use: Never used  Substance and Sexual Activity  . Alcohol use: No  . Drug use: No  . Sexual activity: Yes    Birth control/protection: None  Other Topics Concern  . 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 Difficulty of Paying Living Expenses: Not on file  Food Insecurity:   . Worried About Charity fundraiser in the Last Year: Not on file  . Ran Out of Food in the Last Year: Not on file  Transportation Needs:   . Lack of Transportation (Medical): Not on file  . Lack of Transportation (Non-Medical): Not on file  Physical Activity:   . Days of Exercise per Week: Not on file  . Minutes of Exercise per Session: Not on file  Stress:   . Feeling of Stress : Not on file  Social Connections:   . Frequency of Communication with Friends and Family: Not on file  . Frequency of Social Gatherings with Friends and Family: Not on file  . Attends Religious Services: Not on file  . Active Member of Clubs or Organizations: Not on file  . Attends Archivist Meetings: Not on file  . Marital Status: Not on file     Family  History: The patient's family history includes Breast cancer in an other family member; Cancer in her maternal aunt; Diabetes in her maternal grandmother, mother, sister, and another family member; Heart disease in her maternal grandfather and maternal grandmother; Hypertension in her maternal grandmother, mother, and another family member; Ovarian cancer in an other family member.  ROS:   All other ROS reviewed and negative. Pertinent positives noted in the HPI.     EKGs/Labs/Other Studies Reviewed:   The following studies were personally reviewed by  me today:  TTE 11/14/2019 1. Left ventricular ejection fraction, by estimation, is 55 to 60%. The  left ventricle has normal function. The left ventricle has no regional  wall motion abnormalities. There is severe left ventricular hypertrophy.  Left ventricular diastolic parameters  were normal.  2. Right ventricular systolic function is normal. The right ventricular  size is normal.  3. The mitral valve is normal in structure. Trivial mitral valve  regurgitation. No evidence of mitral stenosis.  4. The aortic valve is tricuspid. Aortic valve regurgitation is not  visualized. Mild aortic valve sclerosis is present, with no evidence of  aortic valve stenosis.  5. The inferior vena cava is normal in size with greater than 50%  respiratory variability, suggesting right atrial pressure of 3 mmHg.   Recent Labs: 11/14/2019: B Natriuretic Peptide 29.5; Hemoglobin 13.8; Magnesium 2.0; Platelets 368 11/24/2019: ALT 16; BUN 11; Creatinine, Ser 0.69; Potassium 3.9; Sodium 134   Recent Lipid Panel    Component Value Date/Time   CHOL 161 11/24/2019 1354   CHOL 203 (H) 10/09/2018 1440   TRIG 111.0 11/24/2019 1354   HDL 35.00 (L) 11/24/2019 1354   HDL 38 (L) 10/09/2018 1440   CHOLHDL 5 11/24/2019 1354   VLDL 22.2 11/24/2019 1354   LDLCALC 104 (H) 11/24/2019 1354   LDLCALC 142 (H) 10/09/2018 1440    Physical Exam:   VS:  BP (!) 153/97    Pulse 85   Ht 5' 5" (1.651 m)   Wt (!) 323 lb 6.4 oz (146.7 kg)   SpO2 99%   BMI 53.82 kg/m    Wt Readings from Last 3 Encounters:  03/08/20 (!) 323 lb 6.4 oz (146.7 kg)  02/11/20 (!) 322 lb 12.8 oz (146.4 kg)  01/19/20 (!) 321 lb 9.6 oz (145.9 kg)    General: Obese female, no acute distress Heart: Atraumatic, normal size  Eyes: PEERLA, EOMI  Neck: Supple, no JVD Endocrine: No thryomegaly Cardiac: Normal S1, S2; RRR; no murmurs, rubs, or gallops Lungs: Clear to auscultation bilaterally, no wheezing, rhonchi or rales  Abd: Soft, nontender, no hepatomegaly  Ext: No edema, pulses 2+ Musculoskeletal: No deformities, BUE and BLE strength normal and equal Skin: Warm and dry, no rashes   Neuro: Alert and oriented to person, place, time, and situation, CNII-XII grossly intact, no focal deficits  Psych: Normal mood and affect   ASSESSMENT:   Amy Banks is a 44 y.o. female who presents for the following: 1. Primary hypertension   2. Mixed hyperlipidemia   3. Obesity, morbid, BMI 50 or higher ([place])   4. Snoring   5. Chronic fatigue     PLAN:   1. Primary hypertension -Negative renal and Aldo screen.  Suspect this is all secondary to morbid obesity and untreated sleep apnea.  BP 153/97 but did not take medication.  She will continue her Norvasc 10 mg daily, losartan 100-12.5 mg daily.  I have asked her to keep a blood pressure log. -Needs sleep study.  See below. -I have also recommended referral to the healthy weight and wellness clinic.  She should likely consider bariatric surgery to cure her diabetes improve her blood pressure.  She is young.  2. Mixed hyperlipidemia -Continue Lipitor.  Recently started.  We will see with a recheck lipid profile shows.  LDL cholesterol should be less than 70.  3. Obesity, morbid, BMI 50 or higher ([place]) -Referral to healthy weight and wellness clinic.  She should strongly consider bariatric surgery.  4.  Snoring 5. Chronic fatigue -Home  sleep study.   Disposition: Return in about 6 months (around 09/06/2020).  Medication Adjustments/Labs and Tests Ordered: Current medicines are reviewed at length with the patient today.  Concerns regarding medicines are outlined above.  Orders Placed This Encounter  Procedures  . Amb Ref to Medical Weight Management   No orders of the defined types were placed in this encounter.   Patient Instructions  Medication Instructions:  The current medical regimen is effective;  continue present plan and medications.  *If you need a refill on your cardiac medications before your next appointment, please call your pharmacy*   Follow-Up: At [facility], you and your health needs are our priority.  As part of our continuing mission to provide you with exceptional heart care, we have created designated Provider Care Teams.  These Care Teams include your primary Cardiologist (physician) and Advanced Practice Providers (APPs -  Physician Assistants and Nurse Practitioners) who all work together to provide you with the care you need, when you need it.  We recommend signing up for the patient portal called "MyChart".  Sign up information is provided on this After Visit Summary.  MyChart is used to connect with patients for Virtual Visits (Telemedicine).  Patients are able to view lab/test results, encounter notes, upcoming appointments, etc.  Non-urgent messages can be sent to your provider as well.   To learn more about what you can do with MyChart, go to [email or web address].    Your next appointment:   6 month(s)  The format for your next appointment:   In Person  Provider:   Eleonore Chiquito, MD   Other Instructions Keep BP log-  Referral to Healthy weight and wellness, they will contact you for an appointment.      Time Spent with Patient: I have spent a total of 35 minutes with patient reviewing hospital notes, telemetry, EKGs, labs and examining the patient as well as establishing  an assessment and plan that was discussed with the patient.  > 50% of time was spent in direct patient care.  Signed, Addison Naegeli. Audie Box, [place]  [address] [place], [place] [address] [phone]  03/08/2020 10:09 AM

## 2020-03-08 ENCOUNTER — Encounter: Payer: Self-pay | Admitting: Cardiovascular Disease

## 2020-03-08 ENCOUNTER — Ambulatory Visit: Payer: BC Managed Care – PPO | Admitting: Cardiovascular Disease

## 2020-03-08 ENCOUNTER — Telehealth: Payer: Self-pay | Admitting: Cardiovascular Disease

## 2020-03-08 ENCOUNTER — Other Ambulatory Visit: Payer: Self-pay | Admitting: *Deleted

## 2020-03-08 ENCOUNTER — Telehealth: Payer: Self-pay | Admitting: *Deleted

## 2020-03-08 ENCOUNTER — Other Ambulatory Visit: Payer: Self-pay

## 2020-03-08 ENCOUNTER — Encounter: Payer: Self-pay | Admitting: Internal Medicine

## 2020-03-08 VITALS — BP 153/97 | HR 85 | Ht 65.0 in | Wt 323.4 lb

## 2020-03-08 DIAGNOSIS — I10 Essential (primary) hypertension: Secondary | ICD-10-CM

## 2020-03-08 DIAGNOSIS — R5382 Chronic fatigue, unspecified: Secondary | ICD-10-CM

## 2020-03-08 DIAGNOSIS — E782 Mixed hyperlipidemia: Secondary | ICD-10-CM

## 2020-03-08 DIAGNOSIS — R0683 Snoring: Secondary | ICD-10-CM | POA: Diagnosis not present

## 2020-03-08 MED ORDER — ATORVASTATIN CALCIUM 10 MG PO TABS
10.0000 mg | ORAL_TABLET | Freq: Every day | ORAL | 2 refills | Status: DC
Start: 2020-03-08 — End: 2020-06-07

## 2020-03-08 MED ORDER — AMLODIPINE BESYLATE 10 MG PO TABS: 10.0000 mg | ORAL_TABLET | Freq: Every day | ORAL | 2 refills | Status: DC

## 2020-03-08 MED ORDER — LOSARTAN POTASSIUM-HCTZ 100-12.5 MG PO TABS: 1.0000 | ORAL_TABLET | Freq: Every day | ORAL | 2 refills | Status: DC

## 2020-03-08 NOTE — Telephone Encounter (Signed)
Per [employer] a prior Josem Kaufmann is not needed for the home sleep study.  Call ref # [id number]

## 2020-03-08 NOTE — Telephone Encounter (Signed)
Patient notified of HST appointment details. °

## 2020-03-08 NOTE — Patient Instructions (Signed)
Medication Instructions:  The current medical regimen is effective;  continue present plan and medications.  *If you need a refill on your cardiac medications before your next appointment, please call your pharmacy*   Follow-Up: At [facility], you and your health needs are our priority.  As part of our continuing mission to provide you with exceptional heart care, we have created designated Provider Care Teams.  These Care Teams include your primary Cardiologist (physician) and Advanced Practice Providers (APPs -  Physician Assistants and Nurse Practitioners) who all work together to provide you with the care you need, when you need it.  We recommend signing up for the patient portal called "MyChart".  Sign up information is provided on this After Visit Summary.  MyChart is used to connect with patients for Virtual Visits (Telemedicine).  Patients are able to view lab/test results, encounter notes, upcoming appointments, etc.  Non-urgent messages can be sent to your provider as well.   To learn more about what you can do with MyChart, go to [email or web address].    Your next appointment:   6 month(s)  The format for your next appointment:   In Person  Provider:   Eleonore Chiquito, MD   Other Instructions Keep BP log-  Referral to Healthy weight and wellness, they will contact you for an appointment.

## 2020-03-10 MED ORDER — LANTUS SOLOSTAR 100 UNIT/ML ~~LOC~~ SOPN: 32.0000 [IU] | PEN_INJECTOR | Freq: Every day | SUBCUTANEOUS | 3 refills | Status: DC

## 2020-04-12 ENCOUNTER — Other Ambulatory Visit: Payer: Self-pay

## 2020-04-12 ENCOUNTER — Ambulatory Visit (HOSPITAL_BASED_OUTPATIENT_CLINIC_OR_DEPARTMENT_OTHER): Payer: BC Managed Care – PPO | Attending: Cardiovascular Disease | Admitting: Cardiovascular Disease

## 2020-04-12 DIAGNOSIS — R0902 Hypoxemia: Secondary | ICD-10-CM | POA: Diagnosis not present

## 2020-04-12 DIAGNOSIS — R5382 Chronic fatigue, unspecified: Secondary | ICD-10-CM | POA: Insufficient documentation

## 2020-04-12 DIAGNOSIS — R0683 Snoring: Secondary | ICD-10-CM | POA: Insufficient documentation

## 2020-04-12 DIAGNOSIS — I10 Essential (primary) hypertension: Secondary | ICD-10-CM | POA: Insufficient documentation

## 2020-04-12 DIAGNOSIS — G4733 Obstructive sleep apnea (adult) (pediatric): Secondary | ICD-10-CM | POA: Insufficient documentation

## 2020-04-19 DIAGNOSIS — Z1231 Encounter for screening mammogram for malignant neoplasm of breast: Secondary | ICD-10-CM | POA: Diagnosis not present

## 2020-04-24 ENCOUNTER — Encounter (HOSPITAL_BASED_OUTPATIENT_CLINIC_OR_DEPARTMENT_OTHER): Payer: Self-pay | Admitting: Cardiovascular Disease

## 2020-04-24 NOTE — Procedures (Signed)
     Patient Name: Amy Banks, Amy Banks Date: 04/12/2020 Gender: Female D.O.B: 02/09/1976 Age (years): 44 Referring Provider: Cassie Freer ONeal Height (inches): 66 Interpreting Physician: Shelva Majestic MD, ABSM Weight (lbs): 320 RPSGT: Jacolyn Reedy BMI: 52 MRN: [id number] Neck Size: 20.00  CLINICAL INFORMATION Sleep Study Type: HST  Indication for sleep study: snoring, fatigue, non-restorative sleep.  Epworth Sleepiness Score: 5  SLEEP STUDY TECHNIQUE A multi-channel overnight portable sleep study was performed. The channels recorded were: nasal airflow, thoracic respiratory movement, and oxygen saturation with a pulse oximetry. Snoring was also monitored.  MEDICATIONS Patient self administered medications include: N/A.  SLEEP ARCHITECTURE Patient was studied for 561.4 minutes. The sleep efficiency was 100.0 % and the patient was supine for 73.4%. The arousal index was 0.0 per hour.  RESPIRATORY PARAMETERS The overall AHI was 11.3 per hour, with a central apnea index of 0.0 per hour. There is a positional component with supine sleep AHI 14.9/h versus non-supine sleep AHI 2.0/h.  The oxygen nadir was 81% during sleep.  CARDIAC DATA Mean heart rate during sleep was 74.4 bpm.  IMPRESSIONS - Mild obstructive sleep apnea occurred during this study (AHI 11.3/h). However, the  severity during REM sleep cannot be assessed on this home study. - No significant central sleep apnea occurred during this study (CAI 0.0/h). - Moderate oxygen desaturation to a nadir of 81%. - Patient snored 2.5% during the sleep.  DIAGNOSIS - Obstructive Sleep Apnea (G47.33) - Nocturnal Hypoxemia (G47.36)  RECOMMENDATIONS - Therapeutic CPAP titration to determine optimal pressure required to alleviate sleep disordered breathing. - Effort should be made to optimize nasal and orpharyngeal patency. - The patient should be advisd to avoid supine sleep; consider positional therapy avoiding  supine position during sleep. - If patient is against CPAP a customized oral appliance may be considered. - Avoid alcohol, sedatives and other CNS depressants that may worsen sleep apnea and disrupt normal sleep architecture. - Sleep hygiene should be reviewed to assess factors that may improve sleep quality. - Weight management (BMI 52) and regular exercise should be initiated or continued. - Recommend a download after CPAP initiation and sleep clinic evaluation.    [Electronically signed] 04/24/2020 07:18 AM  Shelva Majestic MD, [facility], [place], American Board of Sleep Medicine   NPI: [id number] [place] PH: [phone]   FX: [phone] [place]

## 2020-04-25 ENCOUNTER — Ambulatory Visit: Payer: BC Managed Care – PPO | Admitting: Internal Medicine

## 2020-04-30 ENCOUNTER — Telehealth: Payer: Self-pay | Admitting: *Deleted

## 2020-04-30 DIAGNOSIS — G4733 Obstructive sleep apnea (adult) (pediatric): Secondary | ICD-10-CM

## 2020-04-30 NOTE — Telephone Encounter (Signed)
-----   Message from Troy Sine, MD sent at 04/24/2020  7:24 AM EST ----- Gae Bon, please notify pt of the results. Rec CPAP, preferably in-lab, but if cannot get approved then Auto PAP with EPR of 3 at 6-18 cm to start.

## 2020-04-30 NOTE — Telephone Encounter (Addendum)
Informed patient of sleep study results and patient understanding was verbalized. Patient understands her sleep study showed  IMPRESSIONS - Mild obstructive sleep apnea occurred during this study (AHI 11.3/h). However, the  severity during REM sleep cannot be assessed on this home study. - Moderate oxygen desaturation to a nadir of 81%. - Patient snored 2.5% during the sleep.  DIAGNOSIS - Obstructive Sleep Apnea (G47.33) - Nocturnal Hypoxemia (G47.36)  RECOMMENDATIONS - Therapeutic CPAP titration to determine optimal pressure required to alleviate sleep disordered breathing.  Titration sent to sleep pool PER DPR Left detailed message on voicemail and informed patient to call back with questions.

## 2020-05-03 ENCOUNTER — Encounter: Payer: Self-pay | Admitting: Family Medicine

## 2020-05-03 ENCOUNTER — Other Ambulatory Visit: Payer: Self-pay

## 2020-05-03 ENCOUNTER — Encounter: Payer: Self-pay | Admitting: Medical

## 2020-05-03 ENCOUNTER — Ambulatory Visit: Payer: BC Managed Care – PPO | Admitting: Medical

## 2020-05-03 VITALS — BP 162/100 | HR 95 | Ht 66.0 in | Wt 334.6 lb

## 2020-05-03 DIAGNOSIS — H6590 Unspecified nonsuppurative otitis media, unspecified ear: Secondary | ICD-10-CM

## 2020-05-03 DIAGNOSIS — I10 Essential (primary) hypertension: Secondary | ICD-10-CM | POA: Diagnosis not present

## 2020-05-03 DIAGNOSIS — E1165 Type 2 diabetes mellitus with hyperglycemia: Secondary | ICD-10-CM | POA: Diagnosis not present

## 2020-05-03 MED ORDER — AMOXICILLIN 875 MG PO TABS: 875.0000 mg | ORAL_TABLET | Freq: Two times a day (BID) | ORAL | 0 refills | Status: DC

## 2020-05-03 NOTE — Progress Notes (Signed)
Subjective:  Amy Banks is a 44 y.o. female who presents for Chief Complaint  Patient presents with  . Ear Pain    bilateral x1 day      Here for ear pain.  Started a few days ago.  Has a little nasal drippage.  No fever, no cough, no NVD, no dizziness, no teeth or sinus pain.  No drainage, no pus.   Using OTC ear drops.    She notes that she is not checking home blood pressure readings.  She forgot to take her medicine yesterday.  She notes good compliance otherwise.  No chest pain no shortness of breath no swelling in the legs, no paresthesias  No other aggravating or relieving factors.    No other c/o.  The following portions of the patient's history were reviewed and updated as appropriate: allergies, current medications, past family history, past medical history, past social history, past surgical history and problem list.  ROS Otherwise as in subjective above  Objective: BP (!) 162/100   Pulse 95   Ht 5\' 6"  (1.676 m)   Wt (!) 334 lb 9.6 oz (151.8 kg)   SpO2 97%   BMI 54.01 kg/m   BP Readings from Last 3 Encounters:  05/03/20 (!) 162/100  03/08/20 (!) 153/97  02/11/20 130/90   General appearance: alert, no distress, well developed, well nourished HEENT: normocephalic, sclerae anicteric, conjunctiva pink and moist, bilateral TMs with erythema and retraction worse on the left,, nares patent, no discharge or erythema, pharynx normal Oral cavity: MMM, no lesions Neck: supple, no lymphadenopathy, no thyromegaly, no masses Heart: RRR, normal S1, S2, no murmurs Lungs: CTA bilaterally, no wheezes, rhonchi, or rales Pulses: 2+ radial pulses, 2+ pedal pulses, normal cap refill Ext: no edema   Assessment: Encounter Diagnoses  Name Primary?  . Other nonsuppurative otitis media, unspecified chronicity, unspecified laterality Yes  . Essential hypertension   . Uncontrolled type 2 diabetes mellitus with hyperglycemia, without long-term current use of insulin (HCC)       Plan: Begin amoxicillin for otitis media.  If not much improved within the next 7 days then call or recheck  Hypertension, snoring-follow-up with cardiology and sleep study as planned  Amy Banks was seen today for ear pain.  Diagnoses and all orders for this visit:  Other nonsuppurative otitis media, unspecified chronicity, unspecified laterality  Essential hypertension  Uncontrolled type 2 diabetes mellitus with hyperglycemia, without long-term current use of insulin (HCC)  Other orders -     amoxicillin (AMOXIL) 875 MG tablet; Take 1 tablet (875 mg total) by mouth 2 (two) times daily.    Follow up: As needed

## 2020-05-05 NOTE — Telephone Encounter (Signed)
no PA required; Tracking ID [id number]

## 2020-05-30 ENCOUNTER — Other Ambulatory Visit: Payer: Self-pay | Admitting: Internal Medicine

## 2020-05-30 DIAGNOSIS — E1165 Type 2 diabetes mellitus with hyperglycemia: Secondary | ICD-10-CM

## 2020-06-04 ENCOUNTER — Encounter (HOSPITAL_BASED_OUTPATIENT_CLINIC_OR_DEPARTMENT_OTHER): Payer: BC Managed Care – PPO | Admitting: Cardiovascular Disease

## 2020-06-06 ENCOUNTER — Other Ambulatory Visit: Payer: Self-pay

## 2020-06-06 ENCOUNTER — Ambulatory Visit: Payer: BC Managed Care – PPO | Admitting: Internal Medicine

## 2020-06-06 ENCOUNTER — Encounter: Payer: Self-pay | Admitting: Internal Medicine

## 2020-06-06 VITALS — BP 156/92 | HR 90 | Ht 66.0 in | Wt 338.1 lb

## 2020-06-06 DIAGNOSIS — E785 Hyperlipidemia, unspecified: Secondary | ICD-10-CM | POA: Diagnosis not present

## 2020-06-06 DIAGNOSIS — E1165 Type 2 diabetes mellitus with hyperglycemia: Secondary | ICD-10-CM | POA: Diagnosis not present

## 2020-06-06 LAB — POCT GLYCOSYLATED HEMOGLOBIN (HGB A1C): Hemoglobin A1C: 8 % — AB (ref 4.0–5.6)

## 2020-06-06 LAB — GLUCOSE, POCT (MANUAL RESULT ENTRY): POC Glucose: 203 mg/dl — AB (ref 70–99)

## 2020-06-06 MED ORDER — DEXCOM G6 SENSOR MISC: 1.0000 | 3 refills | Status: AC

## 2020-06-06 MED ORDER — DEXCOM G6 TRANSMITTER MISC: 1.0000 | 3 refills | Status: AC

## 2020-06-06 NOTE — Progress Notes (Unsigned)
Name: Amy Banks  Age/ Sex: 45 y.o., female   MRN/ DOB: [id number], 1976/03/24     PCP: [name], NP-C   Reason for Endocrinology Evaluation: Type 2 Diabetes Mellitus  Initial Endocrine Consultative Visit: 11/24/2019    PATIENT IDENTIFIER: Amy Banks is a 45 y.o. female with a past medical history of HTN, T2DM and Dyslipidemia. The patient has followed with Endocrinology clinic since 11/24/2019 for consultative assistance with management of her diabetes.  DIABETIC HISTORY:  Amy Banks was diagnosed with DM many years ago, Trulicity - slight headaches. Higher doses of metformin cause vomiting. Her hemoglobin A1c has ranged from 9.0% in 2018, peaking at 11.7%  in 2021  On her initial visit to our clinic she had an A1c of 11.7%. She was on Metformin and Lantus. We restarted Trulicity  SUBJECTIVE:   During the last visit (01/19/2020): A1c 11.7%. Decrease Lantus, continued metformin and Increased  trulicity     Today (08/09/1827): Amy Banks is here for a follow up on diabetes management.  She checks her blood sugars 0 times daily. The patient has not had hypoglycemic episodes since the last clinic visit.  Has occasional nausea but no diarrhea , has occasional heartburn       HOME ENDOCRINE REGIMEN:  Lantus 32 units daily  Metformin 937 mg daily  Trulicity 1.5  mg weekly  Atorvastatin 10 mg daily     Statin: Yes  ACE-I/ARB: Yes    METER DOWNLOAD SUMMARY: did not bring      DIABETIC COMPLICATIONS: Microvascular complications:    Denies: CKD, retinopathy , neuropathy   Last eye exam: Completed 08/2019  Macrovascular complications:    Denies: CAD, PVD, CVA   HISTORY:  Past Medical History:  Past Medical History:  Diagnosis Date  . Anemia   . Anxiety   . Depression   . Diabetes mellitus without complication ([place])   . Diverticulitis 08/2013  . Fibroids    tx w/ hormone meds  . History of blood transfusion 03/2016   at [facility]   . Hypertension   . Infertility, female    on hormone meds  . Morbid obesity ([place])    Past Surgical History:  Past Surgical History:  Procedure Laterality Date  . BREAST SURGERY  2001   reduction  . LAPAROTOMY Right 01/02/2015   Procedure: EXPLORATORY LAPAROTOMY WITH RIGHT OVARIAN CYSTECTOMY;  Surgeon: Marylynn Pearson, MD;  Location: [facility];  Service: Gynecology;  Laterality: Right;  . LAPAROTOMY N/A 06/13/2017   Procedure: MINI LAPAROTOMY;  Surgeon: Governor Specking, MD;  Location: [facility];  Service: Gynecology;  Laterality: N/A;  . LYSIS OF ADHESION N/A 01/02/2015   Procedure: LYSIS OF ADHESION;  Surgeon: Marylynn Pearson, MD;  Location: [facility];  Service: Gynecology;  Laterality: N/A;  . LYSIS OF ADHESION N/A 06/13/2017   Procedure: LYSIS OF ADHESION;  Surgeon: Governor Specking, MD;  Location: [facility];  Service: Gynecology;  Laterality: N/A;  . MYOMECTOMY  2009  . ROBOT ASSISTED MYOMECTOMY N/A 06/13/2017   Procedure: ATTEMPTED ROBOTIC ASSISTED MYOMECTOMY;  Surgeon: Governor Specking, MD;  Location: [facility];  Service: Gynecology;  Laterality: N/A;    Social History:  reports that she has never smoked. She has never used smokeless tobacco. She reports that she does not drink alcohol and does not use drugs. Family History:  Family History  Problem Relation Age of Onset  . Diabetes Mother   . Hypertension Mother   . Diabetes Sister   . Diabetes  Maternal Grandmother   . Hypertension Maternal Grandmother   . Heart disease Maternal Grandmother   . Hypertension Other   . Diabetes Other   . Breast cancer Other   . Ovarian cancer Other   . Cancer Maternal Aunt   . Heart disease Maternal Grandfather      HOME MEDICATIONS: Allergies as of 06/06/2020      Reactions   Codeine Hives, Itching      Medication List       Accurate as of June 06, 2020  3:19 PM. If you have any questions, ask your nurse or doctor.        STOP taking these medications   amoxicillin 875 MG tablet Commonly  known as: AMOXIL Stopped by: Dorita Sciara, MD     TAKE these medications   Accu-Chek FastClix Lancets Misc by Does not apply route.   Accu-Chek Guide test strip Generic drug: glucose blood 1 each by Other route as needed for other. Use as instructed   Accu-Chek Guide w/Device Kit by Does not apply route.   amLODipine 10 MG tablet Commonly known as: NORVASC Take 1 tablet (10 mg total) by mouth daily.   atorvastatin 10 MG tablet Commonly known as: LIPITOR Take 1 tablet (10 mg total) by mouth daily.   Insulin Pen Needle 32G X 4 MM Misc 1 Device by Does not apply route daily.   Lantus SoloStar 100 UNIT/ML Solostar Pen Generic drug: insulin glargine Inject 32 Units into the skin daily.   losartan-hydrochlorothiazide 100-12.5 MG tablet Commonly known as: HYZAAR Take 1 tablet by mouth daily.   metFORMIN 750 MG 24 hr tablet Commonly known as: GLUCOPHAGE-XR TAKE 1 TABLET(750 MG) BY MOUTH DAILY WITH BREAKFAST   miconazole nitrate Powd Commonly known as: MICATIN Apply 1 application topically 2 (two) times daily as needed.   Trulicity 1.5 GQ/6.7YP Sopn Generic drug: Dulaglutide Inject 0.5 mLs (1.5 mg total) into the skin once a week. To replace the 0.75 mg dose   VITAMIN D PO Take 1 tablet by mouth daily. gummies        OBJECTIVE:   Vital Signs: BP (!) 156/92   Pulse 90   Ht _0  (1.676 m)   Wt (!) 338 lb 2 oz (153.4 kg)   LMP 05/10/2020   SpO2 98%   BMI 54.57 kg/m   Wt Readings from Last 3 Encounters:  06/06/20 (!) 338 lb 2 oz (153.4 kg)  05/03/20 (!) 334 lb 9.6 oz (151.8 kg)  04/12/20 (!) 320 lb (145.2 kg)     Exam: General: Pt appears well and is in NAD  Lungs: Clear with good BS bilat with no rales, rhonchi, or wheezes  Heart: RRR with normal S1 and S2 and no gallops; no murmurs; no rub  Abdomen: Normoactive bowel sounds, soft, nontender, without masses or organomegaly palpable  Extremities: No pretibial edema.   Neuro: MS is good with  appropriate affect, pt is alert and Ox3    DM foot exam: 11/24/2019  The skin of the feet is intact without sores or ulcerations. The pedal pulses are 2+ on right and 2+ on left. The sensation is intact to a screening 5.07, 10 gram monofilament bilaterally     DATA REVIEWED:  Lab Results  Component Value Date   HGBA1C 8.0 (A) 06/06/2020   HGBA1C 11.7 (H) 11/14/2019   HGBA1C 11.0 (H) 10/09/2018    Results for Amy Banks, Amy Banks (MRN [id number]) as of 06/07/2020 14:13  Ref. Range 06/06/2020 15:37  Sodium Latest Ref Range: 135 - 145 mEq/L 136  Potassium Latest Ref Range: 3.5 - 5.1 mEq/L 3.7  Chloride Latest Ref Range: 96 - 112 mEq/L 99  CO2 Latest Ref Range: 19 - 32 mEq/L 25  Glucose Latest Ref Range: 70 - 99 mg/dL 181 (H)  BUN Latest Ref Range: 6 - 23 mg/dL 9  Creatinine Latest Ref Range: 0.40 - 1.20 mg/dL 0.75  Calcium Latest Ref Range: 8.4 - 10.5 mg/dL 9.9  GFR Latest Ref Range: >60.00 mL/min 97.90  Total CHOL/HDL Ratio Unknown 4  Cholesterol Latest Ref Range: 0 - 200 mg/dL 174  HDL Cholesterol Latest Ref Range: >39.00 mg/dL 39.40  LDL (calc) Latest Ref Range: 0 - 99 mg/dL 108 (H)  MICROALB/CREAT RATIO Latest Ref Range: 0.0 - 30.0 mg/g 0.6  NonHDL Unknown 134.98  Triglycerides Latest Ref Range: 0.0 - 149.0 mg/dL 134.0  VLDL Latest Ref Range: 0.0 - 40.0 mg/dL 26.8  Creatinine,U Latest Units: mg/dL 183.8  Microalb, Ur Latest Ref Range: 0.0 - 1.9 mg/dL 1.2    ASSESSMENT / PLAN / RECOMMENDATIONS:   1) Type 2 Diabetes Mellitus, Improving Glycemic control, Without complications - Most recent A1c of 8.0 %. Goal A1c < 7.0 %.   - I have congratulated her on the dramatic improvement in glycemic control, A1c down from 11.7 % to 8.0 %  - We discussed regular exercise which she is motivated to do  -  Intolerant to higher doses of metformin  - No changes today  - After some hesitation on her part she agreed to try Dexcom , if its covered by her insurance  MEDICATIONS: - Continue   Lantus 32 units daily  - Continue Metformin 750 mg daily   - Increase Trulicity 1.5 mg weekly    EDUCATION / INSTRUCTIONS:  BG monitoring instructions: Patient is instructed to check her blood sugars 1 times a day  Call [place] [facility] if: BG persistently < 70  . I reviewed the Rule of 15 for the treatment of hypoglycemia in detail with the patient. Literature supplied.   2) Diabetic complications:   Eye: Does not have known diabetic retinopathy.   Neuro/ Feet: Does not have known diabetic peripheral neuropathy .   Renal: Patient does not have known baseline CKD. She   is  on an ACEI/ARB at present. Normal microalbuminuria    3) Dyslipidemia :   - Tolerating Atorvastatin without sie effects, repeat lipid profile continues to show high LDL , will increase the dose as below    Medication  Increase Atorvastatin to 20  mg daily     F/U in 3 months    Signed electronically by: Mack Guise, MD  [facility] Endocrinology  [address], [place] [address] Phone: [phone] FAX: [phone]   CC: [name], NP-C [address] [place] [address] Phone: [phone]  Fax: [phone]  Return to Endocrinology clinic as below: No future appointments.

## 2020-06-06 NOTE — Patient Instructions (Addendum)
-   Keep up the Good Work ! - Continue  Lantus  32 units daily  - Continue Metformin 750 mg daily   - Continue  Trulicity 1.5 mg weekly      HOW TO TREAT LOW BLOOD SUGARS (Blood sugar LESS THAN 70 MG/DL)  Please follow the RULE OF 15 for the treatment of hypoglycemia treatment (when your (blood sugars are less than 70 mg/dL)    STEP 1: Take 15 grams of carbohydrates when your blood sugar is low, which includes:   3-4 GLUCOSE TABS  OR  3-4 OZ OF JUICE OR REGULAR SODA OR  ONE TUBE OF GLUCOSE GEL     STEP 2: RECHECK blood sugar in 15 MINUTES STEP 3: If your blood sugar is still low at the 15 minute recheck --> then, go back to STEP 1 and treat AGAIN with another 15 grams of carbohydrates.

## 2020-06-07 ENCOUNTER — Encounter: Payer: Self-pay | Admitting: Internal Medicine

## 2020-06-07 LAB — BASIC METABOLIC PANEL
BUN: 9 mg/dL (ref 6–23)
CO2: 25 mEq/L (ref 19–32)
Calcium: 9.9 mg/dL (ref 8.4–10.5)
Chloride: 99 mEq/L (ref 96–112)
Creatinine, Ser: 0.75 mg/dL (ref 0.40–1.20)
GFR: 97.9 mL/min (ref >60.00)
Glucose, Bld: 181 mg/dL — ABNORMAL HIGH (ref 70–99)
Potassium: 3.7 mEq/L (ref 3.5–5.1)
Sodium: 136 mEq/L (ref 135–145)

## 2020-06-07 LAB — LIPID PANEL
Cholesterol: 174 mg/dL (ref 0–200)
HDL: 39.4 mg/dL (ref >39.00)
LDL Cholesterol: 108 mg/dL — ABNORMAL HIGH (ref 0–99)
NonHDL: 134.98
Total CHOL/HDL Ratio: 4
Triglycerides: 134 mg/dL (ref 0.0–149.0)
VLDL: 26.8 mg/dL (ref 0.0–40.0)

## 2020-06-07 LAB — MICROALBUMIN / CREATININE URINE RATIO
Creatinine,U: 183.8 mg/dL
Microalb Creat Ratio: 0.6 mg/g (ref 0.0–30.0)
Microalb, Ur: 1.2 mg/dL (ref 0.0–1.9)

## 2020-06-07 MED ORDER — ATORVASTATIN CALCIUM 20 MG PO TABS: 20.0000 mg | ORAL_TABLET | Freq: Every day | ORAL | 3 refills | Status: AC

## 2020-06-13 ENCOUNTER — Other Ambulatory Visit: Payer: Self-pay | Admitting: Internal Medicine

## 2020-06-13 ENCOUNTER — Encounter: Payer: Self-pay | Admitting: Internal Medicine

## 2020-06-13 MED ORDER — INSULIN GLARGINE-YFGN 100 UNIT/ML ~~LOC~~ SOPN: 32.0000 [IU] | PEN_INJECTOR | Freq: Every day | SUBCUTANEOUS | 6 refills | Status: AC

## 2020-06-24 ENCOUNTER — Encounter (INDEPENDENT_AMBULATORY_CARE_PROVIDER_SITE_OTHER): Payer: Self-pay

## 2020-07-24 ENCOUNTER — Other Ambulatory Visit: Payer: Self-pay | Admitting: Internal Medicine

## 2020-07-24 ENCOUNTER — Other Ambulatory Visit: Payer: Self-pay | Admitting: Family Medicine

## 2020-07-24 NOTE — Telephone Encounter (Signed)
Sees endo. 

## 2020-09-09 LAB — HM DIABETES EYE EXAM

## 2020-09-11 ENCOUNTER — Encounter: Payer: Self-pay | Admitting: Internal Medicine

## 2020-09-12 ENCOUNTER — Encounter: Payer: Self-pay | Admitting: Internal Medicine

## 2020-10-10 ENCOUNTER — Ambulatory Visit: Payer: BC Managed Care – PPO | Admitting: Internal Medicine

## 2020-11-14 ENCOUNTER — Ambulatory Visit: Payer: BC Managed Care – PPO | Admitting: Internal Medicine

## 2020-12-19 ENCOUNTER — Ambulatory Visit: Payer: BC Managed Care – PPO | Admitting: Internal Medicine

## 2020-12-19 NOTE — Progress Notes (Deleted)
Name: Amy Banks  Age/ Sex: 45 y.o., female   MRN/ DOB: [id number], 1975-06-26     PCP: [name], NP-C   Reason for Endocrinology Evaluation: Type 2 Diabetes Mellitus  Initial Endocrine Consultative Visit: 11/24/2019    PATIENT IDENTIFIER: [name] is a 45 y.o. female with a past medical history of HTN, T2DM and Dyslipidemia. The patient has followed with Endocrinology clinic since 11/24/2019 for consultative assistance with management of her diabetes.  DIABETIC HISTORY:  Amy Banks was diagnosed with DM many years ago, Trulicity - slight headaches. Higher doses of metformin cause vomiting. Her hemoglobin A1c has ranged from 9.0% in 2018, peaking at 11.7%  in 2021  On her initial visit to our clinic she had an A1c of 11.7%. She was on Metformin and Lantus. We restarted Trulicity  SUBJECTIVE:   During the last visit (06/06/2020): A1c 8.0%.  continued metformin and Increased  trulicity     Today (8/75/6433): Amy Banks is here for a follow up on diabetes management.  She checks her blood sugars 0 times daily. The patient has not had hypoglycemic episodes since the last clinic visit.  Has occasional nausea but no diarrhea , has occasional heartburn       HOME ENDOCRINE REGIMEN:  Lantus 32 units daily  Metformin 295 mg daily  Trulicity 1.5  mg weekly  Atorvastatin 10 mg daily     Statin: Yes  ACE-I/ARB: Yes    METER DOWNLOAD SUMMARY: did not bring      DIABETIC COMPLICATIONS: Microvascular complications:    Denies: CKD, retinopathy , neuropathy  Last eye exam: Completed 08/2019   Macrovascular complications:    Denies: CAD, PVD, CVA   HISTORY:  Past Medical History:  Past Medical History:  Diagnosis Date   Anemia    Anxiety    Depression    Diabetes mellitus without complication ([place])    Diverticulitis 08/2013   Fibroids    tx w/ hormone meds   [facility] of blood transfusion 03/2016   at [facility]   Hypertension     Infertility, female    on hormone meds   Morbid obesity [facility])    Past Surgical History:  Past Surgical History:  Procedure Laterality Date   BREAST SURGERY  2001   reduction   LAPAROTOMY Right 01/02/2015   Procedure: EXPLORATORY LAPAROTOMY WITH RIGHT OVARIAN CYSTECTOMY;  Surgeon: Marylynn Pearson, MD;  Location: [facility];  Service: Gynecology;  Laterality: Right;   LAPAROTOMY N/A 06/13/2017   Procedure: MINI LAPAROTOMY;  Surgeon: Governor Specking, MD;  Location: [facility];  Service: Gynecology;  Laterality: N/A;   LYSIS OF ADHESION N/A 01/02/2015   Procedure: LYSIS OF ADHESION;  Surgeon: Marylynn Pearson, MD;  Location: [facility];  Service: Gynecology;  Laterality: N/A;   LYSIS OF ADHESION N/A 06/13/2017   Procedure: LYSIS OF ADHESION;  Surgeon: Governor Specking, MD;  Location: [facility];  Service: Gynecology;  Laterality: N/A;   MYOMECTOMY  2009   ROBOT ASSISTED MYOMECTOMY N/A 06/13/2017   Procedure: ATTEMPTED ROBOTIC ASSISTED MYOMECTOMY;  Surgeon: Governor Specking, MD;  Location: [facility];  Service: Gynecology;  Laterality: N/A;   Social History:  reports that she has never smoked. She has never used smokeless tobacco. She reports that she does not drink alcohol and does not use drugs. Family History:  Family History  Problem Relation Age of Onset   Diabetes Mother    Hypertension Mother    Diabetes Sister    Diabetes Maternal Grandmother  Hypertension Maternal Grandmother    Heart disease Maternal Grandmother    Hypertension Other    Diabetes Other    Breast cancer Other    Ovarian cancer Other    Cancer Maternal Aunt    Heart disease Maternal Grandfather      HOME MEDICATIONS: Allergies as of 12/19/2020       Reactions   Codeine Hives, Itching        Medication List        Accurate as of December 19, 2020 11:35 AM. If you have any questions, ask your nurse or doctor.          Accu-Chek FastClix Lancets Misc by Does not apply route.   Accu-Chek Guide test strip Generic  drug: glucose blood 1 each by Other route as needed for other. Use as instructed   Accu-Chek Guide w/Device Kit by Does not apply route.   amLODipine 10 MG tablet Commonly known as: NORVASC Take 1 tablet (10 mg total) by mouth daily.   atorvastatin 20 MG tablet Commonly known as: LIPITOR Take 1 tablet (20 mg total) by mouth daily.   Dexcom G6 Sensor Misc 1 Device by Does not apply route as directed.   Dexcom G6 Transmitter Misc 1 Device by Does not apply route as directed.   insulin glargine-yfgn 100 UNIT/ML Sopn Commonly known as: Semglee (yfgn) Inject 32 Units into the skin daily.   Insulin Pen Needle 32G X 4 MM Misc 1 Device by Does not apply route daily.   losartan-hydrochlorothiazide 100-12.5 MG tablet Commonly known as: HYZAAR Take 1 tablet by mouth daily.   metFORMIN 750 MG 24 hr tablet Commonly known as: GLUCOPHAGE-XR TAKE 1 TABLET(750 MG) BY MOUTH DAILY WITH BREAKFAST   miconazole nitrate Powd Commonly known as: MICATIN Apply 1 application topically 2 (two) times daily as needed.   Trulicity 1.5 LZ/7.6BH Sopn Generic drug: Dulaglutide Inject 0.5 mLs (1.5 mg total) into the skin once a week. To replace the 0.75 mg dose   VITAMIN D PO Take 1 tablet by mouth daily. gummies         OBJECTIVE:   Vital Signs: There were no vitals taken for this visit.  Wt Readings from Last 3 Encounters:  06/06/20 (!) 338 lb 2 oz (153.4 kg)  05/03/20 (!) 334 lb 9.6 oz (151.8 kg)  04/12/20 (!) 320 lb (145.2 kg)     Exam: General: Pt appears well and is in NAD  Lungs: Clear with good BS bilat with no rales, rhonchi, or wheezes  Heart: RRR with normal S1 and S2 and no gallops; no murmurs; no rub  Abdomen: Normoactive bowel sounds, soft, nontender, without masses or organomegaly palpable  Extremities: No pretibial edema.   Neuro: MS is good with appropriate affect, pt is alert and Ox3    DM foot exam: 11/24/2019   The skin of the feet is intact without sores or  ulcerations. The pedal pulses are 2+ on right and 2+ on left. The sensation is intact to a screening 5.07, 10 gram monofilament bilaterally     DATA REVIEWED:  Lab Results  Component Value Date   HGBA1C 8.0 (A) 06/06/2020   HGBA1C 11.7 (H) 11/14/2019   HGBA1C 11.0 (H) 10/09/2018    Results for SHIRONDA, KAIN (MRN [id number]) as of 06/07/2020 14:13  Ref. Range 06/06/2020 15:37  Sodium Latest Ref Range: 135 - 145 mEq/L 136  Potassium Latest Ref Range: 3.5 - 5.1 mEq/L 3.7  Chloride Latest Ref Range: 96 -  112 mEq/L 99  CO2 Latest Ref Range: 19 - 32 mEq/L 25  Glucose Latest Ref Range: 70 - 99 mg/dL 181 (H)  BUN Latest Ref Range: 6 - 23 mg/dL 9  Creatinine Latest Ref Range: 0.40 - 1.20 mg/dL 0.75  Calcium Latest Ref Range: 8.4 - 10.5 mg/dL 9.9  GFR Latest Ref Range: >60.00 mL/min 97.90  Total CHOL/HDL Ratio Unknown 4  Cholesterol Latest Ref Range: 0 - 200 mg/dL 174  HDL Cholesterol Latest Ref Range: >39.00 mg/dL 39.40  LDL (calc) Latest Ref Range: 0 - 99 mg/dL 108 (H)  MICROALB/CREAT RATIO Latest Ref Range: 0.0 - 30.0 mg/g 0.6  NonHDL Unknown 134.98  Triglycerides Latest Ref Range: 0.0 - 149.0 mg/dL 134.0  VLDL Latest Ref Range: 0.0 - 40.0 mg/dL 26.8  Creatinine,U Latest Units: mg/dL 183.8  Microalb, Ur Latest Ref Range: 0.0 - 1.9 mg/dL 1.2    ASSESSMENT / PLAN / RECOMMENDATIONS:   1) Type 2 Diabetes Mellitus, Improving Glycemic control, Without complications - Most recent A1c of 8.0 %. Goal A1c < 7.0 %.   - I have congratulated her on the dramatic improvement in glycemic control, A1c down from 11.7 % to 8.0 %  - We discussed regular exercise which she is motivated to do  -  Intolerant to higher doses of metformin  - No changes today  - After some hesitation on her part she agreed to try Dexcom , if its covered by her insurance  MEDICATIONS: - Continue  Lantus 32 units daily  - Continue Metformin 750 mg daily   - Increase Trulicity 1.5 mg weekly    EDUCATION /  INSTRUCTIONS: BG monitoring instructions: Patient is instructed to check her blood sugars 1 times a day Call [place] [facility] if: BG persistently < 70  I reviewed the Rule of 15 for the treatment of hypoglycemia in detail with the patient. Literature supplied.   2) Diabetic complications:  Eye: Does not have known diabetic retinopathy.  Neuro/ Feet: Does not have known diabetic peripheral neuropathy .  Renal: Patient does not have known baseline CKD. She   is  on an ACEI/ARB at present. Normal microalbuminuria    3) Dyslipidemia :   - Tolerating Atorvastatin without sie effects, repeat lipid profile continues to show high LDL , will increase the dose as below     Medication  Increase Atorvastatin to 20  mg daily     F/U in 3 months    Signed electronically by: Mack Guise, MD  [facility] Endocrinology  [address] [place], [place] [address] Phone: [phone] FAX: [phone]   CC: [name], NP-C [address] [address] Phone: [phone]  Fax: [phone]  Return to Endocrinology clinic as below: Future Appointments  Date Time Provider [place]  12/19/2020  2:00 PM Domonique Cothran, Melanie Crazier, MD [facility] [place]

## 2021-02-14 ENCOUNTER — Other Ambulatory Visit: Payer: Self-pay

## 2021-02-14 DIAGNOSIS — I10 Essential (primary) hypertension: Secondary | ICD-10-CM

## 2021-02-15 DIAGNOSIS — I10 Essential (primary) hypertension: Secondary | ICD-10-CM

## 2021-02-15 MED ORDER — AMLODIPINE BESYLATE 10 MG PO TABS: 10.0000 mg | ORAL_TABLET | Freq: Every day | ORAL | 0 refills | Status: DC

## 2021-05-11 ENCOUNTER — Other Ambulatory Visit: Payer: Self-pay | Admitting: Cardiovascular Disease

## 2021-05-11 DIAGNOSIS — I10 Essential (primary) hypertension: Secondary | ICD-10-CM

## 2021-05-19 ENCOUNTER — Other Ambulatory Visit: Payer: Self-pay | Admitting: Cardiovascular Disease

## 2021-05-19 DIAGNOSIS — I10 Essential (primary) hypertension: Secondary | ICD-10-CM

## 2021-08-07 IMAGING — CR DG CHEST 2V
2 series · 2 of 2 positions shown · non-contrast
Comparison: September 06, 2016

CLINICAL DATA: Chest pain.  Hypertension.

EXAM:
CHEST - 2 VIEW

[chest pa]
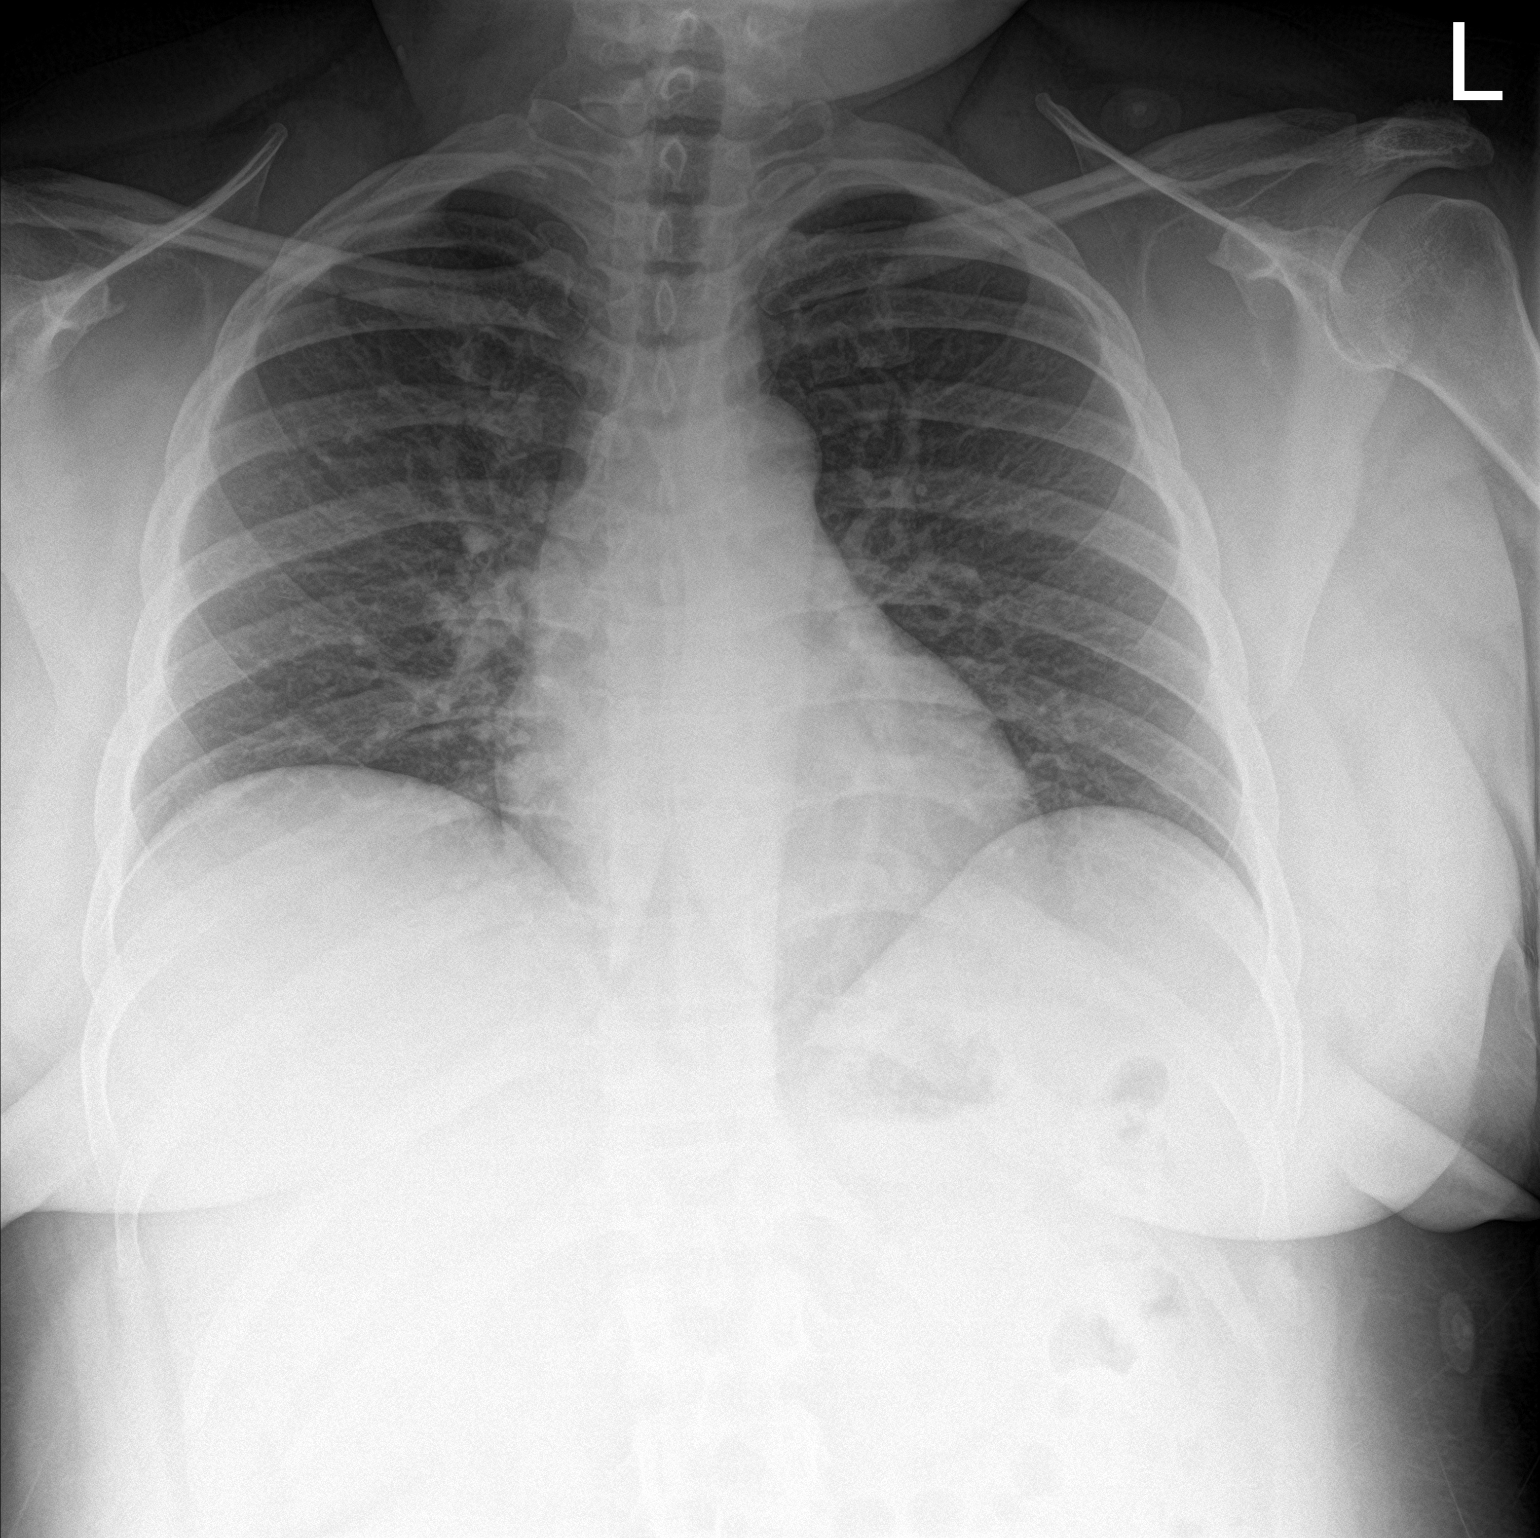

[chest lat]
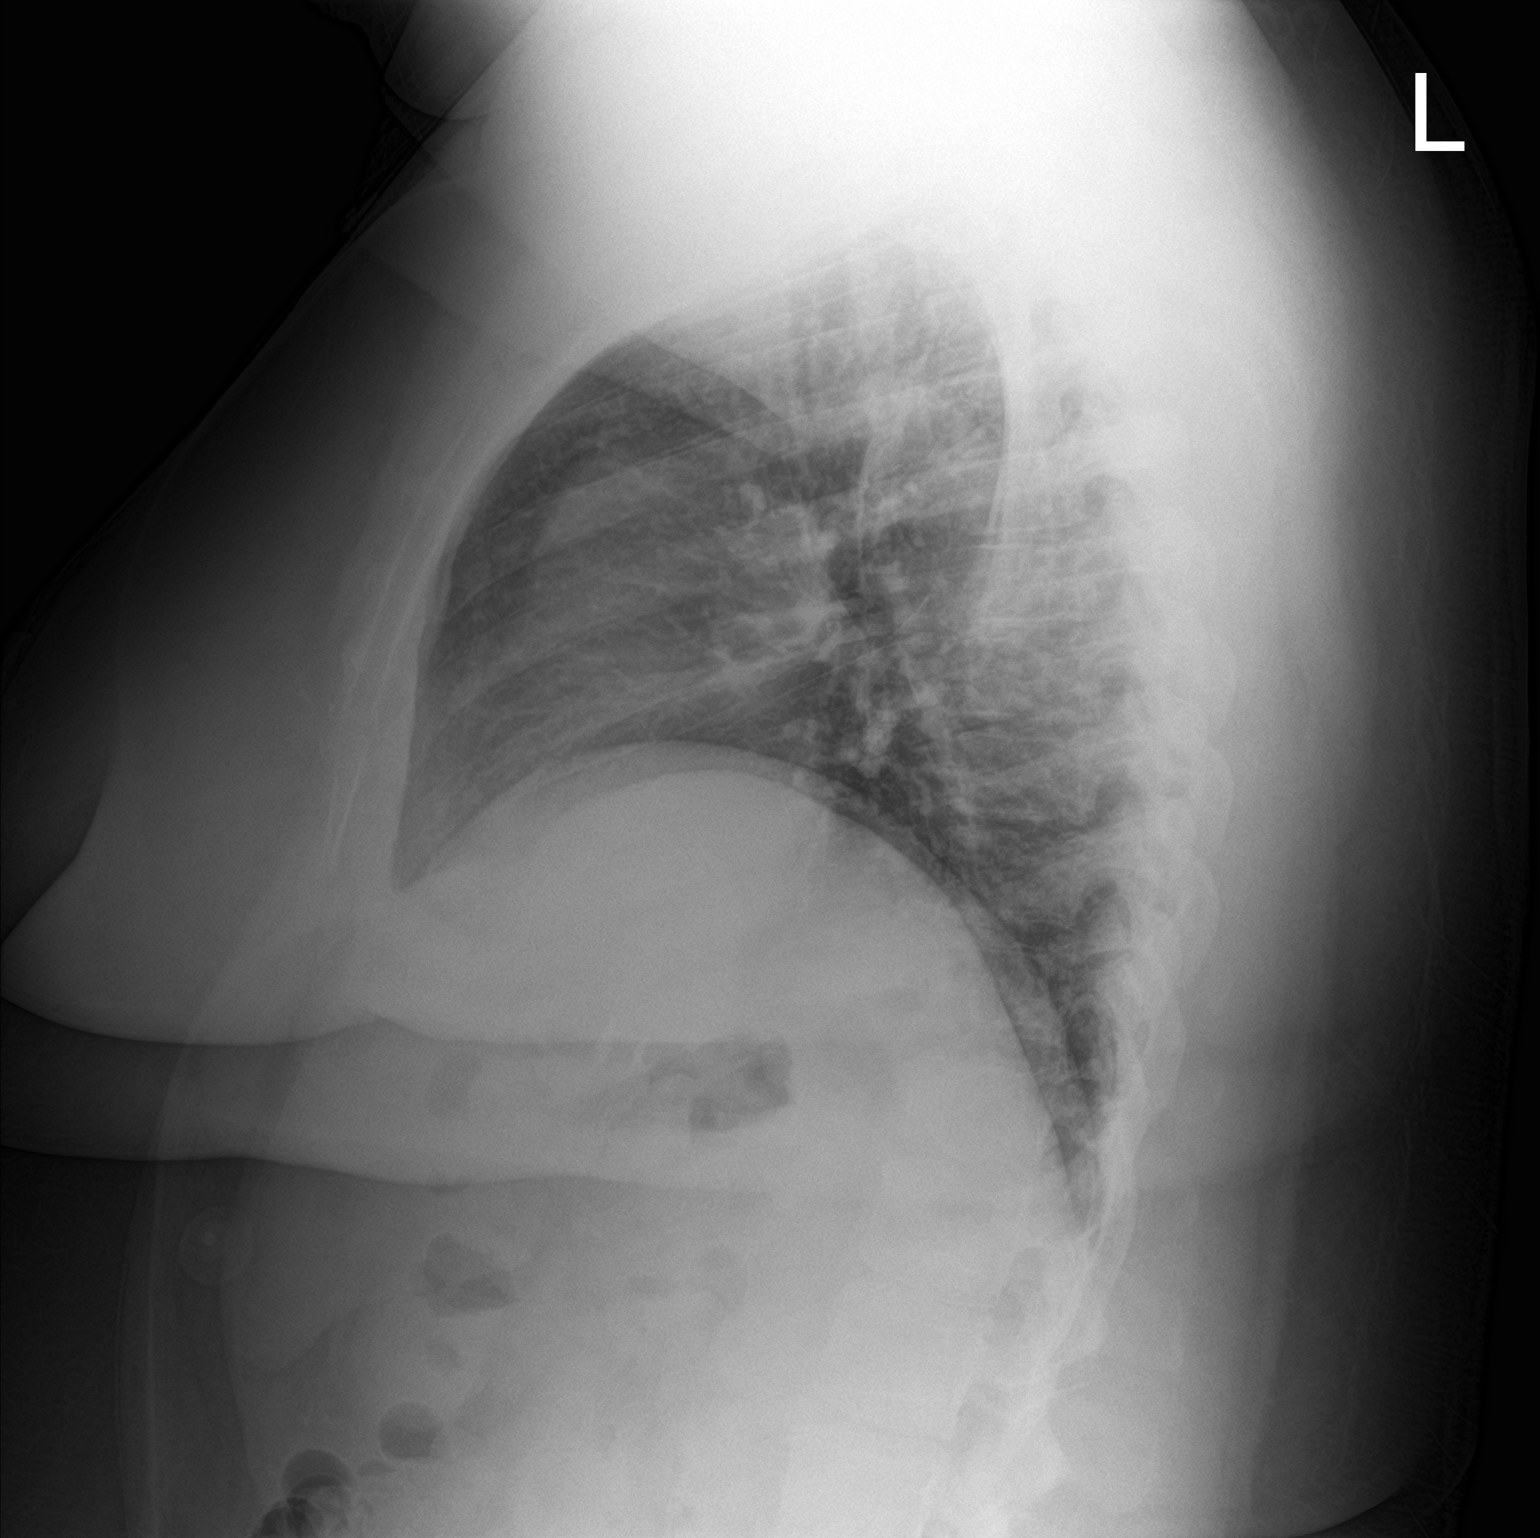

[2 of 2 positions shown; findings below may reference images not displayed]

FINDINGS: The lungs are clear. The heart size and pulmonary vascularity are
normal. No adenopathy. No pneumothorax. No bone lesions.
IMPRESSION: Lungs clear.  Cardiac silhouette normal.

## 2021-08-07 IMAGING — CT CT HEAD W/O CM
4 series · 16 of 47 positions shown, 18 images · non-contrast
Comparison: 05/18/2015 brain MRI.

CLINICAL DATA: Mental status change. Hypertensive emergency.
Headache. No reported injury.

EXAM:
CT HEAD WITHOUT CONTRAST
TECHNIQUE: Contiguous axial images were obtained from the base of the skull
through the vertex without intravenous contrast.

[Series 3: head without · axial · non-contrast · 0.46mm/px · z∈[+1316,+1431]mm · 7 of 31 slices shown, 9 images]
[im 4/31  brain]
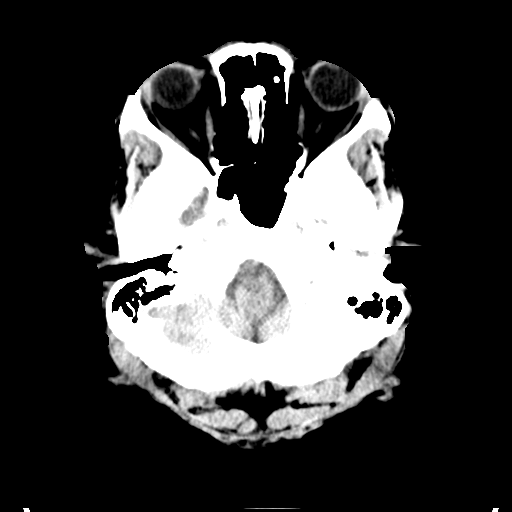
[im 4/31  bone]
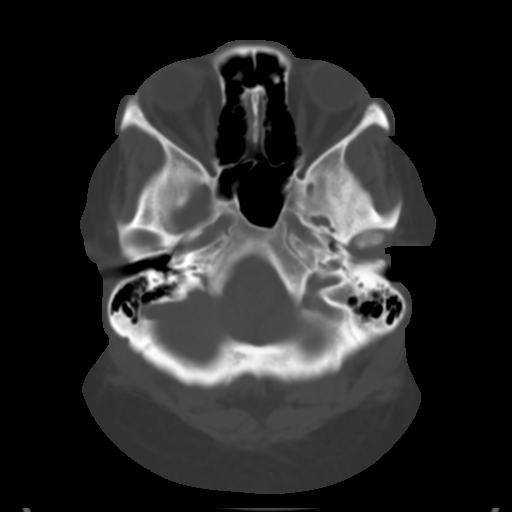
[im 8/31  brain]
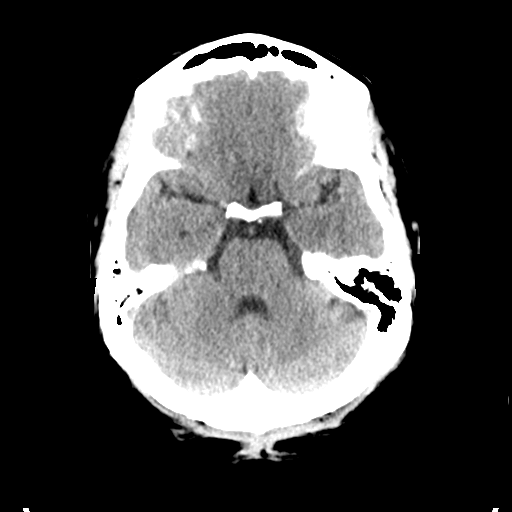
[im 12/31  brain]
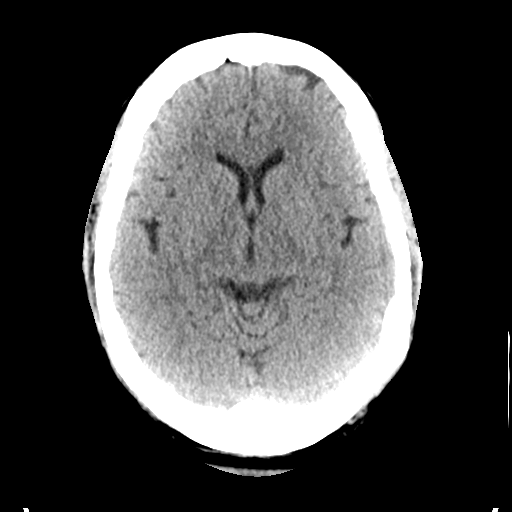
[im 16/31  brain]
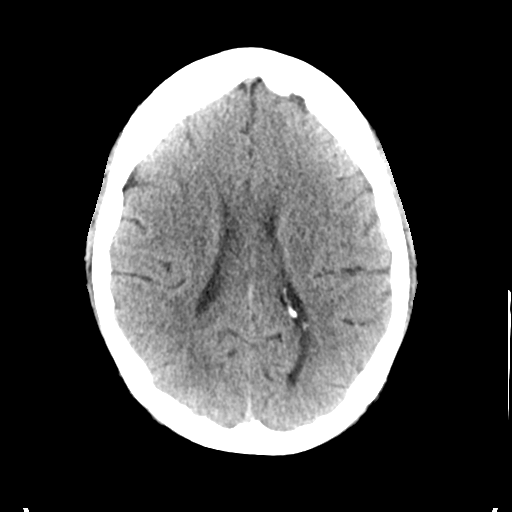
[im 19/31  brain]
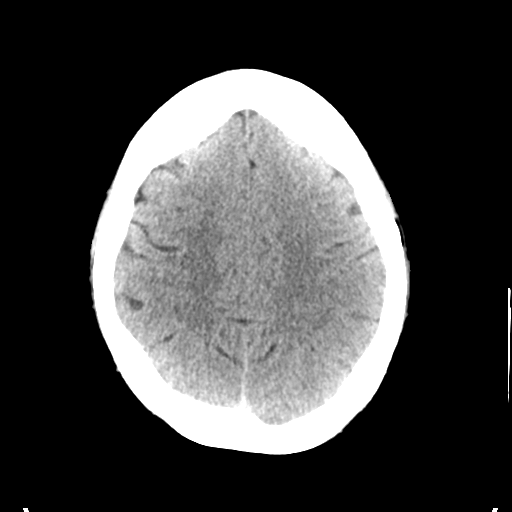
[im 19/31  bone]
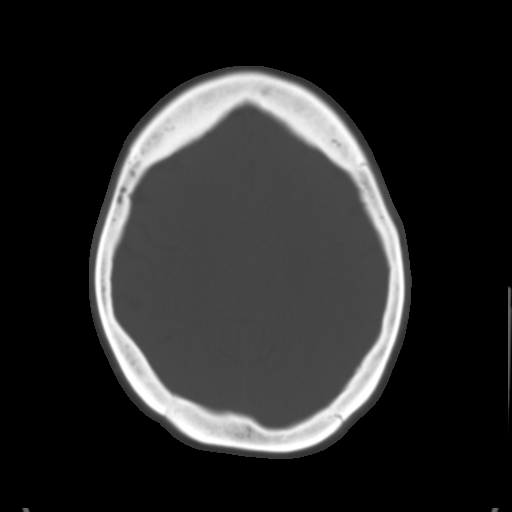
[im 23/31  brain]
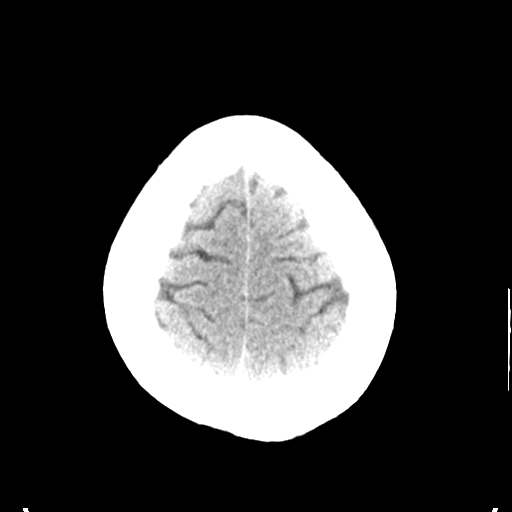
[im 27/31  brain]
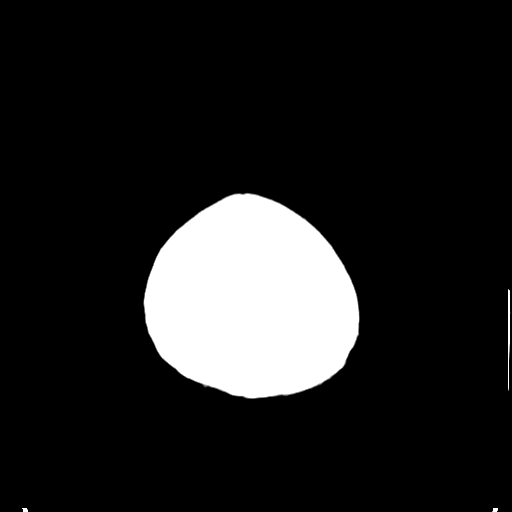

[Series 4: head bone · axial · 0.46mm/px · z∈[+1315,+1345]mm · 3 of 76 slices shown]
[im 8/76  bone]
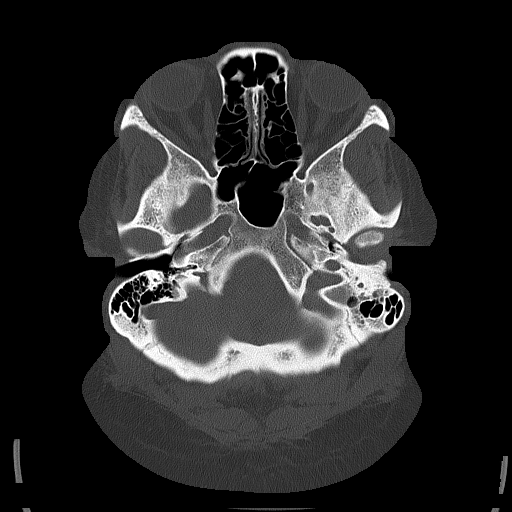
[im 16/76  bone]
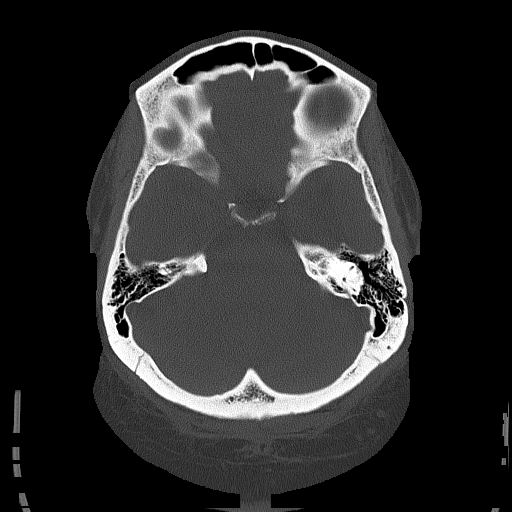
[im 23/76  bone]
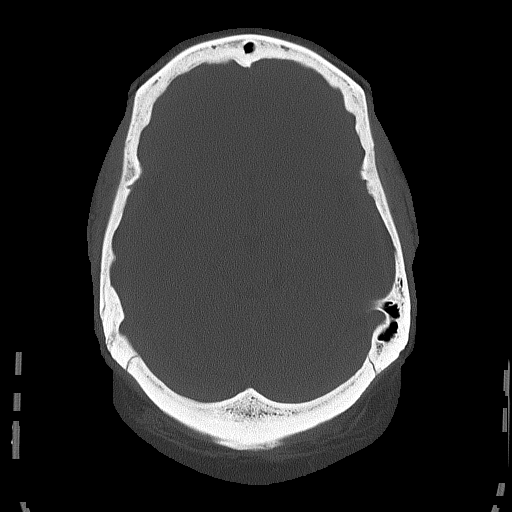

[Series 5: head without cor · coronal · non-contrast · 0.32mm/px · 3 of 69 slices shown]
[im 23/69  brain]
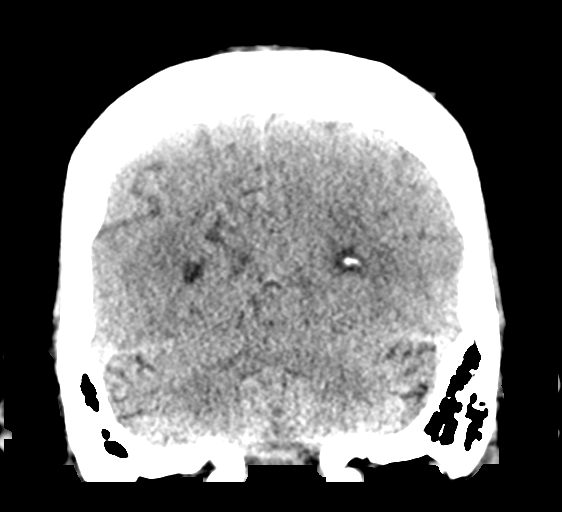
[im 31/69  brain]
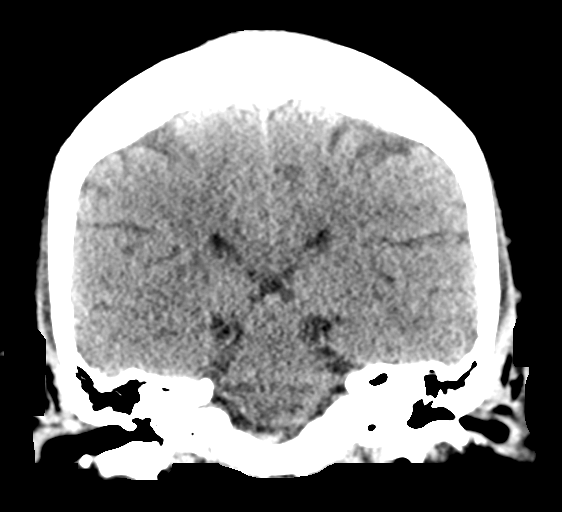
[im 38/69  brain]
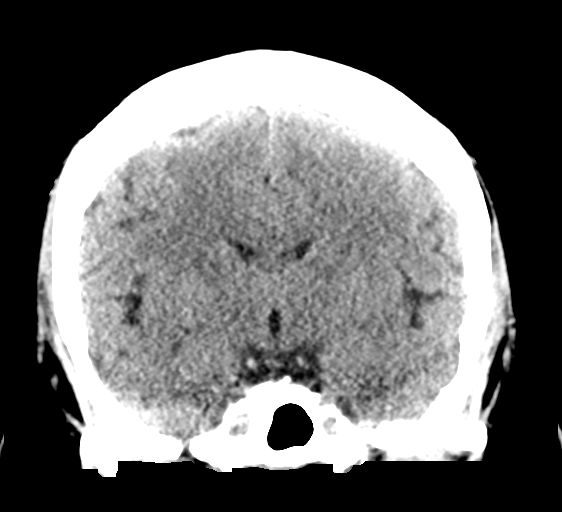

[Series 6: head without sag · sagittal · non-contrast · 0.33mm/px · 3 of 58 slices shown]
[im 20/58  brain]
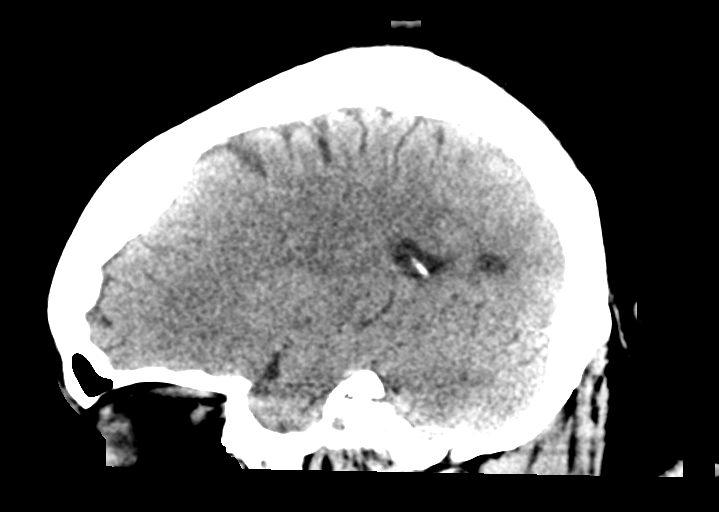
[im 29/58  brain]
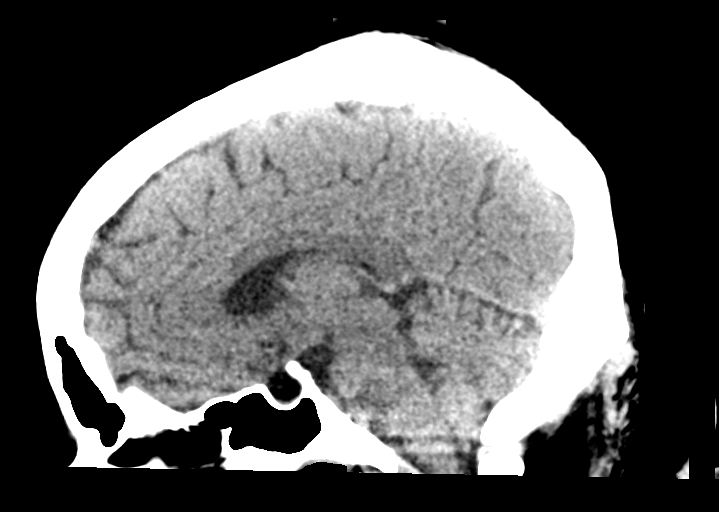
[im 39/58  brain]
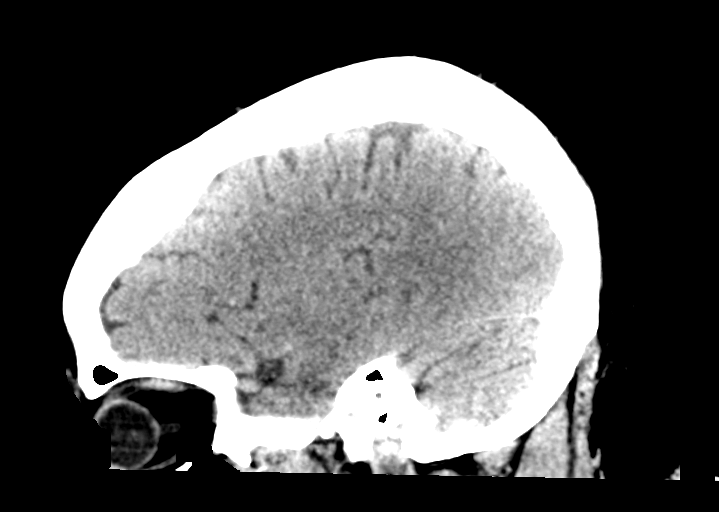

[16 of 47 positions shown; findings below may reference images not displayed]

FINDINGS: Brain: No evidence of parenchymal hemorrhage or extra-axial fluid
collection. No mass lesion, mass effect, or midline shift. No CT
evidence of acute infarction. Cerebral volume is age appropriate. No
ventriculomegaly.

Vascular: No acute abnormality.

Skull: No evidence of calvarial fracture.

Sinuses/Orbits: The visualized paranasal sinuses are essentially
clear.

Other:  The mastoid air cells are unopacified.
IMPRESSION: Negative head CT. No evidence of acute intracranial abnormality.

## 2021-08-31 ENCOUNTER — Other Ambulatory Visit: Payer: Self-pay | Admitting: Internal Medicine

## 2021-08-31 DIAGNOSIS — E1165 Type 2 diabetes mellitus with hyperglycemia: Secondary | ICD-10-CM

## 2022-01-09 ENCOUNTER — Encounter (INDEPENDENT_AMBULATORY_CARE_PROVIDER_SITE_OTHER): Payer: Self-pay

## 2022-04-17 ENCOUNTER — Encounter: Payer: Self-pay | Admitting: Internal Medicine

## 2022-10-18 ENCOUNTER — Telehealth: Payer: Self-pay

## 2022-10-18 ENCOUNTER — Telehealth: Payer: Self-pay | Admitting: Family Medicine

## 2022-10-18 NOTE — Transitions of Care (Post Inpatient/ED Visit) (Unsigned)
   10/18/2022  Name: TENISA FLEETWOOD MRN: [id number] DOB: 1976/03/31  Today's TOC FU Call Status:  Patient longer is located in [place].  Attempted to reach the patient regarding the most recent Inpatient/ED visit.  Follow Up Plan: No further outreach attempts will be made at this time. We have been unable to contact the patient.  Signature  Dafna Romo,CMA [facility] [place] [facility]

## 2022-10-18 NOTE — Telephone Encounter (Signed)
Noted. Looks like it was a TOC call

## 2022-10-18 NOTE — Telephone Encounter (Signed)
Patient received a call from [facility] and wanted to inform us that she no longer lives in [place].
